# Patient Record
Sex: Male | Born: 1980 | Race: Black or African American | Hispanic: No | Marital: Single | Smoking: Current some day smoker
Health system: Southern US, Community
[De-identification: ages and names within clinical notes are randomized; demographics above are authoritative.]

## PROBLEM LIST (undated history)

## (undated) DIAGNOSIS — I10 Essential (primary) hypertension: Secondary | ICD-10-CM

## (undated) DIAGNOSIS — A539 Syphilis, unspecified: Secondary | ICD-10-CM

## (undated) DIAGNOSIS — B20 Human immunodeficiency virus [HIV] disease: Secondary | ICD-10-CM

## (undated) DIAGNOSIS — K6289 Other specified diseases of anus and rectum: Secondary | ICD-10-CM

## (undated) HISTORY — DX: Syphilis, unspecified: A53.9

---

## 1999-08-03 ENCOUNTER — Emergency Department (HOSPITAL_COMMUNITY): Admission: EM | Admit: 1999-08-03 | Discharge: 1999-08-03 | Payer: Self-pay | Admitting: Emergency Medicine

## 2004-05-13 ENCOUNTER — Emergency Department (HOSPITAL_COMMUNITY): Admission: EM | Admit: 2004-05-13 | Discharge: 2004-05-13 | Payer: Self-pay | Admitting: Emergency Medicine

## 2006-06-19 ENCOUNTER — Emergency Department (HOSPITAL_COMMUNITY): Admission: EM | Admit: 2006-06-19 | Discharge: 2006-06-19 | Payer: Self-pay | Admitting: Emergency Medicine

## 2006-06-20 ENCOUNTER — Emergency Department (HOSPITAL_COMMUNITY): Admission: EM | Admit: 2006-06-20 | Discharge: 2006-06-20 | Payer: Self-pay | Admitting: Emergency Medicine

## 2006-06-22 ENCOUNTER — Emergency Department (HOSPITAL_COMMUNITY): Admission: EM | Admit: 2006-06-22 | Discharge: 2006-06-22 | Payer: Self-pay | Admitting: Emergency Medicine

## 2006-08-12 ENCOUNTER — Emergency Department (HOSPITAL_COMMUNITY): Admission: EM | Admit: 2006-08-12 | Discharge: 2006-08-12 | Payer: Self-pay | Admitting: Emergency Medicine

## 2006-09-10 ENCOUNTER — Emergency Department (HOSPITAL_COMMUNITY): Admission: EM | Admit: 2006-09-10 | Discharge: 2006-09-10 | Payer: Self-pay | Admitting: Emergency Medicine

## 2006-12-14 ENCOUNTER — Emergency Department (HOSPITAL_COMMUNITY): Admission: EM | Admit: 2006-12-14 | Discharge: 2006-12-14 | Payer: Self-pay | Admitting: Emergency Medicine

## 2007-01-22 ENCOUNTER — Emergency Department (HOSPITAL_COMMUNITY): Admission: EM | Admit: 2007-01-22 | Discharge: 2007-01-22 | Payer: Self-pay | Admitting: Emergency Medicine

## 2007-06-06 ENCOUNTER — Emergency Department (HOSPITAL_COMMUNITY): Admission: EM | Admit: 2007-06-06 | Discharge: 2007-06-06 | Payer: Self-pay | Admitting: Emergency Medicine

## 2008-03-27 ENCOUNTER — Emergency Department (HOSPITAL_COMMUNITY): Admission: EM | Admit: 2008-03-27 | Discharge: 2008-03-27 | Payer: Self-pay | Admitting: Emergency Medicine

## 2009-02-17 ENCOUNTER — Ambulatory Visit: Payer: Self-pay | Admitting: Internal Medicine

## 2009-02-25 ENCOUNTER — Encounter: Payer: Self-pay | Admitting: Internal Medicine

## 2009-04-20 ENCOUNTER — Ambulatory Visit: Payer: Self-pay | Admitting: Internal Medicine

## 2009-04-20 LAB — CONVERTED CEMR LAB
ALT: 10 units/L (ref 0–53)
AST: 13 units/L (ref 0–37)
Absolute CD4: 816 #/uL (ref 381–1469)
Albumin: 3.8 g/dL (ref 3.5–5.2)
Alkaline Phosphatase: 48 units/L (ref 39–117)
BUN: 9 mg/dL (ref 6–23)
Basophils Absolute: 0 10*3/uL (ref 0.0–0.1)
Basophils Relative: 0 % (ref 0–1)
CD4 T Helper %: 31 % — ABNORMAL LOW (ref 32–62)
CO2: 29 meq/L (ref 19–32)
Calcium: 9.3 mg/dL (ref 8.4–10.5)
Chloride: 101 meq/L (ref 96–112)
Creatinine, Ser: 0.79 mg/dL (ref 0.40–1.50)
Eosinophils Absolute: 0.2 10*3/uL (ref 0.0–0.7)
Eosinophils Relative: 3 % (ref 0–5)
Glucose, Bld: 159 mg/dL — ABNORMAL HIGH (ref 70–99)
HCT: 44.4 % (ref 39.0–52.0)
HCV Ab: NEGATIVE
HIV 1 RNA Quant: 2480 copies/mL — ABNORMAL HIGH (ref <48)
HIV-1 RNA Quant, Log: 3.39 — ABNORMAL HIGH (ref <1.68)
Hemoglobin: 14.5 g/dL (ref 13.0–17.0)
Hep A IgM: NEGATIVE
Hep B S Ab: NEGATIVE
Lymphocytes Relative: 47 % — ABNORMAL HIGH (ref 12–46)
Lymphs Abs: 2.6 10*3/uL (ref 0.7–4.0)
MCHC: 32.7 g/dL (ref 30.0–36.0)
MCV: 87.6 fL (ref 78.0–100.0)
Monocytes Absolute: 0.7 10*3/uL (ref 0.1–1.0)
Monocytes Relative: 13 % — ABNORMAL HIGH (ref 3–12)
Neutro Abs: 2 10*3/uL (ref 1.7–7.7)
Neutrophils Relative %: 36 % — ABNORMAL LOW (ref 43–77)
Platelets: 259 10*3/uL (ref 150–400)
Potassium: 4 meq/L (ref 3.5–5.3)
RBC: 5.07 M/uL (ref 4.22–5.81)
RDW: 12.6 % (ref 11.5–15.5)
Sodium: 137 meq/L (ref 135–145)
Total Bilirubin: 0.4 mg/dL (ref 0.3–1.2)
Total Protein: 7.6 g/dL (ref 6.0–8.3)
Total lymphocyte count: 2632 cells/mcL (ref 700–3300)
WBC: 5.6 10*3/uL (ref 4.0–10.5)

## 2009-05-06 ENCOUNTER — Encounter: Payer: Self-pay | Admitting: Internal Medicine

## 2009-05-06 ENCOUNTER — Ambulatory Visit: Payer: Self-pay | Admitting: Internal Medicine

## 2009-05-06 DIAGNOSIS — F329 Major depressive disorder, single episode, unspecified: Secondary | ICD-10-CM | POA: Insufficient documentation

## 2009-05-06 DIAGNOSIS — E119 Type 2 diabetes mellitus without complications: Secondary | ICD-10-CM

## 2009-05-06 DIAGNOSIS — I10 Essential (primary) hypertension: Secondary | ICD-10-CM

## 2009-05-06 DIAGNOSIS — B20 Human immunodeficiency virus [HIV] disease: Secondary | ICD-10-CM

## 2009-05-06 LAB — CONVERTED CEMR LAB: Blood Glucose, Fingerstick: 292

## 2009-05-07 ENCOUNTER — Encounter: Payer: Self-pay | Admitting: Internal Medicine

## 2009-05-07 LAB — CONVERTED CEMR LAB: Hgb A1c MFr Bld: 8.6 %

## 2009-12-02 ENCOUNTER — Telehealth: Payer: Self-pay | Admitting: Internal Medicine

## 2010-03-15 ENCOUNTER — Encounter: Payer: Self-pay | Admitting: Internal Medicine

## 2010-03-15 ENCOUNTER — Ambulatory Visit
Admission: RE | Admit: 2010-03-15 | Discharge: 2010-03-15 | Payer: Self-pay | Source: Home / Self Care | Attending: Internal Medicine | Admitting: Internal Medicine

## 2010-03-15 ENCOUNTER — Telehealth (INDEPENDENT_AMBULATORY_CARE_PROVIDER_SITE_OTHER): Payer: Self-pay | Admitting: *Deleted

## 2010-03-15 ENCOUNTER — Emergency Department (HOSPITAL_COMMUNITY)
Admission: EM | Admit: 2010-03-15 | Discharge: 2010-03-15 | Payer: Self-pay | Source: Home / Self Care | Admitting: Emergency Medicine

## 2010-03-15 LAB — CONVERTED CEMR LAB
ALT: 16 units/L (ref 0–53)
AST: 10 units/L (ref 0–37)
Albumin: 3.9 g/dL (ref 3.5–5.2)
Alkaline Phosphatase: 60 units/L (ref 39–117)
BUN: 14 mg/dL (ref 6–23)
Bacteria, UA: NONE SEEN
Basophils Absolute: 0 10*3/uL (ref 0.0–0.1)
Basophils Relative: 1 % (ref 0–1)
Bilirubin Urine: NEGATIVE
Blood, UA: NEGATIVE
CO2: 27 meq/L (ref 19–32)
Calcium: 9.3 mg/dL (ref 8.4–10.5)
Casts: NONE SEEN /lpf
Chlamydia, Swab/Urine, PCR: NEGATIVE
Chloride: 99 meq/L (ref 96–112)
Cholesterol: 120 mg/dL (ref 0–200)
Creatinine, Ser: 0.86 mg/dL (ref 0.40–1.50)
Eosinophils Absolute: 0.2 10*3/uL (ref 0.0–0.7)
Eosinophils Relative: 3 % (ref 0–5)
GC Probe Amp, Urine: NEGATIVE
Glucose, Bld: 319 mg/dL — ABNORMAL HIGH (ref 70–99)
HCT: 41.9 % (ref 39.0–52.0)
HCV Ab: NEGATIVE
HDL: 26 mg/dL — ABNORMAL LOW (ref >39)
HIV 1 RNA Quant: 37000 copies/mL — ABNORMAL HIGH (ref <20)
HIV-1 RNA Quant, Log: 4.57 — ABNORMAL HIGH (ref <1.30)
Hemoglobin: 13.9 g/dL (ref 13.0–17.0)
Hep A Total Ab: NEGATIVE
Hep B Core Total Ab: NEGATIVE
Hep B S Ab: NEGATIVE
Hepatitis B Surface Ag: NEGATIVE
Hgb A1c MFr Bld: 9 % — ABNORMAL HIGH (ref <5.7)
Ketones, ur: NEGATIVE mg/dL
LDL Cholesterol: 67 mg/dL (ref 0–99)
Leukocytes, UA: NEGATIVE
Lymphocytes Relative: 38 % (ref 12–46)
Lymphs Abs: 2.2 10*3/uL (ref 0.7–4.0)
MCHC: 33.2 g/dL (ref 30.0–36.0)
MCV: 85.9 fL (ref 78.0–100.0)
Monocytes Absolute: 0.5 10*3/uL (ref 0.1–1.0)
Monocytes Relative: 9 % (ref 3–12)
Neutro Abs: 2.9 10*3/uL (ref 1.7–7.7)
Neutrophils Relative %: 50 % (ref 43–77)
Nitrite: NEGATIVE
Platelets: 352 10*3/uL (ref 150–400)
Potassium: 4.5 meq/L (ref 3.5–5.3)
Protein, ur: NEGATIVE mg/dL
RBC: 4.88 M/uL (ref 4.22–5.81)
RDW: 12.6 % (ref 11.5–15.5)
RPR Ser Ql: REACTIVE — AB
RPR Titer: 1:32 {titer} — AB
Sodium: 134 meq/L — ABNORMAL LOW (ref 135–145)
Specific Gravity, Urine: 1.043 — ABNORMAL HIGH (ref 1.005–1.030)
Squamous Epithelial / HPF: NONE SEEN /lpf
T pallidum Antibodies (TP-PA): 4.08 — ABNORMAL HIGH (ref <0.90)
Total Bilirubin: 0.3 mg/dL (ref 0.3–1.2)
Total CHOL/HDL Ratio: 4.6
Total Protein: 8.3 g/dL (ref 6.0–8.3)
Triglycerides: 137 mg/dL (ref <150)
Urine Glucose: >1000 mg/dL — AB
Urobilinogen, UA: 0.2 (ref 0.0–1.0)
VLDL: 27 mg/dL (ref 0–40)
WBC: 5.8 10*3/uL (ref 4.0–10.5)
pH: 5.5 (ref 5.0–8.0)

## 2010-03-24 ENCOUNTER — Ambulatory Visit: Admit: 2010-03-24 | Payer: Self-pay | Admitting: Internal Medicine

## 2010-03-29 ENCOUNTER — Ambulatory Visit
Admission: RE | Admit: 2010-03-29 | Discharge: 2010-03-29 | Payer: Self-pay | Source: Home / Self Care | Attending: Internal Medicine | Admitting: Internal Medicine

## 2010-03-29 DIAGNOSIS — A539 Syphilis, unspecified: Secondary | ICD-10-CM | POA: Insufficient documentation

## 2010-04-05 ENCOUNTER — Ambulatory Visit
Admission: RE | Admit: 2010-04-05 | Discharge: 2010-04-05 | Payer: Self-pay | Source: Home / Self Care | Attending: Adult Health | Admitting: Adult Health

## 2010-04-12 ENCOUNTER — Ambulatory Visit
Admission: RE | Admit: 2010-04-12 | Discharge: 2010-04-12 | Payer: Self-pay | Source: Home / Self Care | Attending: Adult Health | Admitting: Adult Health

## 2010-04-12 ENCOUNTER — Encounter (INDEPENDENT_AMBULATORY_CARE_PROVIDER_SITE_OTHER): Payer: Self-pay | Admitting: *Deleted

## 2010-04-12 NOTE — Miscellaneous (Signed)
Summary: Office Visit ([facility])    Vital Signs:  Patient profile:   30 year old male Weight:      294 pounds Temp:     97.9 degrees F oral Pulse rate:   98 / minute Pulse rhythm:   regular Resp:     18 per minute BP sitting:   145 / 85  (left arm)  Vitals Entered By: Sharen Heck RN (May 06, 2009 9:47 AM) CC: 05 patient new to Dr. Philipp Deputy Is Patient Diabetic? Yes Did you bring your meter with you today? No CBG Result 292  Does patient need assistance? Functional Status Self care Ambulation Normal   CC:  05 patient new to Dr. Philipp Deputy.  History of Present Illness: Pt here to get established.  He tested positive for HIV [facility] the health dept[facility] 10/10.  He had never been tested before. His risk factor is MSM. His partner at the time was tested but he does not know the reuslts and they have since broken up.  He was also told that he had DM when he went to the ED but has never been in care for it. He is currently not on treatment for DM and does not have a glucometer. He also c/o depression with suicidal thoughts - no plan to act. He has lost his job recently and then lost his partner.  He would like to get into counseling.  Preventive Screening-Counseling & Management  Alcohol-Tobacco     Alcohol drinks/day: 0     Smoking Status: current     Packs/Day: occasional     Passive Smoke Exposure: Yes  Caffeine-Diet-Exercise     Caffeine use/day: 10     Caffeine Counseling: decrease use of caffeine     Does Patient Exercise: yes     Type of exercise: walking     Exercise (avg: min/session): >60     Times/week: 7  Comments: trying to get set up for depression counseling  Current Problems (verified): 1)  Hypertension  (ICD-401.9) 2)  HIV Disease  (ICD-042) 3)  Diabetes Mellitus, Type II  (ICD-250.00) 4)  Depression  (ICD-311) 5)  HIV Infection, Asymptomatic  (ICD-V08)  Current Medications (verified): 1)  Glucophage 500 Mg Tabs (Metformin Hcl) .... Take 1 Tablet By  Mouth Two Times A Day 2)  Lisinopril 20 Mg Tabs (Lisinopril) .... Take 1 Tablet By Mouth Once A Day  Allergies (verified): No Known Drug Allergies  Past History:  Past Medical History: Depression Diabetes mellitus, type II HIV disease Hypertension   Review of Systems  The patient denies anorexia, fever, and weight loss.         no diarrhea or night sweats   Physical Exam  General:  alert, well-developed, well-nourished, and well-hydrated.   Head:  normocephalic and atraumatic.   Mouth:  pharynx pink and moist.   Lungs:  normal breath sounds.             Prevention For Positives: 05/06/2009   Safe sex practices discussed with patient. Condoms offered.                             Impression & Recommendations:  Problem # 1:  HIV DISEASE (ICD-042) Discussed pathophysiology of HIV and the meaning of CD4ct and VL.  Pt.s current Cd4ct is  816 and VL is  2480 .  At this Point antiretroviral treatment is not needed .  Discussed safe sex and transmisiion routes  with the patient. Pt will return in 3 months for repeat labs.  Diagnostics Reviewed:  WBC: 5.6 (04/20/2009)   Hgb: 14.5 (04/20/2009)   HCT: 44.4 (04/20/2009)   Platelets: 259 (04/20/2009) HIV genotype: See Comment (04/20/2009)   HIV-1 RNA: 2480 (04/20/2009)     Problem # 2:  HYPERTENSION (ICD-401.9) will start lisinopril. His updated medication list for this problem includes:    Lisinopril 20 Mg Tabs (Lisinopril) .Marland Kitchen... Take 1 tablet by mouth once a day  BP today: 145/85  Labs Reviewed: K+: 4.0 (04/20/2009) Creat: : 0.79 (04/20/2009)     Problem # 3:  DIABETES MELLITUS, TYPE II (ICD-250.00) Will refer to DM educator and give him an Rx for a glucometer.  Will start glucophage. His updated medication list for this problem includes:    Glucophage 500 Mg Tabs (Metformin hcl) .Marland Kitchen... Take 1 tablet by mouth two times a day    Lisinopril 20 Mg Tabs (Lisinopril) .Marland Kitchen... Take 1 tablet by mouth once a day  Problem  # 4:  DEPRESSION (ICD-311) will refer to our [facility] counselor.  Medications Added to Medication List This Visit: 1)  Glucophage 500 Mg Tabs (Metformin hcl) .... Take 1 tablet by mouth two times a day 2)  Lisinopril 20 Mg Tabs (Lisinopril) .... Take 1 tablet by mouth once a day    Patient Instructions: 1)  Please schedule a follow-up appointment in 2 weeks. 2)  Please schedule with DM Educator Prescriptions: LISINOPRIL 20 MG TABS (LISINOPRIL) Take 1 tablet by mouth once a day  #30 x 5   Entered and Authorized by:   Yisroel Ramming MD   Signed by:   Yisroel Ramming MD on 05/06/2009   Method used:   Print then Give to Patient   RxID:   [id number] GLUCOPHAGE 500 MG TABS (METFORMIN HCL) Take 1 tablet by mouth two times a day  #60 x 5   Entered and Authorized by:   Yisroel Ramming MD   Signed by:   Yisroel Ramming MD on 05/06/2009   Method used:   Print then Give to Patient   RxID:   [id number]

## 2010-04-12 NOTE — Miscellaneous (Signed)
Summary: vaccines  Clinical Lists Changes  Orders: Added new Service order of Influenza Vaccine NON MCR (53664) - Signed Added new Service order of Pneumococcal Vaccine (40347) - Signed Added new Service order of Admin 1st Vaccine (42595) - Signed Added new Service order of Admin 1st Vaccine [facility]) [phone]) - Signed Observations: Added new observation of PNEUMOVAXVIS: 10/09/95 version given May 07, 2009. (05/07/2009 17:51) Added new observation of PNEUMOVAXLOT: 1028Z (05/07/2009 17:51) Added new observation of PNEUMOVAXEXP: 04/09/2010 (05/07/2009 17:51) Added new observation of PNEUMOVAXBY: Sharen Heck RN (05/07/2009 17:51) Added new observation of PNEUMOVAXRTE: IM (05/07/2009 17:51) Added new observation of PNEUMOVAXMFR: Merck (05/07/2009 17:51) Added new observation of PNEUMOVAXSIT: right deltoid (05/07/2009 17:51) Added new observation of PNEUMOVAX: Pneumovax (05/07/2009 17:51) Added new observation of FLU VAX#1VIS: 10/04/06 version given May 07, 2009. (05/07/2009 17:51) Added new observation of FLU VAXLOT: [id number] (05/07/2009 17:51) Added new observation of FLU VAX EXP: 09/09/2009 (05/07/2009 17:51) Added new observation of FLU VAXBY: Sharen Heck RN (05/07/2009 17:51) Added new observation of FLU VAXRTE: IM (05/07/2009 17:51) Added new observation of FLU VAX DSE: 0.5 ml (05/07/2009 17:51) Added new observation of FLU VAXMFR: Sanofi Pasteur (05/07/2009 17:51) Added new observation of FLU VAX SITE: left deltoid (05/07/2009 17:51) Added new observation of FLU VAX: Fluvax Non-MCR (05/07/2009 17:51) Added new observation of HGBA1C: 8.6 % (05/07/2009 17:51)     Laboratory Results   Blood Tests   Date/Time Received: 05/06/09  HGBA1C: 8.6%   (Normal Range: Non-Diabetic - 3-6%   Control Diabetic - 6-8%)    Vaccines given 05/06/09.   Pneumovax Vaccine    Vaccine Type: Pneumovax    Site: right deltoid    Mfr: Merck    Dose: 0.5 ml    Route: IM    Given by:  Sharen Heck RN    Exp. Date: 04/09/2010    Lot #: [id number]    VIS given: 10/09/95 version given May 07, 2009.  Influenza Vaccine    Vaccine Type: Fluvax Non-MCR    Site: left deltoid    Mfr: Sanofi Pasteur    Dose: 0.5 ml    Route: IM    Given by: Sharen Heck RN    Exp. Date: 09/09/2009    Lot #: [id number]    VIS given: 10/04/06 version given May 07, 2009.  Flu Vaccine Consent Questions    Do you have a history of severe allergic reactions to this vaccine? no    Any prior history of allergic reactions to egg and/or gelatin? no    Do you have a sensitivity to the preservative Thimersol? no    Do you have a past history of Guillan-Barre Syndrome? no    Do you currently have an acute febrile illness? no    Have you ever had a severe reaction to latex? no    Vaccine information given and explained to patient? yes

## 2010-04-12 NOTE — Progress Notes (Signed)
Summary: "bump" on scalp x 3 days, needs labs, OV, PPD & vaccines   Phone Note Call from Patient   Reason for Call: Acute Illness Action Taken: Phone Call Completed, Appt Scheduled Summary of Call: Has recently moved back home with his parents in [place].  Out of care since early 2011.  C/O "bump" on the back of his scalp x 3 days which he has tried to open.  Stated that he has had similar places on his face.  Needs  042 labs and return OV w/ Dr. Philipp Deputy.  Needs PPD and vaccine review to update [employer] criteria.  Jennet Maduro RN  December 02, 2009 2:21 PM

## 2010-04-14 NOTE — Miscellaneous (Signed)
Summary: Orders Update  Clinical Lists Changes  Orders: Added new Test order of T- Hemoglobin A1C (09811-91478) - Signed  Appended Document: Orders Update    Clinical Lists Changes  Orders: Added new Test order of T-Chlamydia  Probe, urine [phone]) - Signed Added new Test order of T-CBC w/Diff [phone]) - Signed Added new Test order of T-CD4SP [facility]) (CD4SP) - Signed Added new Test order of T-GC Probe, urine [phone]) - Signed Added new Test order of T-Comprehensive Metabolic Panel [phone]) - Signed Added new Test order of T-Hepatitis B Surface Antigen [phone]) - Signed Added new Test order of T-Hepatitis B Surface Antibody [phone]) - Signed Added new Test order of T-Hepatitis B Core Antibody ([id number]) - Signed Added new Test order of T-Hepatitis C Antibody (30160-10932) - Signed Added new Test order of T-Hepatitis A Antibody (35573-22025) - Signed Added new Test order of T-HIV1 Quant rflx Ultra or Genotype (42706-23762) - Signed Added new Test order of T-Lipid Profile (83151-76160) - Signed Added new Test order of T-RPR (Syphilis) (73710-62694) - Signed Added new Test order of T-Urinalysis (85462-70350) - Signed

## 2010-04-14 NOTE — Miscellaneous (Signed)
Summary: Lab Update  Clinical Lists Changes  Orders: Added new Test order of T-Chlamydia  Probe, urine [phone]) - Signed Added new Test order of T-CBC w/Diff [phone]) - Signed Added new Test order of T-CD4SP [facility]) (CD4SP) - Signed Added new Test order of T-GC Probe, urine [phone]) - Signed Added new Test order of T-Comprehensive Metabolic Panel [phone]) - Signed Added new Test order of T-Hepatitis B Surface Antigen [phone]) - Signed Added new Test order of T-Hepatitis B Surface Antibody ([phone]) - Signed Added new Test order of T-Hepatitis B Core Antibody ([id number]) - Signed Added new Test order of T-Hepatitis C Antibody (95188-41660) - Signed Added new Test order of T-Hepatitis A Antibody (63016-01093) - Signed Added new Test order of T-HIV1 Quant rflx Ultra or Genotype (23557-32202) - Signed Added new Test order of T-Lipid Profile (54270-62376) - Signed Added new Test order of T-RPR (Syphilis) (28315-17616) - Signed Added new Test order of T-Urinalysis (07371-06269) - Signed

## 2010-04-14 NOTE — Assessment & Plan Note (Signed)
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)    Route: IM    Site: RUOQ gluteus    Exp Date: 09/10/2012    Lot #: [id number]    Mfr: king    Patient tolerated injection without complications    Given by: [name] CMA Duncan Dull) (April 05, 2010 11:16 AM)  Injection # 2:    Medication: Bicillin LA 1.2 million units Injection    

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
## 2010-04-14 NOTE — Progress Notes (Signed)
Summary: Pt. diagnosed with Bells Palsy  Phone Note Call from Patient   Caller: Patient Summary of Call: Pt. came in for labs and the side of his face was drooping.  Tomasita Morrow, RN spoke with pt. and suggested he go to the ED to be evaluated for possible stroke.  Pt. called to notify he went to ED and was given dx of Bells Palsy and given medication.  Pt. has followup appt. with Dr. Philipp Deputy for 03/29/10. Initial call taken by: [name] CMA Duncan Dull),  March 15, 2010 4:27 PM

## 2010-04-20 NOTE — Miscellaneous (Signed)
Summary: RW Financial Update   Clinical Lists Changes  Observations: Added new observation of AIDSDAP: No-not currrently on hiv meds (04/12/2010 16:41) Added new observation of PCTFPL: 0  (04/12/2010 16:41) Added new observation of INCOMESOURCE: none  (04/12/2010 16:41) Added new observation of HOUSEINCOME: 0  (04/12/2010 16:41) Added new observation of FAMILYSIZE: 1  (04/12/2010 16:41) Added new observation of FINASSESSDT: 04/12/2010  (04/12/2010 16:41) Added new observation of YEARLYEXPEN: 0  (04/12/2010 16:41)

## 2010-05-23 LAB — T-HELPER CELL (CD4) - (RCID CLINIC ONLY)
CD4 % Helper T Cell: 27 % — ABNORMAL LOW (ref 33–55)
CD4 T Cell Abs: 620 uL (ref 400–2700)

## 2010-06-27 LAB — POCT I-STAT, CHEM 8
BUN: 7 mg/dL (ref 6–23)
Calcium, Ion: 1.15 mmol/L (ref 1.12–1.32)
Chloride: 98 mEq/L (ref 96–112)
Creatinine, Ser: 0.9 mg/dL (ref 0.4–1.5)
Glucose, Bld: 349 mg/dL — ABNORMAL HIGH (ref 70–99)
HCT: 47 % (ref 39.0–52.0)
Hemoglobin: 16 g/dL (ref 13.0–17.0)
Potassium: 3.9 mEq/L (ref 3.5–5.1)
Sodium: 139 mEq/L (ref 135–145)
TCO2: 29 mmol/L (ref 0–100)

## 2010-06-27 LAB — MONONUCLEOSIS SCREEN: Mono Screen: NEGATIVE

## 2010-06-27 LAB — STREP A DNA PROBE: Group A Strep Probe: NEGATIVE

## 2010-06-27 LAB — RAPID STREP SCREEN (MED CTR MEBANE ONLY): Streptococcus, Group A Screen (Direct): NEGATIVE

## 2010-07-29 NOTE — Consult Note (Signed)
NAMEPERCELL, LAMBOY              ACCOUNT NO.:  [id number]   MEDICAL RECORD NO.:  [id number]          PATIENT TYPE:  EMS   LOCATION:  [facility]                         FACILITY:  [facility]   PHYSICIAN:  Kristine Garbe. Ezzard Standing, M.D.DATE OF BIRTH:  Apr 12, 1980   DATE OF CONSULTATION:  06/20/2006  DATE OF DISCHARGE:                                 CONSULTATION   ER CONSULTATION   REASON FOR CONSULTATION:  Evaluate the patient with neck cellulitis,  questionable abscess.   BRIEF HISTORY AND PHYSICAL:  Alan Boyle is a 30 year old obese  gentleman who developed inflammation and soreness of the neck yesterday  and was started on doxycycline.  A CT scan yesterday showed mostly  subcutaneous cellulitis at the submental left mandibular region.  He  comes back today for recheck having the submental area is doing better,  but the left submandibular area is doing a little bit worse.   On examination, he has an open drain wound in the submental area  consistent with a draining abscess cellulitis in the left submental  area.  The left submandibular area is not opened and draining but is  indurated and tender.   IMPRESSION:  Cellulitis pearly subcutaneous abscess.   RECOMMENDATIONS:  Recommended an antibiotic and warm compresses.  Obtained a culture of the submental draining area in the ER today.  In  addition to doxycycline, will start him on Keflex 500 mg q.i.d.  Will  followup 48 hours for a recheck.           ______________________________  Kristine Garbe. Ezzard Standing, M.D.     CEN/MEDQ  D:  06/20/2006  T:  06/20/2006  Job:  [id number]

## 2010-12-20 LAB — CBC
HCT: 45.4
Hemoglobin: 15
MCHC: 33.1
MCV: 87.3
Platelets: 294
RBC: 5.2
RDW: 13.5
WBC: 8.9

## 2010-12-20 LAB — I-STAT 8, (EC8 V) (CONVERTED LAB)
Acid-Base Excess: 1
BUN: 9
Bicarbonate: 26.8 — ABNORMAL HIGH
Chloride: 101
Glucose, Bld: 239 — ABNORMAL HIGH
HCT: 49
Hemoglobin: 16.7
Operator id: 272551
Potassium: 3.9
Sodium: 137
TCO2: 28
pCO2, Ven: 46.9
pH, Ven: 7.366 — ABNORMAL HIGH

## 2010-12-20 LAB — DIFFERENTIAL
Basophils Absolute: 0
Basophils Relative: 0
Eosinophils Absolute: 0.3
Eosinophils Relative: 3
Lymphocytes Relative: 34
Lymphs Abs: 3
Monocytes Absolute: 0.8
Monocytes Relative: 9
Neutro Abs: 4.8
Neutrophils Relative %: 54

## 2010-12-20 LAB — POCT I-STAT CREATININE
Creatinine, Ser: 0.8
Operator id: 272551

## 2010-12-22 LAB — I-STAT 8, (EC8 V) (CONVERTED LAB)
Acid-Base Excess: 3 — ABNORMAL HIGH
BUN: 8
Bicarbonate: 27.8 — ABNORMAL HIGH
Chloride: 100
Glucose, Bld: 278 — ABNORMAL HIGH
HCT: 46
Hemoglobin: 15.6
Operator id: 277751
Potassium: 3.7
Sodium: 135
TCO2: 29
pCO2, Ven: 41.4 — ABNORMAL LOW
pH, Ven: 7.434 — ABNORMAL HIGH

## 2010-12-22 LAB — URINALYSIS, ROUTINE W REFLEX MICROSCOPIC
Bilirubin Urine: NEGATIVE
Glucose, UA: >1000 — AB
Hgb urine dipstick: NEGATIVE
Ketones, ur: 15 — AB
Leukocytes, UA: NEGATIVE
Nitrite: NEGATIVE
Protein, ur: NEGATIVE
Specific Gravity, Urine: 1.028
Urobilinogen, UA: 1
pH: 6.5

## 2010-12-22 LAB — URINE MICROSCOPIC-ADD ON

## 2010-12-22 LAB — POCT I-STAT CREATININE
Creatinine, Ser: 0.9
Operator id: 277751

## 2010-12-29 LAB — I-STAT 8, (EC8 V) (CONVERTED LAB)
Acid-Base Excess: 2
BUN: 5 — ABNORMAL LOW
Bicarbonate: 27.9 — ABNORMAL HIGH
Chloride: 103
Glucose, Bld: 227 — ABNORMAL HIGH
HCT: 45
Hemoglobin: 15.3
Operator id: 196461
Potassium: 3.7
Sodium: 139
TCO2: 29
pCO2, Ven: 45.7
pH, Ven: 7.393 — ABNORMAL HIGH

## 2010-12-29 LAB — POCT I-STAT CREATININE
Creatinine, Ser: 0.8
Operator id: 196461

## 2011-07-30 ENCOUNTER — Encounter (HOSPITAL_COMMUNITY): Payer: Self-pay | Admitting: Emergency Medicine

## 2011-07-30 ENCOUNTER — Emergency Department (HOSPITAL_COMMUNITY): Payer: Self-pay

## 2011-07-30 ENCOUNTER — Emergency Department (HOSPITAL_COMMUNITY)
Admission: EM | Admit: 2011-07-30 | Discharge: 2011-07-30 | Disposition: A | Discharge status: Home / Self Care | Payer: Self-pay | Attending: Emergency Medicine | Admitting: Emergency Medicine

## 2011-07-30 DIAGNOSIS — K625 Hemorrhage of anus and rectum: Secondary | ICD-10-CM | POA: Insufficient documentation

## 2011-07-30 DIAGNOSIS — F17200 Nicotine dependence, unspecified, uncomplicated: Secondary | ICD-10-CM | POA: Insufficient documentation

## 2011-07-30 DIAGNOSIS — R195 Other fecal abnormalities: Secondary | ICD-10-CM | POA: Insufficient documentation

## 2011-07-30 DIAGNOSIS — R1033 Periumbilical pain: Secondary | ICD-10-CM | POA: Insufficient documentation

## 2011-07-30 DIAGNOSIS — E119 Type 2 diabetes mellitus without complications: Secondary | ICD-10-CM | POA: Insufficient documentation

## 2011-07-30 DIAGNOSIS — R10819 Abdominal tenderness, unspecified site: Secondary | ICD-10-CM | POA: Insufficient documentation

## 2011-07-30 DIAGNOSIS — I10 Essential (primary) hypertension: Secondary | ICD-10-CM | POA: Insufficient documentation

## 2011-07-30 DIAGNOSIS — K644 Residual hemorrhoidal skin tags: Secondary | ICD-10-CM | POA: Insufficient documentation

## 2011-07-30 DIAGNOSIS — R10811 Right upper quadrant abdominal tenderness: Secondary | ICD-10-CM | POA: Insufficient documentation

## 2011-07-30 DIAGNOSIS — R10814 Left lower quadrant abdominal tenderness: Secondary | ICD-10-CM | POA: Insufficient documentation

## 2011-07-30 HISTORY — DX: Essential (primary) hypertension: I10

## 2011-07-30 LAB — DIFFERENTIAL
Basophils Absolute: 0 10*3/uL (ref 0.0–0.1)
Eosinophils Absolute: 0.2 10*3/uL (ref 0.0–0.7)
Lymphocytes Relative: 25 % (ref 12–46)
Lymphs Abs: 1.9 10*3/uL (ref 0.7–4.0)
Neutro Abs: 4.4 10*3/uL (ref 1.7–7.7)

## 2011-07-30 LAB — CBC
HCT: 39.9 % (ref 39.0–52.0)
Hemoglobin: 14 g/dL (ref 13.0–17.0)
MCH: 30 pg (ref 26.0–34.0)
MCHC: 35.1 g/dL (ref 30.0–36.0)
MCV: 85.4 fL (ref 78.0–100.0)
Platelets: 227 10*3/uL (ref 150–400)
RBC: 4.67 MIL/uL (ref 4.22–5.81)
RDW: 12.8 % (ref 11.5–15.5)
WBC: 7.5 10*3/uL (ref 4.0–10.5)

## 2011-07-30 LAB — COMPREHENSIVE METABOLIC PANEL
AST: 22 U/L (ref 0–37)
Albumin: 3.3 g/dL — ABNORMAL LOW (ref 3.5–5.2)
BUN: 4 mg/dL — ABNORMAL LOW (ref 6–23)
Creatinine, Ser: 0.73 mg/dL (ref 0.50–1.35)
Total Protein: 7.6 g/dL (ref 6.0–8.3)

## 2011-07-30 LAB — OCCULT BLOOD, POC DEVICE: Fecal Occult Bld: POSITIVE

## 2011-07-30 MED ORDER — ONDANSETRON HCL 4 MG/2ML IJ SOLN
4.0000 mg | Freq: Once | INTRAMUSCULAR | Status: AC
  Administered 2011-07-30: 4 mg via INTRAVENOUS
  Filled 2011-07-30: qty 2

## 2011-07-30 MED ORDER — DOCUSATE SODIUM 100 MG PO CAPS: 100.0000 mg | ORAL_CAPSULE | Freq: Two times a day (BID) | ORAL | Status: AC

## 2011-07-30 MED ORDER — MORPHINE SULFATE 4 MG/ML IJ SOLN
4.0000 mg | Freq: Once | INTRAMUSCULAR | Status: AC
  Administered 2011-07-30: 4 mg via INTRAVENOUS
  Filled 2011-07-30: qty 1

## 2011-07-30 MED ORDER — HYDROCODONE-ACETAMINOPHEN 5-325 MG PO TABS: ORAL_TABLET | ORAL | Status: AC

## 2011-07-30 NOTE — ED Notes (Signed)
Per PTAR, patient complaining of hemorrhoids for the past several days.  Patient reports "poking" hemorrhoids back in tonight when his rectal bleeding began; called EMS.  Patient has history of hypertension; states that he does not take any medications because he cannot afford them.  Patient ambulatory; alert and oriented x4.

## 2011-07-30 NOTE — Discharge Instructions (Signed)
Please read and follow all provided instructions.  Your diagnoses today include:  1. Rectal bleeding    Tests performed today include:  Blood counts and electrolytes  Blood tests to check liver and kidney function  X-ray which was negative  Vital signs. See below for your results today.   Medications prescribed:   Vicodin (hydrocodone/acetaminophen) - narcotic pain medication  You have been prescribed narcotic pain medication such as Vicodin or Percocet: DO NOT drive or perform any activities that require you to be awake and alert because this medicine can make you drowsy. BE VERY CAREFUL not to take multiple medicines containing Tylenol (also called acetaminophen). Doing so can lead to an overdose which can damage your liver and cause liver failure and possibly death.    Colace - stool softener  Take any prescribed medications only as directed.  Home care instructions:   Follow any educational materials contained in this packet.  Discontinue enemas - these can cause cuts to the inside of your rectum  Follow-up instructions: Please follow-up with the gastroenterologist referral in the next 2 days for further evaluation of your symptoms. If you do not have a primary care doctor -- see below for referral information.   Return instructions:  SEEK IMMEDIATE MEDICAL ATTENTION IF:  The pain does not go away or becomes severe   A temperature above 101F develops   Repeated vomiting occurs (multiple episodes)   The pain becomes localized to portions of the abdomen. The right side could possibly be appendicitis. In an adult, the left lower portion of the abdomen could be colitis or diverticulitis.   Increasing amount of blood is being passed in stools or vomit (bright red or black tarry stools)   You develop chest pain, difficulty breathing, dizziness or fainting, or become confused, poorly responsive, or inconsolable (young children)  If you have any other emergent concerns  regarding your health  Additional Information: Abdominal (belly) pain can be caused by many things. Your caregiver performed an examination and possibly ordered blood/urine tests and imaging (CT scan, x-rays, ultrasound). Many cases can be observed and treated at home after initial evaluation in the emergency department. Even though you are being discharged home, abdominal pain can be unpredictable. Therefore, you need a repeated exam if your pain does not resolve, returns, or worsens. Most patients with abdominal pain don't have to be admitted to the hospital or have surgery, but serious problems like appendicitis and gallbladder attacks can start out as nonspecific pain. Many abdominal conditions cannot be diagnosed in one visit, so follow-up evaluations are very important.  Your vital signs today were: BP 170/100  Pulse 87  Temp(Src) 98.3 F (36.8 C) (Oral)  Resp 18  SpO2 98% If your blood pressure (bp) was elevated above 135/85 this visit, please have this repeated by your doctor within one month. -------------- No Primary Care Doctor Call [facility]  [phone] Other agencies that provide inexpensive medical care    [facility]  [phone]    [facility]  [phone]    [facility]  [phone]    [facility]  [phone]    [facility]  [phone]    [facility]  [phone] -------------- RESOURCE GUIDE:  Dental Problems  Patients with Medicaid: [facility] [phone] [address]  [address] Phone:  [phone]                                                      Phone:  [phone]  If unable to pay or uninsured, contact:  [facility] or [facility]. to become qualified for the adult dental clinic.  Chronic Pain Problems Contact [facility]  [phone] Patients need to be referred by their primary  care doctor.  Insufficient Money for Medicine Contact [employer]:  call "211" or [facility] [phone].  Psychological Services [facility]  [phone] [facility]  [phone] [facility]   [phone] (emergency services [phone])  Substance Abuse Resources [facility]  [phone] [facility] [phone] [facility] [place] [phone] Floydene Flock [phone] Residential & Outpatient Substance Abuse Program  [phone]  Abuse/Neglect [facility] Child Abuse Hotline 313-401-2197 [facility] Child Abuse Hotline [phone]) [phone] (After Hours)  Emergency Shelter [facility] [phone]  Maternity Homes Room at the [place] [facility] [phone] [place] [facility] [phone]  [name] Resources  [facility]     [employer]                          [facility] Dept. [address] [place]                       [address]      [address]  Blondell Reveal Phone:  [phone]                                   Phone:  [phone]                 Phone:  [phone]  [facility] Phone:  [phone]  [facility] Child Abuse Hotline [phone] (After Hours)

## 2011-07-30 NOTE — ED Provider Notes (Signed)
History     CSN: [id number]  Arrival date & time 07/30/11  8469   First MD Initiated Contact with Patient 07/30/11 0054      Chief Complaint  Patient presents with  . Rectal Bleeding    (Consider location/radiation/quality/duration/timing/severity/associated sxs/prior treatment) HPI Comments: Patient with h/o DM and HTN -- presents with rectal bleeding, crampy lower abd pain, and rectal pain since this morning. Patient is homosexual and has anal receptive sex. He also participates in enemas with insertion of plastic piece into rectum to cleanse with water for hygiene purposes. Last anal sex was 2 days ago. Last enema was this morning. Onset of symptoms after enema this AM. Patient reports passage of dark red blood, approximately 4 tablespoons, with bowel movements today. No N/V/D, fever. Nothing makes symptoms better or worse. Pain is constant.   Patient is a 31 y.o. male presenting with hematochezia. The history is provided by the patient.  Rectal Bleeding  The current episode started today. The onset was sudden. The problem has been unchanged. The pain is moderate. The stool is described as mixed with blood. Associated symptoms include abdominal pain, hemorrhoids and rectal pain. Pertinent negatives include no fever, no diarrhea, no nausea, no vomiting, no hematuria, no chest pain, no headaches, no coughing and no rash.    Past Medical History  Diagnosis Date  . Hypertension     History reviewed. No pertinent past surgical history.  History reviewed. No pertinent family history.  History  Substance Use Topics  . Smoking status: Current Everyday Smoker  . Smokeless tobacco: Not on file  . Alcohol Use: No      Review of Systems  Constitutional: Negative for fever.  HENT: Negative for sore throat and rhinorrhea.   Eyes: Negative for redness.  Respiratory: Negative for cough.   Cardiovascular: Negative for chest pain.  Gastrointestinal: Positive for abdominal pain, blood  in stool, hematochezia, anal bleeding, rectal pain and hemorrhoids. Negative for nausea, vomiting, diarrhea and constipation.  Genitourinary: Negative for dysuria and hematuria.  Musculoskeletal: Negative for myalgias.  Skin: Negative for rash.  Neurological: Negative for headaches.    Allergies  Review of patient's allergies indicates no known allergies.  Home Medications   Current Outpatient Rx  Name Route Sig Dispense Refill  . BISMUTH SUBSALICYLATE 262 MG/15ML PO SUSP Oral Take 15 mLs by mouth every 6 (six) hours as needed. For upset stomach      BP 163/100  Pulse 98  Temp(Src) 98.1 F (36.7 C) (Oral)  Resp 20  SpO2 97%  Physical Exam  Nursing note and vitals reviewed. Constitutional: He appears well-developed and well-nourished.  HENT:  Head: Normocephalic and atraumatic.  Eyes: Conjunctivae are normal. Right eye exhibits no discharge. Left eye exhibits no discharge.  Neck: Normal range of motion. Neck supple.  Cardiovascular: Normal rate, regular rhythm and normal heart sounds.   Pulmonary/Chest: Effort normal and breath sounds normal.  Abdominal: Soft. Bowel sounds are normal. He exhibits no distension. There is tenderness in the right lower quadrant, suprapubic area and left lower quadrant. There is no rebound, no guarding, no CVA tenderness, no tenderness at McBurney's point and negative Murphy's sign.       Very mild tenderness diffusely across lower abdomen without rebound or guarding. Patient does not grimace on exam but verbalizes tenderness.   Genitourinary: Rectal exam shows external hemorrhoid and tenderness. Rectal exam shows no internal hemorrhoid and anal tone normal. Guaiac positive stool.     Neurological: He is alert.  Skin: Skin is warm and dry.  Psychiatric: He has a normal mood and affect.    ED Course  Procedures (including critical care time)  Labs Reviewed  DIFFERENTIAL - Abnormal; Notable for the following:    Monocytes Relative 13 (*)     All other components within normal limits  COMPREHENSIVE METABOLIC PANEL - Abnormal; Notable for the following:    Potassium 3.1 (*)    Glucose, Bld 126 (*)    BUN 4 (*)    Albumin 3.3 (*)    All other components within normal limits  OCCULT BLOOD, POC DEVICE  CBC   Dg Abd Acute W/chest  07/30/2011  *RADIOLOGY REPORT*  Clinical Data: Periumbilical abdominal pain  ACUTE ABDOMEN SERIES (ABDOMEN 2 VIEW & CHEST 1 VIEW)  Comparison: None.  Findings: Lungs are clear. No pleural effusion or pneumothorax. The cardiomediastinal contours are within normal limits. The visualized bones and soft tissues are without significant appreciable abnormality.  Nonobstructive bowel gas pattern.  Organ outlines normal where seen.  No free intraperitoneal air.  No acute osseous finding.  IMPRESSION: Nonobstructive bowel gas pattern.  Clear lungs.  Original Report Authenticated By: Waneta Martins, M.D.     1. Rectal bleeding     1:22 AM Patient seen and examined. Work-up initiated. Discussed with Dr. Judd Lien. Work-up pending.   Vital signs reviewed and are as follows: Filed Vitals:   07/30/11 0039  BP: 163/100  Pulse: 98  Temp: 98.1 F (36.7 C)  Resp: 20   4:08 AM Patient seen by Dr. Judd Lien. Labs, x-ray reviewed and are normal. Pain improved. Exam: no abdominal pain to palpation. No rebound or guarding. No active bleeding in ED.   Will d/c to home with GI referral. Patient told to stop using enemas as this is likely causing small lacerations that are bleeding. Will d/c with pain medicine and stool softeners.   The patient was urged to return to the Emergency Department immediately with worsening of current symptoms, worsening abdominal pain, persistent vomiting, increasing amount of blood noted in stools, fever, or any other concerns. The patient verbalized understanding.   Patient counseled on use of narcotic pain medications. Counseled not to combine these medications with others containing tylenol. Urged  not to drink alcohol, drive, or perform any other activities that requires focus while taking these medications. The patient verbalizes understanding and agrees with the plan.     MDM  Rectal bleeding -- likely 2/2 lacerations sustained when patient inserted enema tube. Do not suspect bowel perforation given mild symptoms and benign abdominal exam. Labs show normal hemoglobin and vital signs do not show significant blood loss. No rectal bleeding in ED -- only small amount of blood demonstrated on exam. Patient appears well and is agreeable to discharge home.  Strict return instructions given.         [place], [place] 07/30/11 [phone]

## 2011-07-30 NOTE — ED Notes (Signed)
Josh, PA updated re: pt request for pain med.

## 2011-07-31 DIAGNOSIS — B20 Human immunodeficiency virus [HIV] disease: Secondary | ICD-10-CM

## 2011-07-31 DIAGNOSIS — Z113 Encounter for screening for infections with a predominantly sexual mode of transmission: Secondary | ICD-10-CM

## 2011-07-31 DIAGNOSIS — Z79899 Other long term (current) drug therapy: Secondary | ICD-10-CM

## 2011-08-01 ENCOUNTER — Other Ambulatory Visit (INDEPENDENT_AMBULATORY_CARE_PROVIDER_SITE_OTHER): Payer: Self-pay

## 2011-08-01 ENCOUNTER — Other Ambulatory Visit: Payer: Self-pay | Admitting: *Deleted

## 2011-08-01 DIAGNOSIS — Z113 Encounter for screening for infections with a predominantly sexual mode of transmission: Secondary | ICD-10-CM

## 2011-08-01 DIAGNOSIS — Z79899 Other long term (current) drug therapy: Secondary | ICD-10-CM

## 2011-08-01 DIAGNOSIS — B20 Human immunodeficiency virus [HIV] disease: Secondary | ICD-10-CM

## 2011-08-01 LAB — COMPLETE METABOLIC PANEL WITH GFR
Albumin: 3.5 g/dL (ref 3.5–5.2)
Alkaline Phosphatase: 43 U/L (ref 39–117)
CO2: 33 mEq/L — ABNORMAL HIGH (ref 19–32)
Chloride: 103 mEq/L (ref 96–112)
GFR, Est Non African American: >89 mL/min
Glucose, Bld: 93 mg/dL (ref 70–99)
Potassium: 3.4 mEq/L — ABNORMAL LOW (ref 3.5–5.3)
Sodium: 141 mEq/L (ref 135–145)
Total Protein: 7 g/dL (ref 6.0–8.3)

## 2011-08-01 LAB — LIPID PANEL
HDL: 29 mg/dL — ABNORMAL LOW (ref >39)
LDL Cholesterol: 54 mg/dL (ref 0–99)

## 2011-08-01 NOTE — ED Provider Notes (Signed)
Medical screening examination/treatment/procedure(s) were conducted as a shared visit with non-physician practitioner(s) and myself.  I personally evaluated the patient during the encounter.  I saw the patient with Alan Boyle and agree with his note, assessment, and plan.  The patient is a 31 year old homosexual male who presents to the ER complaining of rectal bleeding.  He reports having frequent receptive anal sex with his partner and performs anal "douching" before most encounters.  He denies rectal pain, constipation.  He has had several slightly bloody bowel movements.  On exam, vitals are stable and the patient is afebrile.  The rectal exam reveals no active bleeding and no obvious tears or hemorrhoids are visible.  The labs have returned and are basically unremarkable.  The Hb is stable.  He will be discharged to home with instructions to follow up with GI to discuss colonoscopy/sigmoidoscopy and return as needed if worsens.  Geoffery Lyons, MD 08/01/11 [phone]

## 2011-08-02 LAB — HIV-1 RNA QUANT-NO REFLEX-BLD
HIV 1 RNA Quant: 7661 copies/mL — ABNORMAL HIGH (ref <20)
HIV-1 RNA Quant, Log: 3.88 {Log} — ABNORMAL HIGH (ref <1.30)

## 2011-08-02 LAB — CBC WITH DIFFERENTIAL/PLATELET
Basophils Relative: 6 % — ABNORMAL HIGH (ref 0–1)
Hemoglobin: 14.2 g/dL (ref 13.0–17.0)
Lymphocytes Relative: 48 % — ABNORMAL HIGH (ref 12–46)
Lymphs Abs: 3.1 10*3/uL (ref 0.7–4.0)
Monocytes Relative: 18 % — ABNORMAL HIGH (ref 3–12)
Neutro Abs: 1.6 10*3/uL — ABNORMAL LOW (ref 1.7–7.7)
Neutrophils Relative %: 25 % — ABNORMAL LOW (ref 43–77)
RBC: 4.89 MIL/uL (ref 4.22–5.81)
WBC: 3.4 10*3/uL — ABNORMAL LOW (ref 4.0–10.5)

## 2011-08-02 LAB — T-HELPER CELL (CD4) - (RCID CLINIC ONLY): CD4 T Cell Abs: 790 uL (ref 400–2700)

## 2011-08-15 ENCOUNTER — Ambulatory Visit: Payer: Self-pay

## 2011-08-15 ENCOUNTER — Ambulatory Visit (INDEPENDENT_AMBULATORY_CARE_PROVIDER_SITE_OTHER): Payer: Self-pay | Admitting: Internal Medicine

## 2011-08-15 ENCOUNTER — Encounter: Payer: Self-pay | Admitting: Internal Medicine

## 2011-08-15 ENCOUNTER — Other Ambulatory Visit: Payer: Self-pay | Admitting: *Deleted

## 2011-08-15 VITALS — BP 170/108 | HR 97 | Temp 97.5°F | Ht 74.0 in | Wt 239.0 lb

## 2011-08-15 DIAGNOSIS — Z23 Encounter for immunization: Secondary | ICD-10-CM

## 2011-08-15 DIAGNOSIS — E669 Obesity, unspecified: Secondary | ICD-10-CM | POA: Insufficient documentation

## 2011-08-15 DIAGNOSIS — I10 Essential (primary) hypertension: Secondary | ICD-10-CM

## 2011-08-15 DIAGNOSIS — B20 Human immunodeficiency virus [HIV] disease: Secondary | ICD-10-CM

## 2011-08-15 DIAGNOSIS — K649 Unspecified hemorrhoids: Secondary | ICD-10-CM

## 2011-08-15 DIAGNOSIS — E119 Type 2 diabetes mellitus without complications: Secondary | ICD-10-CM

## 2011-08-15 MED ORDER — EFAVIRENZ-EMTRICITAB-TENOFOVIR 600-200-300 MG PO TABS: 1.0000 | ORAL_TABLET | Freq: Every day | ORAL | Status: DC

## 2011-08-15 NOTE — Assessment & Plan Note (Addendum)
I have discussed treatment options with him and he will start Atripla.  I discussed good long term prognosis with treatment and side effects such as cholesterol, liver and kidney dysfucntion that can occur with meds.  Knows to take on an empty stomach.  Reminded to always use condoms.

## 2011-08-15 NOTE — Assessment & Plan Note (Signed)
Encouraged exercise.  

## 2011-08-15 NOTE — Assessment & Plan Note (Signed)
Most recent blood sugar was ok.  Will send to IM clinic.

## 2011-08-15 NOTE — Assessment & Plan Note (Signed)
Elevated.  Will send to IM clinic for primary issues.

## 2011-08-15 NOTE — Progress Notes (Signed)
  Subjective:    Patient ID: Alan Boyle, male    DOB: [birth year], 31 y.o.   MRN: [id number]  HPI Here for follow up of HIV.  Has been out of care but is interested in reestablishing.  He has remained ARV naive due to not wanting to start but is concerned with fatigue and other issues and now is interested.  No weight loss.  Does have DM and HTN but has been off meds for lack of follow up.  Hx of syphilis.     Review of Systems  Constitutional: Negative for fever, appetite change and fatigue.  HENT: Negative for sore throat and trouble swallowing.   Respiratory: Negative for cough, shortness of breath and wheezing.   Cardiovascular: Negative for chest pain, palpitations and leg swelling.  Gastrointestinal: Negative for nausea, abdominal pain, diarrhea and constipation.  Genitourinary:       + hemorrohoids  Musculoskeletal: Negative for myalgias, back pain and joint swelling.  Skin: Negative for pallor and rash.  Neurological: Negative for dizziness, weakness and headaches.  Hematological: Negative for adenopathy.  Psychiatric/Behavioral: Negative for dysphoric mood. The patient is not nervous/anxious.        Objective:   Physical Exam  Constitutional: He appears well-developed and well-nourished. No distress.       obese  HENT:  Mouth/Throat: Oropharynx is clear and moist. No oropharyngeal exudate.  Cardiovascular: Normal rate and regular rhythm.  Exam reveals no gallop and no friction rub.   No murmur heard. Pulmonary/Chest: Effort normal and breath sounds normal. No respiratory distress. He has no wheezes. He has no rales.  Abdominal: Soft. Bowel sounds are normal. He exhibits no distension. There is no tenderness. There is no rebound.  Genitourinary:       No external hemorrhoids noted, he states they are internal.    Skin: Skin is warm and dry. No rash noted.          Assessment & Plan:

## 2011-08-30 ENCOUNTER — Other Ambulatory Visit: Payer: Self-pay | Admitting: *Deleted

## 2011-08-30 ENCOUNTER — Telehealth: Payer: Self-pay | Admitting: *Deleted

## 2011-08-30 DIAGNOSIS — B20 Human immunodeficiency virus [HIV] disease: Secondary | ICD-10-CM

## 2011-08-30 MED ORDER — EFAVIRENZ-EMTRICITAB-TENOFOVIR 600-200-300 MG PO TABS: 1.0000 | ORAL_TABLET | Freq: Every day | ORAL | Status: DC

## 2011-08-30 NOTE — Telephone Encounter (Signed)
Referral sent to [facility] for general surgeon.  Patient aware there may be a wait, and will notify him when appt is scheduled.  He was offered [facility] outpatient surgery clinic, but he preferred to be seen locally. Alan Boyle CMA

## 2011-08-31 ENCOUNTER — Telehealth: Payer: Self-pay | Admitting: *Deleted

## 2011-08-31 NOTE — Telephone Encounter (Signed)
Called patient and notified him of appt with [facility] for 10/02/11 at 2:30 pm. [name] CMA

## 2011-09-11 ENCOUNTER — Encounter (HOSPITAL_COMMUNITY): Payer: Self-pay | Admitting: Emergency Medicine

## 2011-09-11 ENCOUNTER — Emergency Department (HOSPITAL_COMMUNITY)
Admission: EM | Admit: 2011-09-11 | Discharge: 2011-09-11 | Disposition: A | Discharge status: Home / Self Care | Payer: Self-pay | Attending: Emergency Medicine | Admitting: Emergency Medicine

## 2011-09-11 DIAGNOSIS — Z21 Asymptomatic human immunodeficiency virus [HIV] infection status: Secondary | ICD-10-CM | POA: Insufficient documentation

## 2011-09-11 DIAGNOSIS — L0233 Carbuncle of buttock: Secondary | ICD-10-CM | POA: Insufficient documentation

## 2011-09-11 DIAGNOSIS — B20 Human immunodeficiency virus [HIV] disease: Secondary | ICD-10-CM | POA: Insufficient documentation

## 2011-09-11 HISTORY — DX: Human immunodeficiency virus (HIV) disease: B20

## 2011-09-11 MED ORDER — SULFAMETHOXAZOLE-TRIMETHOPRIM 800-160 MG PO TABS: 1.0000 | ORAL_TABLET | Freq: Two times a day (BID) | ORAL | Status: AC

## 2011-09-11 MED ORDER — CEPHALEXIN 500 MG PO CAPS: 500.0000 mg | ORAL_CAPSULE | Freq: Four times a day (QID) | ORAL | Status: AC

## 2011-09-11 NOTE — ED Notes (Signed)
Pt c/o abscess to left buttocks x 2 days; pt denies drainage

## 2011-09-11 NOTE — ED Notes (Signed)
Patient with abscess midline buttocks, patient states no drainage at this time.

## 2011-09-11 NOTE — ED Provider Notes (Signed)
History    This chart was scribed for Flint Melter, MD, MD by Smitty Pluck. The patient was seen in room TR04C and the patient's care was started at 12:31PM.   CSN: [id number]  Arrival date & time 09/11/11  1023   None     Chief Complaint  Patient presents with  . Abscess    (Consider location/radiation/quality/duration/timing/severity/associated sxs/prior treatment) The history is provided by the patient.   Alan Boyle is a 31 y.o. male who presents to the Emergency Department complaining of moderate abscess on left buttocks onset 1 day ago. Pt reports that it is painful and getting enlarged. Pt denies fevers, vomiting and gait problems. Symptoms have been constant since onset without radiation. Sitting aggravates the pain.   Past Medical History  Diagnosis Date  . Hypertension   . Diabetes mellitus   . HIV disease     History reviewed. No pertinent past surgical history.  History reviewed. No pertinent family history.  History  Substance Use Topics  . Smoking status: Current Everyday Smoker    Types: Cigars  . Smokeless tobacco: Never Used  . Alcohol Use: No      Review of Systems  Constitutional: Negative for fever and chills.  HENT: Negative for congestion and rhinorrhea.   Respiratory: Negative for cough and shortness of breath.   Gastrointestinal: Negative for nausea and vomiting.  Musculoskeletal: Negative for gait problem.  All other systems reviewed and are negative.    Allergies  Review of patient's allergies indicates no known allergies.  Home Medications   Current Outpatient Rx  Name Route Sig Dispense Refill  . EFAVIRENZ-EMTRICITAB-TENOFOVIR 600-200-300 MG PO TABS Oral Take 1 tablet by mouth at bedtime. 30 tablet 11  . CEPHALEXIN 500 MG PO CAPS Oral Take 1 capsule (500 mg total) by mouth 4 (four) times daily. 40 capsule 0  . SULFAMETHOXAZOLE-TRIMETHOPRIM 800-160 MG PO TABS Oral Take 1 tablet by mouth 2 (two) times daily. 28 tablet 0     BP 158/92  Pulse 86  Temp 98.6 F (37 C) (Oral)  Resp 18  SpO2 99%  Physical Exam  Nursing note and vitals reviewed. Constitutional: He is oriented to person, place, and time. He appears well-developed and well-nourished. No distress.  HENT:  Head: Normocephalic and atraumatic.  Eyes: Conjunctivae are normal. Pupils are equal, round, and reactive to light.  Neck: Normal range of motion. Neck supple.  Cardiovascular: Normal rate, regular rhythm and normal heart sounds.   Pulmonary/Chest: Effort normal. No respiratory distress.  Genitourinary:       Left buttocks abscess 4 x 3.5 cm mass that is mildly tender and not fluctuant Firm in consistency  No perianal tenderness   Neurological: He is alert and oriented to person, place, and time.  Skin: Skin is warm and dry.  Psychiatric: He has a normal mood and affect. His behavior is normal.    ED Course  Procedures (including critical care time) DIAGNOSTIC STUDIES: Oxygen Saturation is 99% on room air, normal by my interpretation.    COORDINATION OF CARE: 12:34PM EDP discusses pt ED treatment with pt.    Labs Reviewed - No data to display No results found.   1. Carbuncle of buttock       MDM  Carbuncle not amenable to I&D , i.e., not fluctuant. Doubt metabolic instability, serious bacterial infection or impending vascular collapse; the patient is stable for discharge.  Plan: Home Medications- Keflex and Septra; Home Treatments- Heat treatment; Recommended follow up- PCP  prn  I personally performed the services described in this documentation, which was scribed in my presence. The recorded information has been reviewed and considered.        Flint Melter, MD 09/12/11 1102

## 2011-09-11 NOTE — Discharge Instructions (Signed)
The area on your left buttocks, that is tender, is an early infection. It is not technically an abscess yet, but if it gets worse it can become an abscess. To help you improve, soak in a warm tub, 3-4 times a day for 30 minutes. Start taking the antibiotics as soon as possible. Call the general surgeon that is listed; if you're not better in 3-5 days. Take Tylenol or Advil for pain. Return here if needed for problems.

## 2011-09-22 ENCOUNTER — Other Ambulatory Visit: Payer: Self-pay

## 2011-09-25 ENCOUNTER — Other Ambulatory Visit (INDEPENDENT_AMBULATORY_CARE_PROVIDER_SITE_OTHER): Payer: Self-pay

## 2011-09-25 DIAGNOSIS — B20 Human immunodeficiency virus [HIV] disease: Secondary | ICD-10-CM

## 2011-09-25 LAB — CBC WITH DIFFERENTIAL/PLATELET
Basophils Absolute: 0 10*3/uL (ref 0.0–0.1)
Basophils Relative: 0 % (ref 0–1)
Eosinophils Absolute: 0.1 10*3/uL (ref 0.0–0.7)
Eosinophils Relative: 2 % (ref 0–5)
HCT: 39.9 % (ref 39.0–52.0)
MCHC: 32.8 g/dL (ref 30.0–36.0)
MCV: 87.1 fL (ref 78.0–100.0)
Monocytes Absolute: 0.5 10*3/uL (ref 0.1–1.0)
RDW: 14.7 % (ref 11.5–15.5)

## 2011-09-25 LAB — COMPLETE METABOLIC PANEL WITH GFR
AST: 14 U/L (ref 0–37)
Alkaline Phosphatase: 41 U/L (ref 39–117)
BUN: 9 mg/dL (ref 6–23)
Creat: 0.74 mg/dL (ref 0.50–1.35)

## 2011-09-26 LAB — T-HELPER CELL (CD4) - (RCID CLINIC ONLY)
CD4 % Helper T Cell: 27 % — ABNORMAL LOW (ref 33–55)
CD4 T Cell Abs: 640 uL (ref 400–2700)

## 2011-09-27 LAB — HIV-1 RNA QUANT-NO REFLEX-BLD: HIV-1 RNA Quant, Log: 2.11 {Log} — ABNORMAL HIGH (ref <1.30)

## 2011-10-02 ENCOUNTER — Ambulatory Visit (INDEPENDENT_AMBULATORY_CARE_PROVIDER_SITE_OTHER): Payer: Self-pay | Admitting: General Surgery

## 2011-10-04 ENCOUNTER — Encounter (HOSPITAL_COMMUNITY): Payer: Self-pay | Admitting: *Deleted

## 2011-10-04 ENCOUNTER — Emergency Department (HOSPITAL_COMMUNITY)
Admission: EM | Admit: 2011-10-04 | Discharge: 2011-10-04 | Disposition: A | Discharge status: Home / Self Care | Payer: Self-pay | Attending: Emergency Medicine | Admitting: Emergency Medicine

## 2011-10-04 DIAGNOSIS — F17200 Nicotine dependence, unspecified, uncomplicated: Secondary | ICD-10-CM | POA: Insufficient documentation

## 2011-10-04 DIAGNOSIS — L02419 Cutaneous abscess of limb, unspecified: Secondary | ICD-10-CM | POA: Insufficient documentation

## 2011-10-04 DIAGNOSIS — Z21 Asymptomatic human immunodeficiency virus [HIV] infection status: Secondary | ICD-10-CM | POA: Insufficient documentation

## 2011-10-04 DIAGNOSIS — L0291 Cutaneous abscess, unspecified: Secondary | ICD-10-CM

## 2011-10-04 DIAGNOSIS — I10 Essential (primary) hypertension: Secondary | ICD-10-CM | POA: Insufficient documentation

## 2011-10-04 DIAGNOSIS — B20 Human immunodeficiency virus [HIV] disease: Secondary | ICD-10-CM

## 2011-10-04 DIAGNOSIS — L03119 Cellulitis of unspecified part of limb: Secondary | ICD-10-CM | POA: Insufficient documentation

## 2011-10-04 DIAGNOSIS — E119 Type 2 diabetes mellitus without complications: Secondary | ICD-10-CM | POA: Insufficient documentation

## 2011-10-04 NOTE — ED Notes (Signed)
Abcess noted to right thigh. Pt states "I've been messing with is and some drainage comes out." skin rurrounding area is red.

## 2011-10-04 NOTE — ED Notes (Signed)
Pt d/c home in NAD. Pt voiced understanding of d/c instructions and follow up care.  

## 2011-10-04 NOTE — ED Notes (Signed)
To ED for eval of right thigh possible abscess. States area is very sore, was draining, becomes 'soft' after hot shower. Area appears to be size of small plum

## 2011-10-04 NOTE — ED Provider Notes (Signed)
History   This chart was scribed for Cheri Guppy, MD by Charolett Bumpers . The patient was seen in room TR09C/TR09C. Patient's care was started at 1124.    CSN: [id number]  Arrival date & time 10/04/11  1047   First MD Initiated Contact with Patient 10/04/11 1124      Chief Complaint  Patient presents with  . Abscess    (Consider location/radiation/quality/duration/timing/severity/associated sxs/prior treatment) HPI Alan Boyle is a 31 y.o. male who presents to the Emergency Department complaining of constant, moderate, worsening abscess located on his right thigh. Pt reports associated pain and drainage. Pt denies any erythema. Pt reports he has a h/o abscesses. Pt reports that his is also HIV positive in which he takes Atripla for and his last CD4 count was 700. Pt denies any other complaints of pain at this time.     Past Medical History  Diagnosis Date  . Hypertension   . Diabetes mellitus   . HIV disease     History reviewed. No pertinent past surgical history.  History reviewed. No pertinent family history.  History  Substance Use Topics  . Smoking status: Current Everyday Smoker    Types: Cigars  . Smokeless tobacco: Never Used  . Alcohol Use: No      Review of Systems  Constitutional: Negative for fever and chills.  Respiratory: Negative for shortness of breath.   Gastrointestinal: Negative for nausea and vomiting.  Skin:       Abscess.   Neurological: Negative for weakness.    Allergies  Review of patient's allergies indicates no known allergies.  Home Medications   Current Outpatient Rx  Name Route Sig Dispense Refill  . CEPHALEXIN 500 MG PO CAPS Oral Take 500 mg by mouth 4 (four) times daily.    . EFAVIRENZ-EMTRICITAB-TENOFOVIR 600-200-300 MG PO TABS Oral Take 1 tablet by mouth daily.      BP 152/99  Pulse 80  Temp 98.2 F (36.8 C) (Oral)  Resp 18  SpO2 100%  Physical Exam  Nursing note and vitals  reviewed. Constitutional: He is oriented to person, place, and time. He appears well-developed and well-nourished. No distress.  HENT:  Head: Normocephalic and atraumatic.  Eyes: EOM are normal.  Neck: Neck supple. No tracheal deviation present.  Cardiovascular: Normal rate.   Pulmonary/Chest: Effort normal. No respiratory distress.  Musculoskeletal: Normal range of motion.  Neurological: He is alert and oriented to person, place, and time.  Skin: Skin is warm and dry. No erythema.       On right anterior thigh, a 3 cm fluctuant abscess with no overlaying erythema. Central punctual noted. Tenderness to palpation noted.    Psychiatric: He has a normal mood and affect. His behavior is normal.    ED Course  Procedures (including critical care time)  INCISION AND DRAINAGE  Performed by: Cheri Guppy, MD Authorized by: Cheri Guppy, MD  Consent - Verbal Consent obtained Risks and benefits: risks/benefits and alternatives were discussed  Type: Abscess  Body Area: Right anterior thigh  Anesthesia: Local infiltration Local anesthetic: lidocaine 1%-with epinephrine  Anesthetic total: 2 ml  Complexity: Complex  Blunt dissection to break up loculations  Drainage: Purulent  Drainage amount: moderate  Dressing applied  Patient tolerance: Patient tolerated the procedure well with no immediate complications   DIAGNOSTIC STUDIES: Oxygen Saturation is 100% on room air, normal by my interpretation.    COORDINATION OF CARE:   11:30-Discussed planned course of treatment with the patient including I &  D of abscess, who is agreeable at this time.   12:05-Preformed I&D without any immediate complications, discussed at home care and strict return precautions.    Labs Reviewed - No data to display No results found.   No diagnosis found.    MDM  abscess   I personally performed the services described in this documentation, which was scribed in my presence. The  recorded information has been reviewed and considered.       Cheri Guppy, MD 10/04/11 1215

## 2011-10-06 ENCOUNTER — Encounter: Payer: Self-pay | Admitting: Internal Medicine

## 2011-10-06 ENCOUNTER — Ambulatory Visit (INDEPENDENT_AMBULATORY_CARE_PROVIDER_SITE_OTHER): Payer: Self-pay | Admitting: Internal Medicine

## 2011-10-06 ENCOUNTER — Ambulatory Visit: Payer: Self-pay

## 2011-10-06 VITALS — BP 169/102 | HR 73 | Temp 97.5°F | Wt 240.0 lb

## 2011-10-06 DIAGNOSIS — B20 Human immunodeficiency virus [HIV] disease: Secondary | ICD-10-CM

## 2011-10-06 NOTE — Assessment & Plan Note (Signed)
Doing well and just minimally detectable VL.  Feels well and pleased with medicine.  Will continue.  Offered and given condoms.  RTC 3 months.  Will be referred to a PCP for his DM and HTN.  Discussed diet modifications.

## 2011-10-06 NOTE — Progress Notes (Signed)
  Subjective:    Patient ID: Alan Boyle, male    DOB: May 30, 1980, 31 y.o.   MRN: [id number]  HPI Here for follow up of HIV.  Had been out of care but seen in June of this year and interested in starting therapy.  He was ARV naive.  He started Atripla and reports 100% compliance.  No side effects, no rashes or other issues.  Has had recent abscesses, most recent drained with I and D.  No fever or chills.     Review of Systems  Constitutional: Negative for fever, chills, fatigue and unexpected weight change.  HENT: Negative for sore throat and trouble swallowing.   Gastrointestinal: Negative for nausea, abdominal pain, diarrhea and constipation.  Skin: Negative for rash.  Neurological: Negative for dizziness, weakness and headaches.  Psychiatric/Behavioral: Negative for disturbed wake/sleep cycle and dysphoric mood. The patient is not nervous/anxious.        Objective:   Physical Exam  Constitutional: He appears well-developed and well-nourished. No distress.  Cardiovascular: Normal rate, regular rhythm and normal heart sounds.  Exam reveals no gallop and no friction rub.   No murmur heard. Pulmonary/Chest: Effort normal and breath sounds normal. No respiratory distress. He has no wheezes. He has no rales.  Abdominal: Soft. Bowel sounds are normal. He exhibits no distension. There is no tenderness.          Assessment & Plan:

## 2011-11-02 ENCOUNTER — Encounter: Payer: Medicaid Other | Admitting: Internal Medicine

## 2011-11-15 ENCOUNTER — Encounter (HOSPITAL_COMMUNITY): Payer: Self-pay | Admitting: *Deleted

## 2011-11-15 ENCOUNTER — Emergency Department (HOSPITAL_COMMUNITY)
Admission: EM | Admit: 2011-11-15 | Discharge: 2011-11-15 | Disposition: A | Discharge status: Home / Self Care | Payer: Medicaid Other | Attending: Emergency Medicine | Admitting: Emergency Medicine

## 2011-11-15 DIAGNOSIS — Z21 Asymptomatic human immunodeficiency virus [HIV] infection status: Secondary | ICD-10-CM | POA: Insufficient documentation

## 2011-11-15 DIAGNOSIS — IMO0002 Reserved for concepts with insufficient information to code with codable children: Secondary | ICD-10-CM | POA: Insufficient documentation

## 2011-11-15 DIAGNOSIS — L0291 Cutaneous abscess, unspecified: Secondary | ICD-10-CM

## 2011-11-15 DIAGNOSIS — E119 Type 2 diabetes mellitus without complications: Secondary | ICD-10-CM | POA: Insufficient documentation

## 2011-11-15 DIAGNOSIS — F17200 Nicotine dependence, unspecified, uncomplicated: Secondary | ICD-10-CM | POA: Insufficient documentation

## 2011-11-15 DIAGNOSIS — I10 Essential (primary) hypertension: Secondary | ICD-10-CM | POA: Insufficient documentation

## 2011-11-15 NOTE — ED Notes (Signed)
Pt with abscess under left axillary, noticed 2 days ago. No fevers.

## 2011-11-15 NOTE — ED Provider Notes (Signed)
History   This chart was scribed for Celene Kras, MD by Charolett Bumpers . The patient was seen in room TR10C/TR10C. Patient's care was started at 1539.    CSN: [id number]  Arrival date & time 11/15/11  1449   First MD Initiated Contact with Patient 11/15/11 1539      Chief Complaint  Patient presents with  . Abscess    HPI Alan Boyle is a 31 y.o. male who presents to the Emergency Department complaining of constant, moderate, worsening abscess under his right axilla for the past 2 days. Pt denies any drainage. Pt reports that he has also had an associated cough and sore throat. Pt denies any fevers. Pt reports that he a h/o abscess. Pt reports that he has a h/o HIV and takes Atripla regularly. Pt also has a h/o HTN and diabetes.   Past Medical History  Diagnosis Date  . Hypertension   . Diabetes mellitus   . HIV disease     History reviewed. No pertinent past surgical history.  History reviewed. No pertinent family history.  History  Substance Use Topics  . Smoking status: Current Everyday Smoker    Types: Cigars  . Smokeless tobacco: Never Used  . Alcohol Use: No      Review of Systems  Constitutional: Negative for fever and chills.  HENT: Positive for sore throat.   Respiratory: Positive for cough. Negative for shortness of breath.   Gastrointestinal: Negative for nausea and vomiting.  Skin: Positive for wound.  Neurological: Negative for weakness.  All other systems reviewed and are negative.    Allergies  Review of patient's allergies indicates no known allergies.  Home Medications   Current Outpatient Rx  Name Route Sig Dispense Refill  . EFAVIRENZ-EMTRICITAB-TENOFOVIR 600-200-300 MG PO TABS Oral Take 1 tablet by mouth daily.      BP 163/90  Pulse 78  Temp 98.9 F (37.2 C) (Oral)  Resp 16  SpO2 100%  Physical Exam  Nursing note and vitals reviewed. Constitutional: He appears well-developed and well-nourished. No distress.  HENT:    Head: Normocephalic and atraumatic.  Right Ear: External ear normal.  Left Ear: External ear normal.  Mouth/Throat: Oropharynx is clear and moist. No oropharyngeal exudate.       No oropharyngeal erythema or exudates.   Eyes: Conjunctivae are normal. Right eye exhibits no discharge. Left eye exhibits no discharge. No scleral icterus.  Neck: Neck supple. No tracheal deviation present.  Cardiovascular: Normal rate, regular rhythm and normal heart sounds.   Pulmonary/Chest: Effort normal and breath sounds normal. No stridor. No respiratory distress. He has no wheezes.  Musculoskeletal: He exhibits no edema.  Neurological: He is alert. Cranial nerve deficit: no gross deficits.  Skin: Skin is warm and dry. No rash noted.       Right axilla, 2-3 cm area of induration and erythema with central fluctuance that is consistent with abscess.   Psychiatric: He has a normal mood and affect.    ED Course  Procedures (including critical care time)  INCISION AND DRAINAGE  Performed by: Celene Kras, MD Authorized by: Celene Kras, MD  Consent - Verbal Consent obtained Risks and benefits: risks/benefits and alternatives were discussed  Type: Abscess  Body Area: Left axilla  Anesthesia: Local infiltration Local anesthetic: lidocaine 2% with epinephrine  Anesthetic total: 5 cc  Complexity: Complex  Blunt dissection to break up loculations  Drainage: Purulent  Drainage amount: small amount  Packing material: 1/4 iodoform  gauze  Patient tolerance: Patient tolerated the procedure well with no immediate complications   DIAGNOSTIC STUDIES: Oxygen Saturation is 100% on room air, normal by my interpretation.    COORDINATION OF CARE:  15:57-Discussed planned course of treatment with the patient including I&D of the abscess, who is agreeable at this time.   16:00-Preformed I&D with no immediate complications.   Labs Reviewed - No data to display No results found.   1. Abscess        MDM  Pt tolerated procedure well.  Reviewed old records.  Pt has CD4 count greater than 500 and minimally detected viral load.  No need for abx at this time.  Non toxic and well appearing.    I personally performed the services described in this documentation, which was scribed in my presence.  The recorded information has been reviewed and considered.      Celene Kras, MD 11/15/11 [phone]

## 2011-12-04 ENCOUNTER — Encounter (HOSPITAL_COMMUNITY): Payer: Self-pay | Admitting: *Deleted

## 2011-12-04 ENCOUNTER — Emergency Department (HOSPITAL_COMMUNITY)
Admission: EM | Admit: 2011-12-04 | Discharge: 2011-12-04 | Disposition: A | Discharge status: Home / Self Care | Payer: No Typology Code available for payment source | Attending: Emergency Medicine | Admitting: Emergency Medicine

## 2011-12-04 DIAGNOSIS — E119 Type 2 diabetes mellitus without complications: Secondary | ICD-10-CM | POA: Insufficient documentation

## 2011-12-04 DIAGNOSIS — IMO0002 Reserved for concepts with insufficient information to code with codable children: Secondary | ICD-10-CM | POA: Insufficient documentation

## 2011-12-04 DIAGNOSIS — Z21 Asymptomatic human immunodeficiency virus [HIV] infection status: Secondary | ICD-10-CM | POA: Insufficient documentation

## 2011-12-04 DIAGNOSIS — I10 Essential (primary) hypertension: Secondary | ICD-10-CM | POA: Insufficient documentation

## 2011-12-04 DIAGNOSIS — L02411 Cutaneous abscess of right axilla: Secondary | ICD-10-CM

## 2011-12-04 DIAGNOSIS — F17200 Nicotine dependence, unspecified, uncomplicated: Secondary | ICD-10-CM | POA: Insufficient documentation

## 2011-12-04 MED ORDER — OXYCODONE-ACETAMINOPHEN 5-325 MG PO TABS: 1.0000 | ORAL_TABLET | Freq: Four times a day (QID) | ORAL | Status: DC | PRN

## 2011-12-04 MED ORDER — OXYCODONE-ACETAMINOPHEN 5-325 MG PO TABS
2.0000 | ORAL_TABLET | Freq: Once | ORAL | Status: AC
  Administered 2011-12-04: 2 via ORAL
  Filled 2011-12-04: qty 2

## 2011-12-04 MED ORDER — SULFAMETHOXAZOLE-TRIMETHOPRIM 800-160 MG PO TABS: 1.0000 | ORAL_TABLET | Freq: Two times a day (BID) | ORAL | Status: AC

## 2011-12-04 MED ORDER — SULFAMETHOXAZOLE-TMP DS 800-160 MG PO TABS
1.0000 | ORAL_TABLET | Freq: Once | ORAL | Status: AC
  Administered 2011-12-04: 1 via ORAL
  Filled 2011-12-04: qty 1

## 2011-12-04 NOTE — ED Provider Notes (Signed)
Pt seen by Dr Bernette Mayers.  I was asked to perform I&D  INCISION AND DRAINAGE Performed by: Trixie Dredge B Consent: Verbal consent obtained. Risks and benefits: risks, benefits and alternatives were discussed Type: abscess  Body area: right axilla  Anesthesia: local infiltration  Local anesthetic: lidocaine 2% with epinephrine  Anesthetic total: 7 ml  Complexity: complex Blunt dissection to break up loculations  Drainage: purulent  Drainage amount: large  Packing material: 1/4 in iodoform gauze  Patient tolerance: Patient tolerated the procedure well with no immediate complications.  Culture obtained.  Further care, abx treatment, and follow up to be determined by Dr Bernette Mayers.   I have asked Buddy Duty, [employer], who has confirmed and notified patient that he does have medicaid, and has been informed how to find a primary care provider and a new medicaid card.       [place], [place] 12/04/11 1218

## 2011-12-04 NOTE — ED Provider Notes (Signed)
Medical screening examination/treatment/procedure(s) were conducted as a shared visit with non-physician practitioner(s) and myself.  I personally evaluated the patient during the encounter   Alan B. Sheldon, MD 12/04/11 1644 

## 2011-12-04 NOTE — ED Notes (Signed)
PA at bedside for I & D.

## 2011-12-04 NOTE — ED Provider Notes (Signed)
History  This chart was scribed for Alan B. Bernette Mayers, MD by Shari Heritage. The patient was seen in room TR09C/TR09C. Patient's care was started at 1003.     CSN: [id number]  Arrival date & time 12/04/11  0272   First MD Initiated Contact with Patient 12/04/11 1003      Chief Complaint  Patient presents with  . Abscess    The history is provided by the patient. No language interpreter was used.    JAISE MOSER is a 31 y.o. male who presents to the Emergency Department complaining of an repeated, moderate, worsening abscess to the right axilla. Patient has an associated mild to moderate HA. Patient states that he squeezed the abscess with his fingers a few days ago and it began to drain, but this did not completely alleviate symptoms. He has had abscesses before in the same place and on his right leg.  Patient was seen for the same on 11/15/11 by Dr. Linwood Dibbles. An I&D procedure was done and purulent drainage was relieved from the area. Patient was discharged with no antibiotics prescriptions in stable condition.  Patient took the bus to the ED.   Past Medical History  Diagnosis Date  . Hypertension   . Diabetes mellitus   . HIV disease     History reviewed. No pertinent past surgical history.  History reviewed. No pertinent family history.  History  Substance Use Topics  . Smoking status: Current Every Day Smoker    Types: Cigars  . Smokeless tobacco: Never Used  . Alcohol Use: No      Review of Systems A complete 10 system review of systems was obtained and all systems are negative except as noted in the HPI and PMH.   Allergies  Review of patient's allergies indicates no known allergies.  Home Medications   Current Outpatient Rx  Name Route Sig Dispense Refill  . EFAVIRENZ-EMTRICITAB-TENOFOVIR 600-200-300 MG PO TABS Oral Take 1 tablet by mouth daily.      BP 143/99  Pulse 93  Temp 98.6 F (37 C) (Oral)  Resp 16  SpO2 99%  Physical Exam  Nursing  note and vitals reviewed. Constitutional: He is oriented to person, place, and time. He appears well-developed and well-nourished.  HENT:  Head: Normocephalic and atraumatic.  Eyes: EOM are normal. Pupils are equal, round, and reactive to light.  Neck: Normal range of motion. Neck supple.  Cardiovascular: Normal rate, normal heart sounds and intact distal pulses.   Pulmonary/Chest: Effort normal and breath sounds normal.  Abdominal: Bowel sounds are normal. He exhibits no distension. There is no tenderness.  Musculoskeletal: Normal range of motion. He exhibits no edema and no tenderness.  Neurological: He is alert and oriented to person, place, and time. He has normal strength. No cranial nerve deficit or sensory deficit.  Skin: Skin is warm and dry. No rash noted.       Large, tender, fluctuant abscess at the right axilla.  Psychiatric: He has a normal mood and affect.    ED Course  Procedures (including critical care time) DIAGNOSTIC STUDIES: Oxygen Saturation is 99% on room air, normal by my interpretation.    COORDINATION OF CARE: 10:18am- Patient informed of current plan for treatment and evaluation and agrees with plan at this time.     Labs Reviewed - No data to display  No results found.   No diagnosis found.    MDM  Pt given pain medications and Abx. Abscess drainage to be done  by Trixie Dredge, PA. Advised close ID followup. His latest CD4 count has been adequate and viral load very low.       I personally performed the services described in the documentation, which were scribed in my presence. The recorded information has been reviewed and considered.     Alan B. Bernette Mayers, MD 12/04/11 1150

## 2011-12-04 NOTE — ED Notes (Signed)
To ED for eval of right axilla abscess. States he was in ED 3-4 wks ago for same.

## 2011-12-06 LAB — WOUND CULTURE

## 2012-05-01 ENCOUNTER — Ambulatory Visit: Payer: Self-pay

## 2012-05-14 ENCOUNTER — Other Ambulatory Visit: Payer: Self-pay | Admitting: Licensed Clinical Social Worker

## 2012-05-14 DIAGNOSIS — B20 Human immunodeficiency virus [HIV] disease: Secondary | ICD-10-CM

## 2012-05-14 MED ORDER — EFAVIRENZ-EMTRICITAB-TENOFOVIR 600-200-300 MG PO TABS: 1.0000 | ORAL_TABLET | Freq: Every day | ORAL | Status: DC

## 2012-07-18 ENCOUNTER — Ambulatory Visit (INDEPENDENT_AMBULATORY_CARE_PROVIDER_SITE_OTHER): Payer: Self-pay | Admitting: Internal Medicine

## 2012-07-18 ENCOUNTER — Encounter: Payer: Self-pay | Admitting: Internal Medicine

## 2012-07-18 VITALS — BP 141/83 | HR 98 | Temp 97.4°F | Wt 247.0 lb

## 2012-07-18 DIAGNOSIS — K1370 Unspecified lesions of oral mucosa: Secondary | ICD-10-CM

## 2012-07-18 DIAGNOSIS — Z79899 Other long term (current) drug therapy: Secondary | ICD-10-CM

## 2012-07-18 DIAGNOSIS — B20 Human immunodeficiency virus [HIV] disease: Secondary | ICD-10-CM

## 2012-07-18 DIAGNOSIS — Z113 Encounter for screening for infections with a predominantly sexual mode of transmission: Secondary | ICD-10-CM

## 2012-07-18 LAB — LIPID PANEL
Cholesterol: 85 mg/dL (ref 0–200)
Triglycerides: 52 mg/dL (ref <150)

## 2012-07-18 LAB — RPR

## 2012-07-18 LAB — COMPREHENSIVE METABOLIC PANEL
BUN: 8 mg/dL (ref 6–23)
CO2: 29 mEq/L (ref 19–32)
Calcium: 8.9 mg/dL (ref 8.4–10.5)
Chloride: 106 mEq/L (ref 96–112)
Creat: 0.8 mg/dL (ref 0.50–1.35)
Total Bilirubin: 0.2 mg/dL — ABNORMAL LOW (ref 0.3–1.2)

## 2012-07-18 MED ORDER — VALACYCLOVIR HCL 1 G PO TABS: 1000.0000 mg | ORAL_TABLET | Freq: Two times a day (BID) | ORAL | Status: DC

## 2012-07-18 NOTE — Progress Notes (Signed)
[facility] NOTE  RFV: routine follow up, getting back into care Subjective:    Patient ID: Alan Boyle, male    DOB: 22-Oct-1980, 32 y.o.   MRN: [id number]  HPI 32yo Male with HIV, CD 4 count 640/ VL <200 in July 2013, previously on atripla, getting back into care after 10 months. Has had unprotected sex with old partners in the past 12 months and as recent as 2 nights ago. He has had ulcers on lip after having unprotected sex which is improved. No recnet fever, chills, nightsweats, cough, or headache.   Current Outpatient Prescriptions on File Prior to Visit  Medication Sig Dispense Refill  . efavirenz-emtricitabine-tenofovir (ATRIPLA) 600-200-300 MG per tablet Take 1 tablet by mouth daily.  30 tablet  6  . oxyCODONE-acetaminophen (PERCOCET/ROXICET) 5-325 MG per tablet Take 1-2 tablets by mouth every 6 (six) hours as needed for pain.  20 tablet  0   No current facility-administered medications on file prior to visit.   Active Ambulatory Problems    Diagnosis Date Noted  . HIV DISEASE 05/06/2009  . DIABETES MELLITUS, TYPE II 05/06/2009  . DEPRESSION 05/06/2009  . HYPERTENSION 05/06/2009  . SYPHILIS 03/29/2010  . Obese 08/15/2011  . HIV disease    Resolved Ambulatory Problems    Diagnosis Date Noted  . No Resolved Ambulatory Problems   Past Medical History  Diagnosis Date  . Hypertension   . Diabetes mellitus    Social hx: unemployed, lives in grandfather's old home. Smoke 2 cig/day; no drinking, no illicit drug  Family hx: htn, and dm   Review of Systems  Constitutional: chills x 3 days, diaphoresis, activity change, appetite change, fatigue and unexpected weight change.  HENT: Negative for congestion, sore throat, rhinorrhea, sneezing, trouble swallowing and sinus pressure.  Eyes: Negative for photophobia and visual disturbance.  Respiratory: + congested. Negative for cough, chest tightness, shortness of breath, wheezing and stridor.  Cardiovascular: Negative  for chest pain, palpitations and leg swelling.  Gastrointestinal: Negative for nausea, vomiting, abdominal pain, diarrhea, constipation, blood in stool, abdominal distention and anal bleeding.  Genitourinary: Negative for dysuria, hematuria, flank pain and difficulty urinating.  Musculoskeletal: Negative for myalgias, back pain, joint swelling, arthralgias and gait problem.  Skin: Negative for color change, pallor, rash and wound.  Neurological: Negative for dizziness, tremors, weakness and light-headedness.  Hematological: Negative for adenopathy. Does not bruise/bleed easily.  Psychiatric/Behavioral: Negative for behavioral problems, confusion, sleep disturbance, dysphoric mood, decreased concentration and agitation.       Objective:   Physical Exam BP 141/83  Pulse 98  Temp(Src) 97.4 F (36.3 C) (Oral)  Wt 247 lb (112.038 kg)  BMI 31.7 kg/m2 Physical Exam  Constitutional: He is oriented to person, place, and time. He appears well-developed and well-nourished. No distress.  HENT:  Mouth/Throat: Oropharynx is clear and moist. No oropharyngeal exudate.  Cardiovascular: Normal rate, regular rhythm and normal heart sounds. Exam reveals no gallop and no friction rub.  No murmur heard.  Pulmonary/Chest: Effort normal and breath sounds normal. No respiratory distress. He has no wheezes.  Abdominal: Soft. Bowel sounds are normal. He exhibits no distension. There is no tenderness.  Lymphadenopathy:  He has no cervical adenopathy.  Neurological: He is alert and oriented to person, place, and time.  Skin: Skin is warm and dry. No rash noted. No erythema.  Psychiatric: He has a normal mood and affect. His behavior is normal.       Assessment & Plan:   hiv =  currently on atripla, we will check to see that he is virologically controlled. Will check cd 4 count and viral load  Dm = no meds for management right now. 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

## 2012-07-19 LAB — CBC WITH DIFFERENTIAL/PLATELET
Eosinophils Absolute: 0.4 10*3/uL (ref 0.0–0.7)
Eosinophils Relative: 5 % (ref 0–5)
HCT: 29.8 % — ABNORMAL LOW (ref 39.0–52.0)
Lymphocytes Relative: 28 % (ref 12–46)
Lymphs Abs: 2 10*3/uL (ref 0.7–4.0)
MCH: 24.5 pg — ABNORMAL LOW (ref 26.0–34.0)
MCV: 79.5 fL (ref 78.0–100.0)
Monocytes Absolute: 1 10*3/uL (ref 0.1–1.0)
Platelets: 406 10*3/uL — ABNORMAL HIGH (ref 150–400)
RBC: 3.75 MIL/uL — ABNORMAL LOW (ref 4.22–5.81)
RDW: 15.1 % (ref 11.5–15.5)
WBC: 7.2 10*3/uL (ref 4.0–10.5)

## 2012-07-19 LAB — T-HELPER CELL (CD4) - (RCID CLINIC ONLY)
CD4 % Helper T Cell: 29 % — ABNORMAL LOW (ref 33–55)
CD4 T Cell Abs: 620 uL (ref 400–2700)

## 2012-07-19 LAB — URINALYSIS
Hgb urine dipstick: NEGATIVE
Leukocytes, UA: NEGATIVE
Nitrite: NEGATIVE
Specific Gravity, Urine: >1.03 — ABNORMAL HIGH (ref 1.005–1.030)
pH: 5.5 (ref 5.0–8.0)

## 2012-07-19 LAB — HEMOGLOBIN A1C: Mean Plasma Glucose: 117 mg/dL — ABNORMAL HIGH (ref <117)

## 2012-07-21 LAB — HIV-1 RNA QUANT-NO REFLEX-BLD
HIV 1 RNA Quant: <20 copies/mL (ref <20)
HIV-1 RNA Quant, Log: <1.3 {Log} (ref <1.30)

## 2012-07-22 LAB — HERPES SIMPLEX VIRUS CULTURE: Organism ID, Bacteria: NOT DETECTED

## 2012-10-08 ENCOUNTER — Ambulatory Visit: Payer: Self-pay

## 2012-10-08 ENCOUNTER — Other Ambulatory Visit: Payer: Self-pay

## 2012-10-08 ENCOUNTER — Other Ambulatory Visit: Payer: Self-pay | Admitting: Internal Medicine

## 2012-10-08 DIAGNOSIS — B20 Human immunodeficiency virus [HIV] disease: Secondary | ICD-10-CM

## 2012-10-22 ENCOUNTER — Ambulatory Visit: Payer: Self-pay

## 2012-10-22 ENCOUNTER — Ambulatory Visit (INDEPENDENT_AMBULATORY_CARE_PROVIDER_SITE_OTHER): Payer: Self-pay | Admitting: Internal Medicine

## 2012-10-22 ENCOUNTER — Encounter: Payer: Self-pay | Admitting: Internal Medicine

## 2012-10-22 VITALS — BP 150/94 | HR 96 | Temp 98.4°F | Ht 74.0 in | Wt 246.0 lb

## 2012-10-22 DIAGNOSIS — B20 Human immunodeficiency virus [HIV] disease: Secondary | ICD-10-CM

## 2012-10-22 DIAGNOSIS — E119 Type 2 diabetes mellitus without complications: Secondary | ICD-10-CM

## 2012-10-22 DIAGNOSIS — Z23 Encounter for immunization: Secondary | ICD-10-CM

## 2012-10-22 DIAGNOSIS — I10 Essential (primary) hypertension: Secondary | ICD-10-CM

## 2012-10-22 LAB — CBC WITH DIFFERENTIAL/PLATELET
Basophils Absolute: 0 10*3/uL (ref 0.0–0.1)
HCT: 30.5 % — ABNORMAL LOW (ref 39.0–52.0)
Lymphocytes Relative: 42 % (ref 12–46)
Neutro Abs: 2.3 10*3/uL (ref 1.7–7.7)
Platelets: 418 10*3/uL — ABNORMAL HIGH (ref 150–400)
RDW: 17 % — ABNORMAL HIGH (ref 11.5–15.5)
WBC: 4.7 10*3/uL (ref 4.0–10.5)

## 2012-10-22 LAB — COMPLETE METABOLIC PANEL WITH GFR
Albumin: 4.1 g/dL (ref 3.5–5.2)
Alkaline Phosphatase: 54 U/L (ref 39–117)
BUN: 9 mg/dL (ref 6–23)
Creat: 0.9 mg/dL (ref 0.50–1.35)
GFR, Est Non African American: >89 mL/min
Glucose, Bld: 102 mg/dL — ABNORMAL HIGH (ref 70–99)
Potassium: 4.3 mEq/L (ref 3.5–5.3)

## 2012-10-22 MED ORDER — EFAVIRENZ-EMTRICITAB-TENOFOVIR 600-200-300 MG PO TABS: 1.0000 | ORAL_TABLET | Freq: Every day | ORAL | Status: DC

## 2012-10-22 NOTE — Addendum Note (Signed)
Addended by: Gardiner Barefoot on: 10/22/2012 10:42 AM   Modules accepted: Orders

## 2012-10-22 NOTE — Assessment & Plan Note (Signed)
He has been off of medication and his last hemoglobin A1c was actually 5.7. He had a significant amount of weight loss.here his weight has been stable over the last year.  Encouraged continued exercise and weight loss with healthy eating including decreased carbohydrates

## 2012-10-22 NOTE — Progress Notes (Signed)
  Subjective:    Patient ID: Alan Boyle, male    DOB: 06-15-1980, 32 y.o.   MRN: [id number]  HPI He comes in for routine followup of his HIV. He continues on Atripla which she has been on for more than 1 year and reports excellent compliance. He has sporadic followup the was last seen in May of this year with a CD4 count of 620 and an undetectable viral load. He feels well and continues to take the medicine with out any problems. He knows to remove for his drug assistance program. He has had purposeful weight loss over the last year and he does smoke black and mild cigarettes. He has had no diarrhea or rash   Review of Systems  Constitutional: Negative for fever, activity change, appetite change, fatigue and unexpected weight change.  HENT: Negative for sore throat and trouble swallowing.   Eyes: Negative for visual disturbance.  Respiratory: Negative for shortness of breath.   Cardiovascular: Negative for leg swelling.  Gastrointestinal: Negative for nausea, abdominal pain and diarrhea.  Musculoskeletal: Negative for myalgias and arthralgias.  Skin: Negative for rash.  Allergic/Immunologic: Negative for immunocompromised state.  Neurological: Negative for dizziness and headaches.  Hematological: Negative for adenopathy.  Psychiatric/Behavioral: Negative for dysphoric mood.       Objective:   Physical Exam  Constitutional: He is oriented to person, place, and time. He appears well-developed and well-nourished. No distress.  HENT:  Mouth/Throat: No oropharyngeal exudate.  Eyes: Right eye exhibits no discharge. Left eye exhibits no discharge. No scleral icterus.  Cardiovascular: Normal rate, regular rhythm and normal heart sounds.   No murmur heard. Pulmonary/Chest: Effort normal and breath sounds normal. No respiratory distress. He has no wheezes.  Lymphadenopathy:    He has no cervical adenopathy.  Neurological: He is alert and oriented to person, place, and time.  Skin: Skin  is warm and dry. No rash noted.  Psychiatric: He has a normal mood and affect. His behavior is normal.          Assessment & Plan:

## 2012-10-22 NOTE — Addendum Note (Signed)
Addended by: [name] A on: 10/22/2012 11:56 AM   Modules accepted: Orders

## 2012-10-22 NOTE — Assessment & Plan Note (Signed)
His blood pressure is significantly elevated. No headaches or vision changes. I discussed the risks of smoking and high blood pressure as well as stroke and heart disease. He has opted to quit smoking and I will recheck his blood pressure next visit if he has quit smoking off of cigarettes.

## 2012-10-22 NOTE — Assessment & Plan Note (Signed)
He is doing well despite his sporadic followup. I will check his labs today to assure he continues to do well with viral suppression.  If okay, he will return in 4 months

## 2012-10-23 LAB — T-HELPER CELL (CD4) - (RCID CLINIC ONLY): CD4 % Helper T Cell: 29 % — ABNORMAL LOW (ref 33–55)

## 2012-11-11 ENCOUNTER — Encounter (HOSPITAL_COMMUNITY): Payer: Self-pay | Admitting: Emergency Medicine

## 2012-11-11 ENCOUNTER — Emergency Department (HOSPITAL_COMMUNITY)
Admission: EM | Admit: 2012-11-11 | Discharge: 2012-11-11 | Disposition: A | Discharge status: Home / Self Care | Payer: Medicaid Other | Attending: Emergency Medicine | Admitting: Emergency Medicine

## 2012-11-11 DIAGNOSIS — S0501XA Injury of conjunctiva and corneal abrasion without foreign body, right eye, initial encounter: Secondary | ICD-10-CM

## 2012-11-11 DIAGNOSIS — Y929 Unspecified place or not applicable: Secondary | ICD-10-CM | POA: Insufficient documentation

## 2012-11-11 DIAGNOSIS — Y9389 Activity, other specified: Secondary | ICD-10-CM | POA: Insufficient documentation

## 2012-11-11 DIAGNOSIS — E119 Type 2 diabetes mellitus without complications: Secondary | ICD-10-CM | POA: Insufficient documentation

## 2012-11-11 DIAGNOSIS — I10 Essential (primary) hypertension: Secondary | ICD-10-CM | POA: Insufficient documentation

## 2012-11-11 DIAGNOSIS — F17200 Nicotine dependence, unspecified, uncomplicated: Secondary | ICD-10-CM | POA: Insufficient documentation

## 2012-11-11 DIAGNOSIS — Z21 Asymptomatic human immunodeficiency virus [HIV] infection status: Secondary | ICD-10-CM | POA: Insufficient documentation

## 2012-11-11 DIAGNOSIS — H538 Other visual disturbances: Secondary | ICD-10-CM | POA: Insufficient documentation

## 2012-11-11 DIAGNOSIS — S058X9A Other injuries of unspecified eye and orbit, initial encounter: Secondary | ICD-10-CM | POA: Insufficient documentation

## 2012-11-11 DIAGNOSIS — T1590XA Foreign body on external eye, part unspecified, unspecified eye, initial encounter: Secondary | ICD-10-CM | POA: Insufficient documentation

## 2012-11-11 MED ORDER — TETRACAINE HCL 0.5 % OP SOLN
1.0000 [drp] | Freq: Once | OPHTHALMIC | Status: DC
  Filled 2012-11-11: qty 2

## 2012-11-11 MED ORDER — TRAMADOL HCL 50 MG PO TABS: 50.0000 mg | ORAL_TABLET | Freq: Four times a day (QID) | ORAL | Status: DC | PRN

## 2012-11-11 MED ORDER — ERYTHROMYCIN 5 MG/GM OP OINT
1.0000 "application " | TOPICAL_OINTMENT | OPHTHALMIC | Status: DC
  Administered 2012-11-11: 1 via OPHTHALMIC
  Filled 2012-11-11: qty 1

## 2012-11-11 MED ORDER — FLUORESCEIN SODIUM 1 MG OP STRP
1.0000 | ORAL_STRIP | Freq: Once | OPHTHALMIC | Status: DC
  Filled 2012-11-11: qty 1

## 2012-11-11 NOTE — ED Provider Notes (Signed)
CSN: [id number]     Arrival date & time 11/11/12  4098 History   First MD Initiated Contact with Patient 11/11/12 [phone]     Chief Complaint  Patient presents with  . Eye Injury   (Consider location/radiation/quality/duration/timing/severity/associated sxs/prior Treatment) HPI Patient presents one day after sustaining an injury to his right eye with ongoing pain in the eye. He states that he was scratching his right eye yesterday, since that time his pain has been persistent, is sharp, severe.  No relief with anything.  There is associated clear drainage.  There is occasional blurry vision, but no sustained visual loss or changes. Patient states that he has been in his usual state of health, and that his chronic disease is well managed. He denies other complaints. Past Medical History  Diagnosis Date  . Hypertension   . Diabetes mellitus   . HIV disease    History reviewed. No pertinent past surgical history. No family history on file. History  Substance Use Topics  . Smoking status: Current Every Day Smoker    Types: Cigars  . Smokeless tobacco: Never Used  . Alcohol Use: No    Review of Systems  All other systems reviewed and are negative.    Allergies  Review of patient's allergies indicates no known allergies.  Home Medications   Current Outpatient Rx  Name  Route  Sig  Dispense  Refill  . efavirenz-emtricitabine-tenofovir (ATRIPLA) 600-200-300 MG per tablet   Oral   Take 1 tablet by mouth daily.   30 tablet   6   . traMADol (ULTRAM) 50 MG tablet   Oral   Take 1 tablet (50 mg total) by mouth every 6 (six) hours as needed for pain.   15 tablet   0    BP 151/99  Pulse 84  Temp(Src) 99.1 F (37.3 C) (Oral)  Resp 16  SpO2 100% Physical Exam  Nursing note and vitals reviewed. Constitutional: He appears well-developed and well-nourished.  HENT:  Head: Normocephalic and atraumatic.  Eyes: Pupils are equal, round, and reactive to light. Lids are everted and  swept, no foreign bodies found. Right eye exhibits discharge. Right eye exhibits no chemosis, no exudate and no hordeolum. No foreign body present in the right eye. Left eye exhibits no chemosis, no discharge, no exudate and no hordeolum. No foreign body present in the left eye. Right conjunctiva is not injected. Right conjunctiva has no hemorrhage. Left conjunctiva is not injected. Left conjunctiva has no hemorrhage. No scleral icterus. Right eye exhibits normal extraocular motion and no nystagmus. Left eye exhibits normal extraocular motion and no nystagmus.    Skin: He is not diaphoretic.    ED Course  Procedures (including critical care time) Labs Review Labs Reviewed - No data to display Imaging Review No results found.  MDM   1. Corneal abrasion, right, initial encounter    This patient was essentially preserved his usual daily, no contact his presents with one day of eye pain and on exam is found to have a corneal abrasion.  Patient does have chronic disease, but states that this is well-controlled, and appears in no distress, with no fever or other overt signs of ongoing systemic illness.  Patient started on systemic analgesia with topical antibiotics for pain control.  Discharged to follow up with ophthalmology    Gerhard Munch, MD 11/11/12 [phone]

## 2012-11-11 NOTE — ED Notes (Addendum)
Visual Acuity   Left Eye: 20/50  Right Eye:20/100  Both Eyes: 20/40

## 2012-11-11 NOTE — ED Notes (Signed)
Per Patient: Pt reports accidentally scratching his R eye with his hand yesterday. Pt reports redness, swelling, and clear drainage. Pt reports blurred vision in affected eye. Denies any other complaints.

## 2012-12-08 ENCOUNTER — Emergency Department (HOSPITAL_COMMUNITY)
Admission: EM | Admit: 2012-12-08 | Discharge: 2012-12-08 | Disposition: A | Discharge status: Home / Self Care | Payer: Medicaid Other | Attending: Emergency Medicine | Admitting: Emergency Medicine

## 2012-12-08 ENCOUNTER — Encounter (HOSPITAL_COMMUNITY): Payer: Self-pay

## 2012-12-08 DIAGNOSIS — A630 Anogenital (venereal) warts: Secondary | ICD-10-CM | POA: Insufficient documentation

## 2012-12-08 DIAGNOSIS — F17200 Nicotine dependence, unspecified, uncomplicated: Secondary | ICD-10-CM | POA: Insufficient documentation

## 2012-12-08 DIAGNOSIS — Z21 Asymptomatic human immunodeficiency virus [HIV] infection status: Secondary | ICD-10-CM | POA: Insufficient documentation

## 2012-12-08 DIAGNOSIS — I10 Essential (primary) hypertension: Secondary | ICD-10-CM | POA: Insufficient documentation

## 2012-12-08 DIAGNOSIS — E119 Type 2 diabetes mellitus without complications: Secondary | ICD-10-CM | POA: Insufficient documentation

## 2012-12-08 MED ORDER — OXYCODONE-ACETAMINOPHEN 5-325 MG PO TABS: 2.0000 | ORAL_TABLET | ORAL | Status: DC | PRN

## 2012-12-08 MED ORDER — OXYCODONE-ACETAMINOPHEN 5-325 MG PO TABS
2.0000 | ORAL_TABLET | Freq: Once | ORAL | Status: AC
  Administered 2012-12-08: 2 via ORAL
  Filled 2012-12-08: qty 2

## 2012-12-08 NOTE — ED Provider Notes (Signed)
CSN: [id number]     Arrival date & time 12/08/12  0444 History   First MD Initiated Contact with Patient 12/08/12 [phone]     Chief Complaint  Patient presents with  . Hemorrhoids   (Consider location/radiation/quality/duration/timing/severity/associated sxs/prior Treatment) The history is provided by the patient.  Alan Boyle is a 32 y.o. male hx of HTN, HIV here with rectal pain. He has sex with men and received rectal intercourse. Noticed some hemorrhoids for a year. It has been more painful recently and more enlarged. Denies fever or chills or abdominal pain.     Past Medical History  Diagnosis Date  . Hypertension   . Diabetes mellitus   . HIV disease    History reviewed. No pertinent past surgical history. History reviewed. No pertinent family history. History  Substance Use Topics  . Smoking status: Current Every Day Smoker    Types: Cigars  . Smokeless tobacco: Never Used  . Alcohol Use: No    Review of Systems  Gastrointestinal: Positive for rectal pain.  All other systems reviewed and are negative.    Allergies  Review of patient's allergies indicates no known allergies.  Home Medications   Current Outpatient Rx  Name  Route  Sig  Dispense  Refill  . efavirenz-emtricitabine-tenofovir (ATRIPLA) 600-200-300 MG per tablet   Oral   Take 1 tablet by mouth daily.   30 tablet   6    There were no vitals taken for this visit. Physical Exam  Nursing note and vitals reviewed. Constitutional: He is oriented to person, place, and time. He appears well-developed.  Anxious   HENT:  Head: Normocephalic.  Mouth/Throat: Oropharynx is clear and moist.  Eyes: Conjunctivae are normal. Pupils are equal, round, and reactive to light.  Neck: Normal range of motion.  Cardiovascular: Normal rate, regular rhythm and normal heart sounds.   Pulmonary/Chest: Effort normal.  Abdominal: Soft. Bowel sounds are normal. He exhibits no distension. There is no tenderness. There is  no rebound.  Genitourinary:  Cauliflower like structure coming out of rectum. It is painful and tender. Small hemorrhoid at the base.   Musculoskeletal: Normal range of motion.  Neurological: He is alert and oriented to person, place, and time.  Skin: Skin is warm and dry.  Psychiatric: He has a normal mood and affect. His behavior is normal. Judgment and thought content normal.    ED Course  Procedures (including critical care time) Labs Review Labs Reviewed - No data to display Imaging Review No results found.  MDM  No diagnosis found. Alan Boyle is a 32 y.o. male here with rectal pain. I tried to numb it up with lidocaine but patient unable to tolerate it. I called surgery, Dr. 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 Recommend pain meds and f/u with colorectal surgeon.      Richardean Canal, MD 12/08/12 [phone]

## 2012-12-08 NOTE — ED Notes (Signed)
Pt brought in by [facility], patient has had hemorrhoids for over a year.  Pt has been using lube to push them back in to his rectum, bp 180/100 in route

## 2012-12-12 ENCOUNTER — Telehealth: Payer: Self-pay | Admitting: *Deleted

## 2012-12-12 MED ORDER — OXYCODONE-ACETAMINOPHEN 5-325 MG PO TABS: 2.0000 | ORAL_TABLET | ORAL | Status: DC | PRN

## 2012-12-12 NOTE — Telephone Encounter (Signed)
Requesting enough pain medication to get him to his surgery appt 12/13/12.  Seeing Dr. Johna Sheriff.  Received 10 percocet tablets 12/08/12.  Pt stated he was needing to take pain rx every 3 hours due to intense anal pain.  Please advise.  Pt has three percocet tablets.  Appointment tomorrow at 1:30.  RN spoke with Dr. Luciana Axe and obtained refill for percocet.  Pt coming later this afternoon to pick up prescription.

## 2012-12-12 NOTE — Telephone Encounter (Signed)
RN spoke with B. Jeraldine Loots.  Pt will need to call B. Jeraldine Loots to bring in paperwork to complete [facility] application.  Pt given phone number for B. Jeraldine Loots to arrange appointment to complete application.  Pt stated that he would call.

## 2012-12-13 ENCOUNTER — Encounter (INDEPENDENT_AMBULATORY_CARE_PROVIDER_SITE_OTHER): Payer: Self-pay | Admitting: General Surgery

## 2012-12-13 ENCOUNTER — Encounter (HOSPITAL_BASED_OUTPATIENT_CLINIC_OR_DEPARTMENT_OTHER): Payer: Self-pay | Admitting: *Deleted

## 2012-12-13 ENCOUNTER — Ambulatory Visit (INDEPENDENT_AMBULATORY_CARE_PROVIDER_SITE_OTHER): Payer: Self-pay | Admitting: General Surgery

## 2012-12-13 VITALS — BP 190/110 | HR 96 | Temp 98.2°F | Resp 14 | Ht 76.0 in | Wt 240.8 lb

## 2012-12-13 DIAGNOSIS — R198 Other specified symptoms and signs involving the digestive system and abdomen: Secondary | ICD-10-CM

## 2012-12-13 DIAGNOSIS — K6289 Other specified diseases of anus and rectum: Secondary | ICD-10-CM

## 2012-12-13 MED ORDER — LIDOCAINE HCL 2 % EX GEL: CUTANEOUS | Status: DC

## 2012-12-13 MED ORDER — OXYCODONE-ACETAMINOPHEN 5-325 MG PO TABS: 2.0000 | ORAL_TABLET | ORAL | Status: DC | PRN

## 2012-12-13 NOTE — Progress Notes (Signed)
NPO AFTER MN WITH EXCEPTION CLEAR LIQUIDS UNTIL 0900. (NO CREAM/ MILK PRODUCTS).  ARRIVE AT 1400. NEEDS ISTAT AND EKG. MAY TAKE PERCOCET IF NEEDED W/ SIPS OF WATER.

## 2012-12-13 NOTE — Progress Notes (Signed)
Subjective:    Rectal pain and mass  Patient ID: Alan Boyle, male   DOB: 08/04/1980, 32 y.o.   MRN: [id number]  HPI Patient is a 32-year-old male who presents with gradually worsening rectal pain and prolapsing mass. Patient has a history of HIV managed in the infectious disease clinic. He has sex with men and frequent anal intercourse. He has noticed increasing pain and a prolapsing mass in his anus. In the past few weeks has gotten quite large and now continually painful. He is having trouble getting it back inside. There is bleeding. He has no previous history of anal rectal disease or surgery. He uses enemas for bowel movements and there is no severe constipation or diarrhea. No dominant pain.  Past Medical History  Diagnosis Date  . Hypertension   . Diabetes mellitus   . HIV disease    History reviewed. No pertinent past surgical history. Current Outpatient Prescriptions  Medication Sig Dispense Refill  . efavirenz-emtricitabine-tenofovir (ATRIPLA) 600-200-300 MG per tablet Take 1 tablet by mouth daily.  30 tablet  6  . oxyCODONE-acetaminophen (PERCOCET) 5-325 MG per tablet Take 2 tablets by mouth every 4 (four) hours as needed for pain.  10 tablet  0   No current facility-administered medications for this visit.   No Known Allergies History  Substance Use Topics  . Smoking status: Current Every Day Smoker    Types: Cigars  . Smokeless tobacco: Never Used  . Alcohol Use: No     Review of Systems  HENT: Negative.   Respiratory: Negative.   Cardiovascular: Negative.   Gastrointestinal: Positive for blood in stool, anal bleeding and rectal pain. Negative for nausea, vomiting, abdominal pain, diarrhea, constipation and abdominal distention.       Objective:   Physical Exam BP 190/110  Pulse 96  Temp(Src) 98.2 F (36.8 C) (Temporal)  Resp 14  Ht 6' 4" (1.93 m)  Wt 240 lb 12.8 oz (109.226 kg)  BMI 29.32 kg/m2 General: Mildly overweight Afro-American male who is  uncomfortable Skin: No rash or infection Lungs: Clear equal breath sounds bilaterally Cardiac regular rate and rhythm. No murmurs Abdomen: Soft and nontender. No distention. Rectal: On external rectal exam there is a prolapsed cauliflower appearing mass with slight oozing of blood which measures at least 6 cm in diameter. At the base. This swelling at the anal verge, possibly thrombosed hemorrhoids.  No evidence of infection. It is extremely tender on exam and further manipulation or digital rectal exam is impossible. Neurologic: Alert and fully oriented. No gross motor deficits.    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 This appears to be possibly an extremely large condyloma or possibly anal cancer. He may well have associated thrombosed hemorrhoids.  He has poorly controlled hypertension. He was on antihypertensives and diabetic medications but could not afford them. He is currently reestablishing primary care and he understands that he needs to see them urgently for these issues. I think his blood pressure is up further from being in the surgeon's office today and recent office visits show pressure of about 150/90.    Plan:     I have recommended thorough exam under general anesthesia hopefully excision of this mass. I told him I could not tell him for sure what we would do at the time of surgery until we are able to do a more thorough exam under anesthesia. It is possible we may do just a biopsy. Risks of anesthetic complications   of bleeding and infection were discussed and understood. Our colorectal surgeon Dr. Thomas will assist. He is being scheduled for our next OR day this coming Monday.  Urgent family care followup for hypertension and diabetes.      

## 2012-12-13 NOTE — Addendum Note (Signed)
Addended by: Glenna Fellows T on: 12/13/2012 02:14 PM   Modules accepted: Orders

## 2012-12-16 ENCOUNTER — Encounter (HOSPITAL_BASED_OUTPATIENT_CLINIC_OR_DEPARTMENT_OTHER): Payer: Self-pay | Admitting: Anesthesiology

## 2012-12-16 ENCOUNTER — Ambulatory Visit (HOSPITAL_BASED_OUTPATIENT_CLINIC_OR_DEPARTMENT_OTHER): Payer: Medicaid Other | Admitting: Anesthesiology

## 2012-12-16 ENCOUNTER — Ambulatory Visit (HOSPITAL_BASED_OUTPATIENT_CLINIC_OR_DEPARTMENT_OTHER)
Admission: RE | Admit: 2012-12-16 | Discharge: 2012-12-16 | Disposition: A | Discharge status: Home / Self Care | Payer: Self-pay | Source: Ambulatory Visit | Attending: General Surgery | Admitting: General Surgery

## 2012-12-16 ENCOUNTER — Encounter (HOSPITAL_BASED_OUTPATIENT_CLINIC_OR_DEPARTMENT_OTHER)
Admission: RE | Disposition: A | Discharge status: Home / Self Care | Payer: Self-pay | Source: Ambulatory Visit | Attending: General Surgery

## 2012-12-16 ENCOUNTER — Encounter (HOSPITAL_BASED_OUTPATIENT_CLINIC_OR_DEPARTMENT_OTHER): Payer: Self-pay

## 2012-12-16 DIAGNOSIS — I10 Essential (primary) hypertension: Secondary | ICD-10-CM | POA: Insufficient documentation

## 2012-12-16 DIAGNOSIS — E119 Type 2 diabetes mellitus without complications: Secondary | ICD-10-CM | POA: Insufficient documentation

## 2012-12-16 DIAGNOSIS — Z21 Asymptomatic human immunodeficiency virus [HIV] infection status: Secondary | ICD-10-CM | POA: Insufficient documentation

## 2012-12-16 DIAGNOSIS — K6289 Other specified diseases of anus and rectum: Secondary | ICD-10-CM

## 2012-12-16 DIAGNOSIS — A630 Anogenital (venereal) warts: Secondary | ICD-10-CM | POA: Insufficient documentation

## 2012-12-16 HISTORY — DX: Other specified diseases of anus and rectum: K62.89

## 2012-12-16 HISTORY — PX: TUMOR EXCISION: SHX421

## 2012-12-16 LAB — POCT I-STAT 4, (NA,K, GLUC, HGB,HCT)
Glucose, Bld: 113 mg/dL — ABNORMAL HIGH (ref 70–99)
HCT: 32 % — ABNORMAL LOW (ref 39.0–52.0)

## 2012-12-16 SURGERY — EXCISION, NEOPLASM, RECTUM
Anesthesia: General | Site: Rectum | Wound class: Contaminated

## 2012-12-16 MED ORDER — KETOROLAC TROMETHAMINE 30 MG/ML IJ SOLN
INTRAMUSCULAR | Status: DC | PRN
  Administered 2012-12-16: 30 mg via INTRAVENOUS

## 2012-12-16 MED ORDER — HYDROMORPHONE HCL PF 1 MG/ML IJ SOLN
0.2500 mg | INTRAMUSCULAR | Status: DC | PRN
Start: 2012-12-16 — End: 2012-12-16
  Administered 2012-12-16: 0.25 mg via INTRAVENOUS
  Filled 2012-12-16: qty 1

## 2012-12-16 MED ORDER — LIDOCAINE 5 % EX OINT
TOPICAL_OINTMENT | CUTANEOUS | Status: DC | PRN
  Administered 2012-12-16: 1

## 2012-12-16 MED ORDER — OXYCODONE-ACETAMINOPHEN 5-325 MG PO TABS
1.0000 | ORAL_TABLET | ORAL | Status: DC | PRN
  Administered 2012-12-16: 1 via ORAL
  Filled 2012-12-16: qty 1

## 2012-12-16 MED ORDER — LABETALOL HCL 5 MG/ML IV SOLN
INTRAVENOUS | Status: DC | PRN
  Administered 2012-12-16 (×4): 5 mg via INTRAVENOUS

## 2012-12-16 MED ORDER — FENTANYL CITRATE 0.05 MG/ML IJ SOLN
INTRAMUSCULAR | Status: DC | PRN
  Administered 2012-12-16: 100 ug via INTRAVENOUS
  Administered 2012-12-16: 50 ug via INTRAVENOUS
  Administered 2012-12-16: 100 ug via INTRAVENOUS

## 2012-12-16 MED ORDER — LABETALOL HCL 5 MG/ML IV SOLN
5.0000 mg | INTRAVENOUS | Status: DC | PRN
  Administered 2012-12-16 (×2): 5 mg via INTRAVENOUS
  Filled 2012-12-16: qty 4

## 2012-12-16 MED ORDER — NEOSTIGMINE METHYLSULFATE 1 MG/ML IJ SOLN
INTRAMUSCULAR | Status: DC | PRN
  Administered 2012-12-16: 3 mg via INTRAVENOUS

## 2012-12-16 MED ORDER — GLYCOPYRROLATE 0.2 MG/ML IJ SOLN
INTRAMUSCULAR | Status: DC | PRN
  Administered 2012-12-16: 0.4 mg via INTRAVENOUS

## 2012-12-16 MED ORDER — BUPIVACAINE LIPOSOME 1.3 % IJ SUSP
INTRAMUSCULAR | Status: DC | PRN
  Administered 2012-12-16: 14 mL

## 2012-12-16 MED ORDER — 0.9 % SODIUM CHLORIDE (POUR BTL) OPTIME
TOPICAL | Status: DC | PRN
  Administered 2012-12-16: 500 mL

## 2012-12-16 MED ORDER — LACTATED RINGERS IV SOLN
INTRAVENOUS | Status: DC
  Administered 2012-12-16 (×2): via INTRAVENOUS
  Filled 2012-12-16: qty 1000

## 2012-12-16 MED ORDER — GLYCOPYRROLATE 0.2 MG/ML IJ SOLN
INTRAMUSCULAR | Status: DC | PRN
  Administered 2012-12-16: 0.2 mg via INTRAVENOUS

## 2012-12-16 MED ORDER — MEPERIDINE HCL 25 MG/ML IJ SOLN
6.2500 mg | INTRAMUSCULAR | Status: DC | PRN
  Filled 2012-12-16: qty 1

## 2012-12-16 MED ORDER — OXYCODONE-ACETAMINOPHEN 5-325 MG PO TABS: 1.0000 | ORAL_TABLET | ORAL | Status: DC | PRN

## 2012-12-16 MED ORDER — DEXTROSE 5 % IV SOLN
2.0000 g | INTRAVENOUS | Status: AC
  Administered 2012-12-16: 2 g via INTRAVENOUS
  Filled 2012-12-16: qty 2

## 2012-12-16 MED ORDER — PROMETHAZINE HCL 25 MG/ML IJ SOLN
6.2500 mg | INTRAMUSCULAR | Status: DC | PRN
  Filled 2012-12-16: qty 1

## 2012-12-16 MED ORDER — PROPOFOL 10 MG/ML IV BOLUS
INTRAVENOUS | Status: DC | PRN
  Administered 2012-12-16: 50 mg via INTRAVENOUS
  Administered 2012-12-16: 100 mg via INTRAVENOUS
  Administered 2012-12-16: 300 mg via INTRAVENOUS

## 2012-12-16 MED ORDER — OXYCODONE HCL 5 MG PO TABS
5.0000 mg | ORAL_TABLET | Freq: Once | ORAL | Status: DC | PRN
  Filled 2012-12-16: qty 1

## 2012-12-16 MED ORDER — LIDOCAINE HCL (CARDIAC) 20 MG/ML IV SOLN
INTRAVENOUS | Status: DC | PRN
  Administered 2012-12-16: 80 mg via INTRAVENOUS

## 2012-12-16 MED ORDER — OXYCODONE HCL 5 MG/5ML PO SOLN
5.0000 mg | Freq: Once | ORAL | Status: DC | PRN
  Filled 2012-12-16: qty 5

## 2012-12-16 MED ORDER — ROCURONIUM BROMIDE 100 MG/10ML IV SOLN
INTRAVENOUS | Status: DC | PRN
  Administered 2012-12-16: 50 mg via INTRAVENOUS

## 2012-12-16 MED ORDER — MIDAZOLAM HCL 5 MG/5ML IJ SOLN
INTRAMUSCULAR | Status: DC | PRN
  Administered 2012-12-16: 2 mg via INTRAVENOUS

## 2012-12-16 SURGICAL SUPPLY — 47 items
BAND RUBBER #16 2-1/2X1/16 STR (MISCELLANEOUS) IMPLANT
BENZOIN TINCTURE PRP APPL 2/3 (GAUZE/BANDAGES/DRESSINGS) ×4 IMPLANT
BLADE MINI RND TIP GREEN BEAV (BLADE) IMPLANT
BLADE SURG 15 STRL LF DISP TIS (BLADE) ×1 IMPLANT
BLADE SURG 15 STRL SS (BLADE) ×1
CANISTER SUCTION 1200CC (MISCELLANEOUS) IMPLANT
CLEANER CAUTERY TIP 5X5 PAD (MISCELLANEOUS) ×1 IMPLANT
CLOTH BEACON ORANGE TIMEOUT ST (SAFETY) ×2 IMPLANT
COVER MAYO STAND STRL (DRAPES) IMPLANT
DECANTER SPIKE VIAL GLASS SM (MISCELLANEOUS) ×2 IMPLANT
DRAIN PENROSE 18X1/2 LTX STRL (DRAIN) IMPLANT
DRAPE UTILITY XL STRL (DRAPES) ×2 IMPLANT
DRSG PAD ABDOMINAL 8X10 ST (GAUZE/BANDAGES/DRESSINGS) ×2 IMPLANT
ELECT REM PT RETURN 9FT ADLT (ELECTROSURGICAL) ×2
ELECTRODE REM PT RTRN 9FT ADLT (ELECTROSURGICAL) ×1 IMPLANT
GAUZE SPONGE 4X4 12PLY STRL LF (GAUZE/BANDAGES/DRESSINGS) ×2 IMPLANT
GLOVE BIO SURGEON STRL SZ 6.5 (GLOVE) ×2 IMPLANT
GLOVE BIOGEL PI IND STRL 8 (GLOVE) ×1 IMPLANT
GLOVE BIOGEL PI INDICATOR 8 (GLOVE) ×1
GLOVE ECLIPSE 7.5 STRL STRAW (GLOVE) ×2 IMPLANT
GLOVE INDICATOR 7.0 STRL GRN (GLOVE) ×2 IMPLANT
GLOVE INDICATOR 7.5 STRL GRN (GLOVE) ×2 IMPLANT
GLOVE SS BIOGEL STRL SZ 7.5 (GLOVE) ×1 IMPLANT
GLOVE SUPERSENSE BIOGEL SZ 7.5 (GLOVE) ×1
GOWN PREVENTION PLUS XLARGE (GOWN DISPOSABLE) ×4 IMPLANT
GOWN PREVENTION PLUS XXLARGE (GOWN DISPOSABLE) ×2 IMPLANT
NEEDLE HYPO 22GX1.5 SAFETY (NEEDLE) ×2 IMPLANT
NEEDLE HYPO 25X1 1.5 SAFETY (NEEDLE) ×2 IMPLANT
PACK BASIN DAY SURGERY FS (CUSTOM PROCEDURE TRAY) ×2 IMPLANT
PACK LITHOTOMY IV (CUSTOM PROCEDURE TRAY) ×2 IMPLANT
PAD CLEANER CAUTERY TIP 5X5 (MISCELLANEOUS) ×1
PENCIL BUTTON HOLSTER BLD 10FT (ELECTRODE) ×2 IMPLANT
SPONGE SURGIFOAM ABS GEL 100 (HEMOSTASIS) ×2 IMPLANT
SPONGE SURGIFOAM ABS GEL 12-7 (HEMOSTASIS) ×2 IMPLANT
SURGILUBE 2OZ TUBE FLIPTOP (MISCELLANEOUS) ×8 IMPLANT
SUT CHROMIC 3 0 SH 27 (SUTURE) ×4 IMPLANT
SUT SILK 2 0 TIES 17X18 (SUTURE)
SUT SILK 2-0 18XBRD TIE BLK (SUTURE) IMPLANT
SUT VICRYL 3-0 CR8 SH (SUTURE) IMPLANT
SYR CONTROL 10ML LL (SYRINGE) ×4 IMPLANT
TOWEL OR 17X24 6PK STRL BLUE (TOWEL DISPOSABLE) ×4 IMPLANT
TOWEL OR NON WOVEN STRL DISP B (DISPOSABLE) ×4 IMPLANT
TRAY DSU PREP LF (CUSTOM PROCEDURE TRAY) ×2 IMPLANT
TRAY PROCTOSCOPIC FIBER OPTIC (SET/KITS/TRAYS/PACK) IMPLANT
TUBE CONNECTING 12X1/4 (SUCTIONS) IMPLANT
UNDERPAD 30X30 INCONTINENT (UNDERPADS AND DIAPERS) ×2 IMPLANT
YANKAUER SUCT BULB TIP NO VENT (SUCTIONS) IMPLANT

## 2012-12-16 NOTE — Transfer of Care (Signed)
Immediate Anesthesia Transfer of Care Note  Patient: Alan Boyle  Procedure(s) Performed: Procedure(s): EXAM UNDER ANESTHESIA AND REMOVAL OF RECTAL MASS (N/A)  Patient Location: PACU  Anesthesia Type:General  Level of Consciousness: sedated  Airway & Oxygen Therapy: Patient Spontanous Breathing and Patient connected to face mask oxygen  Post-op Assessment: Report given to PACU RN  Post vital signs: Initial BP elevated, Labetolol 5mg  @ 4:50, second dose 5mg  at 1600 for 177/106.    Complications: No apparent anesthesia complications

## 2012-12-16 NOTE — Anesthesia Procedure Notes (Signed)
Procedure Name: Intubation Date/Time: 12/16/2012 3:39 PM Performed by: Maris Berger T Pre-anesthesia Checklist: Patient identified, Emergency Drugs available, Suction available and Patient being monitored Patient Re-evaluated:Patient Re-evaluated prior to inductionOxygen Delivery Method: Circle System Utilized Preoxygenation: Pre-oxygenation with 100% oxygen Intubation Type: IV induction Ventilation: Mask ventilation without difficulty Laryngoscope Size: Mac and 4 Grade View: Grade II Tube type: Oral Tube size: 8.0 mm Number of attempts: 1 Airway Equipment and Method: stylet Placement Confirmation: ETT inserted through vocal cords under direct vision,  positive ETCO2 and breath sounds checked- equal and bilateral Secured at: 23 cm Tube secured with: Tape Dental Injury: Teeth and Oropharynx as per pre-operative assessment

## 2012-12-16 NOTE — Op Note (Signed)
Preoperative Diagnosis: rectal mass  Postoprative Diagnosis: Large prolapsed anal condylomata  Procedure: Procedure(s): EXAM UNDER ANESTHESIA AND REMOVAL OF PROLAPSED BULKY ANAL CONDYLOMATA   Surgeon: Glenna Fellows T   Assistants: Lendon Ka  Anesthesia:  General endotracheal anesthesia  Indications: Patient is a 32 year old homosexual male, HIV positive, who who has had an increasing Rectal mass present for at least a year. It used to be reducible but in recent weeks has been chronically prolapsed and bleeding and increasingly painful. Examination the office showed a large, proximally 6 cm cauliflower appearing mass prolapsing through the anal canal and I was unable to do any more extensive exam due to pain. I therefore recommended exam under anesthesia with likely excision of the rectal mass based on operative findings. We discussed the nature of the procedure and indications as well as risks of general anesthesia, bleeding, recurrent condyloma. He understands and agrees to proceed    Procedure Detail:  Patient was brought to the operating room, general endotracheal anesthesia induced on the stretcher And then he was rolled Into padded prone jackknife position. The buttocks were taped apart in the perineum widely sterilely prepped and draped. Patient timeout was performed the correct procedure verified. He received preoperative IV antibiotics. PAS were in place. There was again noted approximately 6 cm multilobulated polyp lower appearing mass which was broad based and extending down to the anal canal. Retractors were placed and this was based Broadly on the anal rectal mucosa that had prolapsed down through the anal canal. There were other multiple condyloma less than 1 cm green anal canal itself appeared to be just above the dentate line. This mass was in the posterior midline. Were able to elevate and isolate this and I placed an initial 2-0 chromic suture at the apex of the base of the  mass. I then elliptically excised the large mass with some underlying rectal mucosa and submucosa extending out onto the anoderm while identifying and avoiding the sphincter muscles. Hemostasis was obtained with cautery and then the 2-0 chromic suture previously placed was used in a running fashion to close the defect and for hemostasis. Following this we are excised or fulgurated multiple sub-1 cm condyloma the circumferentially which again all appeared to be up on the mucosa above the dentate line and Foley one being externally on the anoderm. At the end of the procedure there were minimal if any remaining lesions. There was no active bleeding. A Gelfoam sponge was placed. A perianal block was performed with Exparel. Gauze dressing was applied and the patient taken to recovery in stable condition.    Findings: As above  Estimated Blood Loss:  less than 50 mL         Drains: none  Blood Given: none          Specimens: Multiple anorectal masses likely condylomata        Complications:  * No complications entered in OR log *         Disposition: PACU - hemodynamically stable.         Condition: stable

## 2012-12-16 NOTE — Interval H&P Note (Signed)
History and Physical Interval Note:  12/16/2012 3:24 PM  Alan Boyle  has presented today for surgery, with the diagnosis of rectal mass  The various methods of treatment have been discussed with the patient and family. After consideration of risks, benefits and other options for treatment, the patient has consented to  Procedure(s): EXAM UNDER ANESTHESIA AND REMOVAL OF RECTAL MASS (N/A) as a surgical intervention .  The patient's history has been reviewed, patient examined, no change in status, stable for surgery.  I have reviewed the patient's chart and labs.  Questions were answered to the patient's satisfaction.     Langley Flatley T

## 2012-12-16 NOTE — Transfer of Care (Deleted)
Immediate Anesthesia Transfer of Care Note  Patient: Alan Boyle  Procedure(s) Performed: Procedure(s): EXAM UNDER ANESTHESIA AND REMOVAL OF RECTAL MASS (N/A)  Patient Location: PACU  Anesthesia Type:General  Level of Consciousness: awake  Airway & Oxygen Therapy: Patient Spontanous Breathing and Patient connected to face mask oxygen  Post-op Assessment: Report given to PACU RN  Post vital signs: Reviewed and stable  Complications: No apparent anesthesia complications

## 2012-12-16 NOTE — Anesthesia Preprocedure Evaluation (Addendum)
Anesthesia Evaluation  Patient identified by MRN, date of birth, ID band Patient awake    Reviewed: Allergy & Precautions, H&P , NPO status , Patient's Chart, lab work & pertinent test results  Airway Mallampati: II TM Distance: >3 FB Neck ROM: Full    Dental  (+) Dental Advisory Given and Teeth Intact   Pulmonary Current Smoker,  breath sounds clear to auscultation        Cardiovascular hypertension, Rhythm:Regular Rate:Normal  LVH by EKG voltage   Neuro/Psych PSYCHIATRIC DISORDERS Depression negative neurological ROS     GI/Hepatic negative GI ROS, Neg liver ROS,   Endo/Other  negative endocrine ROSdiabetes, Type 2, Oral Hypoglycemic Agents  Renal/GU negative Renal ROS     Musculoskeletal negative musculoskeletal ROS (+)   Abdominal   Peds  Hematology negative hematology ROS (+) HIV,   Anesthesia Other Findings   Reproductive/Obstetrics                          Anesthesia Physical Anesthesia Plan  ASA: II  Anesthesia Plan: General   Post-op Pain Management:    Induction: Intravenous  Airway Management Planned: Oral ETT  Additional Equipment:   Intra-op Plan:   Post-operative Plan: Extubation in OR  Informed Consent: I have reviewed the patients History and Physical, chart, labs and discussed the procedure including the risks, benefits and alternatives for the proposed anesthesia with the patient or authorized representative who has indicated his/her understanding and acceptance.   Dental advisory given  Plan Discussed with: CRNA  Anesthesia Plan Comments:         Anesthesia Quick Evaluation

## 2012-12-16 NOTE — H&P (View-Only) (Signed)
Subjective:    Rectal pain and mass  Patient ID: Alan Boyle, male   DOB: [birth year], 32 y.o.   MRN: [id number]  HPI Patient is a 32 year old male who presents with gradually worsening rectal pain and prolapsing mass. Patient has a history of HIV managed in the infectious disease clinic. He has sex with men and frequent anal intercourse. He has noticed increasing pain and a prolapsing mass in his anus. In the past few weeks has gotten quite large and now continually painful. He is having trouble getting it back inside. There is bleeding. He has no previous history of anal rectal disease or surgery. He uses enemas for bowel movements and there is no severe constipation or diarrhea. No dominant pain.  Past Medical History  Diagnosis Date  . Hypertension   . Diabetes mellitus   . HIV disease    History reviewed. No pertinent past surgical history. Current Outpatient Prescriptions  Medication Sig Dispense Refill  . efavirenz-emtricitabine-tenofovir (ATRIPLA) 600-200-300 MG per tablet Take 1 tablet by mouth daily.  30 tablet  6  . oxyCODONE-acetaminophen (PERCOCET) 5-325 MG per tablet Take 2 tablets by mouth every 4 (four) hours as needed for pain.  10 tablet  0   No current facility-administered medications for this visit.   No Known Allergies History  Substance Use Topics  . Smoking status: Current Every Day Smoker    Types: Cigars  . Smokeless tobacco: Never Used  . Alcohol Use: No     Review of Systems  HENT: Negative.   Respiratory: Negative.   Cardiovascular: Negative.   Gastrointestinal: Positive for blood in stool, anal bleeding and rectal pain. Negative for nausea, vomiting, abdominal pain, diarrhea, constipation and abdominal distention.       Objective:   Physical Exam BP 190/110  Pulse 96  Temp(Src) 98.2 F (36.8 C) (Temporal)  Resp 14  Ht 6\' 4"  (1.93 m)  Wt 240 lb 12.8 oz (109.226 kg)  BMI 29.32 kg/m2 General: Mildly overweight Afro-American male who is  uncomfortable Skin: No rash or infection Lungs: Clear equal breath sounds bilaterally Cardiac regular rate and rhythm. No murmurs Abdomen: Soft and nontender. No distention. Rectal: On external rectal exam there is a prolapsed cauliflower appearing mass with slight oozing of blood which measures at least 6 cm in diameter. At the base. This swelling at the anal verge, possibly thrombosed hemorrhoids.  No evidence of infection. It is extremely tender on exam and further manipulation or digital rectal exam is impossible. Neurologic: Alert and fully oriented. No gross motor deficits.    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 This appears to be possibly an extremely large condyloma or possibly anal cancer. He may well have associated thrombosed hemorrhoids.  He has poorly controlled hypertension. He was on antihypertensives and diabetic medications but could not afford them. He is currently reestablishing primary care and he understands that he needs to see them urgently for these issues. I think his blood pressure is up further from being in the surgeon's office today and recent office visits show pressure of about 150/90.    Plan:     I have recommended thorough exam under general anesthesia hopefully excision of this mass. I told him I could not tell him for sure what we would do at the time of surgery until we are able to do a more thorough exam under anesthesia. It is possible we may do just a biopsy. Risks of anesthetic complications  of bleeding and infection were discussed and understood. Our colorectal surgeon Dr. Maisie Fus will assist. He is being scheduled for our next OR day this coming Monday.  Urgent family care followup for hypertension and diabetes.

## 2012-12-16 NOTE — Anesthesia Postprocedure Evaluation (Signed)
  Anesthesia Post-op Note  Patient: Alan Boyle  Procedure(s) Performed: Procedure(s) (LRB): EXAM UNDER ANESTHESIA AND REMOVAL OF RECTAL MASS (N/A)  Patient Location: PACU  Anesthesia Type: General  Level of Consciousness: awake and alert   Airway and Oxygen Therapy: Patient Spontanous Breathing  Post-op Pain: mild  Post-op Assessment: Post-op Vital signs reviewed, Patient's Cardiovascular Status Stable, Respiratory Function Stable, Patent Airway and No signs of Nausea or vomiting  Last Vitals:  Filed Vitals:   12/16/12 1730  BP: 188/105  Pulse: 77  Temp:   Resp: 13    Post-op Vital Signs: stable   Complications: No apparent anesthesia complications

## 2012-12-17 ENCOUNTER — Encounter (HOSPITAL_BASED_OUTPATIENT_CLINIC_OR_DEPARTMENT_OTHER): Payer: Self-pay | Admitting: General Surgery

## 2012-12-27 ENCOUNTER — Encounter: Payer: Self-pay | Admitting: *Deleted

## 2013-01-03 ENCOUNTER — Ambulatory Visit (INDEPENDENT_AMBULATORY_CARE_PROVIDER_SITE_OTHER): Payer: Self-pay | Admitting: General Surgery

## 2013-01-03 ENCOUNTER — Encounter (INDEPENDENT_AMBULATORY_CARE_PROVIDER_SITE_OTHER): Payer: Self-pay | Admitting: General Surgery

## 2013-01-03 ENCOUNTER — Encounter (INDEPENDENT_AMBULATORY_CARE_PROVIDER_SITE_OTHER): Payer: Self-pay

## 2013-01-03 VITALS — BP 120/70 | HR 60 | Temp 98.1°F | Resp 14 | Ht 74.0 in | Wt 239.2 lb

## 2013-01-03 DIAGNOSIS — A630 Anogenital (venereal) warts: Secondary | ICD-10-CM

## 2013-01-03 NOTE — Progress Notes (Signed)
Alan Boyle is a 32 y.o. male who is status post a resection of a condylomatous mass.  He is doing better. His pain has improved. He still appears to be having constipation. He reports having to strain with bowel movements. He complains of a mass around his anal canal.  Objective: Filed Vitals:   01/03/13 1715  BP: 120/70  Pulse: 60  Temp: 98.1 F (36.7 C)  Resp: 14    General appearance: alert and cooperative GI: normal findings: soft, non-tender  Incision: healing well, he has a swollen hemorrhoid with a open portion of granulation tissue that appears to be healing well.   Assessment: s/p  Patient Active Problem List   Diagnosis Date Noted  . HIV disease   . Obese 08/15/2011  . SYPHILIS 03/29/2010  . HIV DISEASE 05/06/2009  . DIABETES MELLITUS, TYPE II 05/06/2009  . DEPRESSION 05/06/2009  . HYPERTENSION 05/06/2009    Plan: Alan Boyle seems to be doing well after surgery. I have asked him to start MiraLax twice a day to help with his constipation. I've also asked him to start a fiber supplement once a day. He should also drink plenty of fluids. I will see him back in 3-4 weeks to evaluate his progression. I suspect his anal canal should be healed by then.    Vanita Panda, MD [facility], [place] [phone]   01/03/2013 6:14 PM

## 2013-01-03 NOTE — Patient Instructions (Signed)
Take a dose of miralax twice daily.  Stop milk of mag.  Start a fiber supplement once a day and drink plenty of water (64oz a day).

## 2013-01-21 ENCOUNTER — Ambulatory Visit (INDEPENDENT_AMBULATORY_CARE_PROVIDER_SITE_OTHER): Payer: Self-pay | Admitting: General Surgery

## 2013-01-21 ENCOUNTER — Encounter (INDEPENDENT_AMBULATORY_CARE_PROVIDER_SITE_OTHER): Payer: Self-pay | Admitting: General Surgery

## 2013-01-21 VITALS — BP 120/90 | HR 80 | Temp 98.0°F | Resp 15 | Ht 74.0 in | Wt 244.8 lb

## 2013-01-21 DIAGNOSIS — Z9889 Other specified postprocedural states: Secondary | ICD-10-CM

## 2013-01-21 NOTE — Patient Instructions (Signed)
Call the office if you develop any new symptoms. 

## 2013-01-21 NOTE — Progress Notes (Signed)
ZAK GONDEK is a 32 y.o. male who is here for a follow up visit regarding his resection of anal condyloma.  He is feeling better.  The Miralax is helping him have easier BM's.    Objective: Filed Vitals:   01/21/13 1127  BP: 120/90  Pulse: 80  Temp: 98 F (36.7 C)  Resp: 15    General appearance: alert and cooperative anal: healing well, no recurrence noted   Assessment and Plan: Healing well after surgery.  Pt offered routine f/u for his condyloma but he declined.  Will call the office if develops new symptoms.      Vanita Panda, MD [facility], [place] [phone]

## 2013-02-25 ENCOUNTER — Other Ambulatory Visit: Payer: Self-pay

## 2013-02-28 ENCOUNTER — Encounter (INDEPENDENT_AMBULATORY_CARE_PROVIDER_SITE_OTHER): Payer: Self-pay | Admitting: General Surgery

## 2013-03-11 ENCOUNTER — Ambulatory Visit: Payer: Self-pay | Admitting: Internal Medicine

## 2013-03-20 ENCOUNTER — Ambulatory Visit: Payer: Self-pay | Admitting: Internal Medicine

## 2013-04-14 ENCOUNTER — Telehealth: Payer: Self-pay | Admitting: *Deleted

## 2013-04-14 ENCOUNTER — Ambulatory Visit: Payer: Medicaid Other | Admitting: Internal Medicine

## 2013-04-14 NOTE — Telephone Encounter (Signed)
Called patient and left voice mail to call clinic to reschedule his MD and lab appt. He no showed for both. Also advised he needed to renew his [employer]. Wendall MolaJacqueline Brileigh Sevcik

## 2013-04-17 ENCOUNTER — Other Ambulatory Visit (INDEPENDENT_AMBULATORY_CARE_PROVIDER_SITE_OTHER): Payer: Self-pay

## 2013-04-17 ENCOUNTER — Ambulatory Visit: Payer: Medicaid Other

## 2013-04-17 DIAGNOSIS — B20 Human immunodeficiency virus [HIV] disease: Secondary | ICD-10-CM

## 2013-04-18 ENCOUNTER — Encounter: Payer: Self-pay | Admitting: *Deleted

## 2013-04-18 ENCOUNTER — Telehealth: Payer: Self-pay | Admitting: *Deleted

## 2013-04-18 DIAGNOSIS — B20 Human immunodeficiency virus [HIV] disease: Secondary | ICD-10-CM

## 2013-04-18 LAB — T-HELPER CELL (CD4) - (RCID CLINIC ONLY)
CD4 T CELL ABS: 390 /uL — AB (ref 400–2700)
CD4 T CELL HELPER: 29 % — AB (ref 33–55)

## 2013-04-18 LAB — HIV-1 RNA QUANT-NO REFLEX-BLD
HIV 1 RNA Quant: <20 copies/mL (ref <20)
HIV-1 RNA Quant, Log: <1.3 {Log} (ref <1.30)

## 2013-04-18 MED ORDER — EFAVIRENZ-EMTRICITAB-TENOFOVIR 600-200-300 MG PO TABS: 1.0000 | ORAL_TABLET | Freq: Every day | ORAL | Status: DC

## 2013-04-18 NOTE — Telephone Encounter (Signed)
Opened in error

## 2013-04-18 NOTE — Telephone Encounter (Signed)
Rx printed for ADAP application.

## 2013-05-06 ENCOUNTER — Ambulatory Visit: Payer: Medicaid Other | Admitting: Internal Medicine

## 2013-05-06 ENCOUNTER — Telehealth: Payer: Self-pay | Admitting: *Deleted

## 2013-05-06 NOTE — Telephone Encounter (Signed)
Requested pt call for a new appt. 

## 2013-05-15 ENCOUNTER — Telehealth: Payer: Self-pay | Admitting: *Deleted

## 2013-05-15 NOTE — Telephone Encounter (Signed)
[facility] called to see if we had another number for the patient, as they are trying to reach him for delivery.  [facility] has the same contact information we do. Andree CossHowell, Vishwa Dais M, RN

## 2013-07-16 ENCOUNTER — Other Ambulatory Visit: Payer: Self-pay | Admitting: Internal Medicine

## 2013-08-12 ENCOUNTER — Other Ambulatory Visit: Payer: Self-pay | Admitting: Internal Medicine

## 2013-08-13 ENCOUNTER — Other Ambulatory Visit: Payer: Self-pay | Admitting: Internal Medicine

## 2013-08-14 ENCOUNTER — Telehealth: Payer: Self-pay | Admitting: *Deleted

## 2013-08-14 NOTE — Telephone Encounter (Signed)
Called patient and left a voice mail for him to call the clinic to schedule a follow up appt, last seen 10/2012. He also needs lab work and may need to renew his ADAP and Juanell Fairly if he does not have insurance. Refill sent for 1 month of Atripla. [name]

## 2013-09-09 ENCOUNTER — Other Ambulatory Visit (INDEPENDENT_AMBULATORY_CARE_PROVIDER_SITE_OTHER): Payer: Self-pay

## 2013-09-09 ENCOUNTER — Telehealth: Payer: Self-pay | Admitting: *Deleted

## 2013-09-09 DIAGNOSIS — B20 Human immunodeficiency virus [HIV] disease: Secondary | ICD-10-CM

## 2013-09-09 LAB — CBC WITH DIFFERENTIAL/PLATELET
BASOS PCT: 1 % (ref 0–1)
Basophils Absolute: 0 10*3/uL (ref 0.0–0.1)
Eosinophils Absolute: 0.1 10*3/uL (ref 0.0–0.7)
Eosinophils Relative: 2 % (ref 0–5)
HEMATOCRIT: 35 % — AB (ref 39.0–52.0)
Hemoglobin: 11.2 g/dL — ABNORMAL LOW (ref 13.0–17.0)
LYMPHS PCT: 47 % — AB (ref 12–46)
Lymphs Abs: 2 10*3/uL (ref 0.7–4.0)
MCH: 23.8 pg — ABNORMAL LOW (ref 26.0–34.0)
MCHC: 32 g/dL (ref 30.0–36.0)
MCV: 74.5 fL — ABNORMAL LOW (ref 78.0–100.0)
MONO ABS: 0.4 10*3/uL (ref 0.1–1.0)
MONOS PCT: 9 % (ref 3–12)
NEUTROS ABS: 1.7 10*3/uL (ref 1.7–7.7)
NEUTROS PCT: 41 % — AB (ref 43–77)
Platelets: 329 10*3/uL (ref 150–400)
RBC: 4.7 MIL/uL (ref 4.22–5.81)
RDW: 18.8 % — ABNORMAL HIGH (ref 11.5–15.5)
WBC: 4.2 10*3/uL (ref 4.0–10.5)

## 2013-09-09 LAB — COMPREHENSIVE METABOLIC PANEL
ALBUMIN: 4 g/dL (ref 3.5–5.2)
ALT: 14 U/L (ref 0–53)
AST: 16 U/L (ref 0–37)
Alkaline Phosphatase: 59 U/L (ref 39–117)
BUN: 11 mg/dL (ref 6–23)
CALCIUM: 8.7 mg/dL (ref 8.4–10.5)
CHLORIDE: 104 meq/L (ref 96–112)
CO2: 31 mEq/L (ref 19–32)
Creat: 0.86 mg/dL (ref 0.50–1.35)
Glucose, Bld: 93 mg/dL (ref 70–99)
POTASSIUM: 3.9 meq/L (ref 3.5–5.3)
SODIUM: 139 meq/L (ref 135–145)
TOTAL PROTEIN: 6.8 g/dL (ref 6.0–8.3)
Total Bilirubin: 0.3 mg/dL (ref 0.2–1.2)

## 2013-09-09 MED ORDER — EFAVIRENZ-EMTRICITAB-TENOFOVIR 600-200-300 MG PO TABS: ORAL_TABLET | ORAL | Status: DC

## 2013-09-09 NOTE — Telephone Encounter (Signed)
Requesting refill for Atripla.  Needs to keep upcoming appt.

## 2013-09-10 LAB — T-HELPER CELL (CD4) - (RCID CLINIC ONLY)
CD4 % Helper T Cell: 30 % — ABNORMAL LOW (ref 33–55)
CD4 T Cell Abs: 700 /uL (ref 400–2700)

## 2013-09-12 LAB — HIV-1 RNA QUANT-NO REFLEX-BLD
HIV 1 RNA Quant: <20 copies/mL (ref <20)
HIV-1 RNA Quant, Log: <1.3 {Log} (ref <1.30)

## 2013-09-18 ENCOUNTER — Other Ambulatory Visit: Payer: Self-pay | Admitting: *Deleted

## 2013-09-18 DIAGNOSIS — B20 Human immunodeficiency virus [HIV] disease: Secondary | ICD-10-CM

## 2013-09-18 MED ORDER — EFAVIRENZ-EMTRICITAB-TENOFOVIR 600-200-300 MG PO TABS: ORAL_TABLET | ORAL | Status: DC

## 2013-09-18 NOTE — Progress Notes (Signed)
ADAP application 

## 2013-09-26 ENCOUNTER — Encounter: Payer: Self-pay | Admitting: Internal Medicine

## 2013-09-26 ENCOUNTER — Ambulatory Visit (INDEPENDENT_AMBULATORY_CARE_PROVIDER_SITE_OTHER): Payer: Self-pay | Admitting: Internal Medicine

## 2013-09-26 VITALS — BP 161/105 | HR 87 | Temp 98.6°F | Wt 237.0 lb

## 2013-09-26 DIAGNOSIS — I10 Essential (primary) hypertension: Secondary | ICD-10-CM

## 2013-09-26 DIAGNOSIS — E119 Type 2 diabetes mellitus without complications: Secondary | ICD-10-CM

## 2013-09-26 DIAGNOSIS — B20 Human immunodeficiency virus [HIV] disease: Secondary | ICD-10-CM

## 2013-09-26 DIAGNOSIS — F17200 Nicotine dependence, unspecified, uncomplicated: Secondary | ICD-10-CM

## 2013-09-26 MED ORDER — EFAVIRENZ-EMTRICITAB-TENOFOVIR 600-200-300 MG PO TABS: ORAL_TABLET | ORAL | Status: DC

## 2013-09-26 NOTE — Assessment & Plan Note (Signed)
Elevated again.  Will refer for a pcp.

## 2013-09-26 NOTE — Progress Notes (Signed)
  Subjective:    Patient ID: Alan Boyle, male    DOB: [birth year], 33 y.o.   MRN: [id number]  HPI  He comes in for routine followup of his HIV. He continues on Atripla and reports excellent compliance. He has sporadic followup the was last seen in August 2014. He feels well and continues to take the medicine with out any problems. He knows to renew his drug assistance program. He does continue to smoke black and mild cigarettes. He has had no diarrhea or rash.     Review of Systems  Constitutional: Negative for fever, activity change, appetite change, fatigue and unexpected weight change.  HENT: Negative for sore throat and trouble swallowing.   Eyes: Negative for visual disturbance.  Respiratory: Negative for shortness of breath.   Cardiovascular: Negative for leg swelling.  Gastrointestinal: Negative for nausea, abdominal pain and diarrhea.  Musculoskeletal: Negative for arthralgias and myalgias.  Skin: Negative for rash.  Allergic/Immunologic: Negative for immunocompromised state.  Neurological: Negative for dizziness and headaches.  Hematological: Negative for adenopathy.  Psychiatric/Behavioral: Negative for dysphoric mood.       Objective:   Physical Exam  Constitutional: He is oriented to person, place, and time. He appears well-developed and well-nourished. No distress.  HENT:  Mouth/Throat: No oropharyngeal exudate.  Eyes: Right eye exhibits no discharge. Left eye exhibits no discharge. No scleral icterus.  Cardiovascular: Normal rate, regular rhythm and normal heart sounds.   No murmur heard. Pulmonary/Chest: Effort normal and breath sounds normal. No respiratory distress. He has no wheezes.  Lymphadenopathy:    He has no cervical adenopathy.  Neurological: He is alert and oriented to person, place, and time.  Skin: Skin is warm and dry. No rash noted.  Psychiatric: He has a normal mood and affect. His behavior is normal.          Assessment & Plan:

## 2013-09-26 NOTE — Assessment & Plan Note (Signed)
Doing great on Atripla with no issues.  RTC 6 months.

## 2013-09-26 NOTE — Assessment & Plan Note (Signed)
Discussed cessation again and relation to blood pressure.

## 2013-09-26 NOTE — Assessment & Plan Note (Signed)
Blood sugar has been good since his weight loss.

## 2013-09-29 ENCOUNTER — Ambulatory Visit: Payer: Self-pay | Admitting: Family Medicine

## 2013-10-09 ENCOUNTER — Other Ambulatory Visit: Payer: Self-pay | Admitting: Internal Medicine

## 2014-02-14 ENCOUNTER — Telehealth: Payer: Self-pay | Admitting: Infectious Disease

## 2014-02-14 NOTE — Telephone Encounter (Signed)
   Patient missed Friday and Saturday's [facility]. He was very worried because he has not missed a dose in past several years.  I told him to csll clinic on M, Tue if still not received meds and we could help him out

## 2014-02-16 NOTE — Telephone Encounter (Signed)
Left patient a message asking if he got the medication yet.  Per [facility], it shipped 12/3 - Thursday.  He should have received it by Saturday.

## 2014-02-16 NOTE — Telephone Encounter (Signed)
He does not have a follow up appt and should come in for a visit and labs since he was off a few days.  He has a history of no shows.  thanks

## 2014-02-19 ENCOUNTER — Other Ambulatory Visit (INDEPENDENT_AMBULATORY_CARE_PROVIDER_SITE_OTHER): Payer: Self-pay

## 2014-02-19 DIAGNOSIS — Z79899 Other long term (current) drug therapy: Secondary | ICD-10-CM

## 2014-02-19 DIAGNOSIS — B20 Human immunodeficiency virus [HIV] disease: Secondary | ICD-10-CM

## 2014-02-19 DIAGNOSIS — Z113 Encounter for screening for infections with a predominantly sexual mode of transmission: Secondary | ICD-10-CM

## 2014-02-19 LAB — COMPLETE METABOLIC PANEL WITH GFR
ALK PHOS: 58 U/L (ref 39–117)
ALT: 14 U/L (ref 0–53)
AST: 19 U/L (ref 0–37)
Albumin: 3.8 g/dL (ref 3.5–5.2)
BUN: 10 mg/dL (ref 6–23)
CO2: 27 mEq/L (ref 19–32)
Calcium: 8.9 mg/dL (ref 8.4–10.5)
Chloride: 105 mEq/L (ref 96–112)
Creat: 0.89 mg/dL (ref 0.50–1.35)
GFR, Est African American: >89 mL/min
GLUCOSE: 82 mg/dL (ref 70–99)
Potassium: 3.9 mEq/L (ref 3.5–5.3)
Sodium: 138 mEq/L (ref 135–145)
TOTAL PROTEIN: 6.8 g/dL (ref 6.0–8.3)
Total Bilirubin: 0.5 mg/dL (ref 0.2–1.2)

## 2014-02-19 LAB — LIPID PANEL
CHOL/HDL RATIO: 2.7 ratio
Cholesterol: 115 mg/dL (ref 0–200)
HDL: 42 mg/dL (ref >39)
LDL CALC: 66 mg/dL (ref 0–99)
TRIGLYCERIDES: 34 mg/dL (ref <150)
VLDL: 7 mg/dL (ref 0–40)

## 2014-02-19 LAB — CBC WITH DIFFERENTIAL/PLATELET
Basophils Absolute: 0 10*3/uL (ref 0.0–0.1)
Basophils Relative: 1 % (ref 0–1)
EOS ABS: 0.1 10*3/uL (ref 0.0–0.7)
EOS PCT: 3 % (ref 0–5)
HCT: 35.1 % — ABNORMAL LOW (ref 39.0–52.0)
Hemoglobin: 11.5 g/dL — ABNORMAL LOW (ref 13.0–17.0)
LYMPHS PCT: 50 % — AB (ref 12–46)
Lymphs Abs: 1.9 10*3/uL (ref 0.7–4.0)
MCH: 25.7 pg — AB (ref 26.0–34.0)
MCHC: 32.8 g/dL (ref 30.0–36.0)
MCV: 78.5 fL (ref 78.0–100.0)
MPV: 9.1 fL — ABNORMAL LOW (ref 9.4–12.4)
Monocytes Absolute: 0.5 10*3/uL (ref 0.1–1.0)
Monocytes Relative: 13 % — ABNORMAL HIGH (ref 3–12)
Neutro Abs: 1.2 10*3/uL — ABNORMAL LOW (ref 1.7–7.7)
Neutrophils Relative %: 33 % — ABNORMAL LOW (ref 43–77)
Platelets: 303 10*3/uL (ref 150–400)
RBC: 4.47 MIL/uL (ref 4.22–5.81)
RDW: 17.2 % — ABNORMAL HIGH (ref 11.5–15.5)
WBC: 3.7 10*3/uL — ABNORMAL LOW (ref 4.0–10.5)

## 2014-02-19 NOTE — Telephone Encounter (Signed)
Patient came 12/10 for RW/ADAP renewal, got labs.  Pt scheduled for follow up 1/11 with Dr. Luciana Axeomer. Pt states he did not miss a dose - he "found a few pills in older bottles that got him through until the medications were delivered on Monday."

## 2014-02-20 LAB — HIV-1 RNA QUANT-NO REFLEX-BLD
HIV 1 RNA Quant: <20 copies/mL (ref <20)
HIV-1 RNA Quant, Log: <1.3 {Log} (ref <1.30)

## 2014-02-20 LAB — T-HELPER CELL (CD4) - (RCID CLINIC ONLY)
CD4 % Helper T Cell: 33 % (ref 33–55)
CD4 T Cell Abs: 640 /uL (ref 400–2700)

## 2014-02-20 LAB — RPR

## 2014-02-20 LAB — URINE CYTOLOGY ANCILLARY ONLY
Chlamydia: NEGATIVE
Neisseria Gonorrhea: NEGATIVE

## 2014-02-26 ENCOUNTER — Other Ambulatory Visit: Payer: Self-pay | Admitting: *Deleted

## 2014-02-26 MED ORDER — EFAVIRENZ-EMTRICITAB-TENOFOVIR 600-200-300 MG PO TABS: 1.0000 | ORAL_TABLET | Freq: Every day | ORAL | Status: DC

## 2014-02-27 LAB — HLA B*5701: HLA-B*5701 w/rflx HLA-B High: NEGATIVE

## 2014-03-11 ENCOUNTER — Other Ambulatory Visit: Payer: Self-pay | Admitting: Internal Medicine

## 2014-03-11 DIAGNOSIS — B20 Human immunodeficiency virus [HIV] disease: Secondary | ICD-10-CM

## 2014-03-23 ENCOUNTER — Ambulatory Visit: Payer: Self-pay | Admitting: Internal Medicine

## 2014-04-08 ENCOUNTER — Other Ambulatory Visit: Payer: Self-pay | Admitting: Internal Medicine

## 2014-04-08 DIAGNOSIS — Z21 Asymptomatic human immunodeficiency virus [HIV] infection status: Secondary | ICD-10-CM

## 2014-05-07 ENCOUNTER — Other Ambulatory Visit: Payer: Self-pay | Admitting: Infectious Disease

## 2014-05-19 ENCOUNTER — Ambulatory Visit: Payer: Self-pay

## 2014-05-20 ENCOUNTER — Other Ambulatory Visit: Payer: Self-pay | Admitting: *Deleted

## 2014-05-20 MED ORDER — EFAVIRENZ-EMTRICITAB-TENOFOVIR 600-200-300 MG PO TABS: 1.0000 | ORAL_TABLET | Freq: Every day | ORAL | Status: DC

## 2014-06-06 ENCOUNTER — Other Ambulatory Visit: Payer: Self-pay | Admitting: Internal Medicine

## 2014-06-10 ENCOUNTER — Other Ambulatory Visit: Payer: Self-pay | Admitting: *Deleted

## 2014-06-10 ENCOUNTER — Telehealth: Payer: Self-pay | Admitting: *Deleted

## 2014-06-10 DIAGNOSIS — B20 Human immunodeficiency virus [HIV] disease: Secondary | ICD-10-CM

## 2014-06-10 MED ORDER — EFAVIRENZ-EMTRICITAB-TENOFOVIR 600-200-300 MG PO TABS: 1.0000 | ORAL_TABLET | Freq: Every day | ORAL | Status: DC

## 2014-06-10 NOTE — Telephone Encounter (Signed)
Patient called and advised he has not been able to get his meds because his ADAP has not come through. Advised him he can come meet with Pam to see if she can get him a supply of Atripla but will be a 1 in a lifetime supply. He advised he will wait until Monday 06/15/14 and see if it comes through. Also reminded him he can get refill until tomorrow 06/11/14.

## 2014-12-11 NOTE — Progress Notes (Signed)
Faxed approval to [facility]. Howell, Michelle M, RN  

## 2015-01-04 ENCOUNTER — Other Ambulatory Visit: Payer: Self-pay | Admitting: Internal Medicine

## 2015-01-07 ENCOUNTER — Other Ambulatory Visit: Payer: Self-pay

## 2015-01-14 ENCOUNTER — Ambulatory Visit: Payer: Self-pay | Admitting: Internal Medicine

## 2015-01-19 ENCOUNTER — Telehealth: Payer: Self-pay | Admitting: *Deleted

## 2015-01-19 NOTE — Telephone Encounter (Signed)
Patient called stating he is working now and can not come in for an appt. Advised patient to call us back when he can arrange to get off work for an appt. He has been off the Atripla for 1 week. Alan MolaJacqueline Cockerham

## 2015-02-03 ENCOUNTER — Other Ambulatory Visit: Payer: Self-pay | Admitting: *Deleted

## 2015-02-03 ENCOUNTER — Other Ambulatory Visit (INDEPENDENT_AMBULATORY_CARE_PROVIDER_SITE_OTHER): Payer: Self-pay

## 2015-02-03 DIAGNOSIS — Z79899 Other long term (current) drug therapy: Secondary | ICD-10-CM

## 2015-02-03 DIAGNOSIS — B20 Human immunodeficiency virus [HIV] disease: Secondary | ICD-10-CM

## 2015-02-03 DIAGNOSIS — Z113 Encounter for screening for infections with a predominantly sexual mode of transmission: Secondary | ICD-10-CM

## 2015-02-03 LAB — CBC WITH DIFFERENTIAL/PLATELET
Basophils Absolute: 0 10*3/uL (ref 0.0–0.1)
Basophils Relative: 0 % (ref 0–1)
EOS ABS: 0.2 10*3/uL (ref 0.0–0.7)
Eosinophils Relative: 3 % (ref 0–5)
HCT: 42.5 % (ref 39.0–52.0)
HEMOGLOBIN: 13.9 g/dL (ref 13.0–17.0)
LYMPHS ABS: 2.4 10*3/uL (ref 0.7–4.0)
Lymphocytes Relative: 45 % (ref 12–46)
MCH: 28.4 pg (ref 26.0–34.0)
MCHC: 32.7 g/dL (ref 30.0–36.0)
MCV: 86.9 fL (ref 78.0–100.0)
MONO ABS: 0.5 10*3/uL (ref 0.1–1.0)
MONOS PCT: 10 % (ref 3–12)
MPV: 9.7 fL (ref 8.6–12.4)
NEUTROS ABS: 2.3 10*3/uL (ref 1.7–7.7)
NEUTROS PCT: 42 % — AB (ref 43–77)
Platelets: 267 10*3/uL (ref 150–400)
RBC: 4.89 MIL/uL (ref 4.22–5.81)
RDW: 14 % (ref 11.5–15.5)
WBC: 5.4 10*3/uL (ref 4.0–10.5)

## 2015-02-03 LAB — COMPLETE METABOLIC PANEL WITH GFR
ALBUMIN: 4.2 g/dL (ref 3.6–5.1)
ALT: 34 U/L (ref 9–46)
AST: 27 U/L (ref 10–40)
Alkaline Phosphatase: 60 U/L (ref 40–115)
BILIRUBIN TOTAL: 0.4 mg/dL (ref 0.2–1.2)
BUN: 9 mg/dL (ref 7–25)
CO2: 30 mmol/L (ref 20–31)
Calcium: 9.3 mg/dL (ref 8.6–10.3)
Chloride: 102 mmol/L (ref 98–110)
Creat: 0.8 mg/dL (ref 0.60–1.35)
GFR, Est African American: >89 mL/min (ref >=60)
Glucose, Bld: 82 mg/dL (ref 65–99)
Potassium: 4.2 mmol/L (ref 3.5–5.3)
Sodium: 139 mmol/L (ref 135–146)
TOTAL PROTEIN: 7.2 g/dL (ref 6.1–8.1)

## 2015-02-03 LAB — LIPID PANEL
CHOL/HDL RATIO: 2.6 ratio (ref <=5.0)
Cholesterol: 145 mg/dL (ref 125–200)
HDL: 55 mg/dL (ref >=40)
LDL Cholesterol: 78 mg/dL (ref <130)
Triglycerides: 58 mg/dL (ref <150)
VLDL: 12 mg/dL (ref <30)

## 2015-02-03 MED ORDER — EFAVIRENZ-EMTRICITAB-TENOFOVIR 600-200-300 MG PO TABS: 1.0000 | ORAL_TABLET | Freq: Every day | ORAL | Status: DC

## 2015-02-04 LAB — RPR

## 2015-02-05 LAB — T-HELPER CELL (CD4) - (RCID CLINIC ONLY)
CD4 T CELL ABS: 880 /uL (ref 400–2700)
CD4 T CELL HELPER: 32 % — AB (ref 33–55)

## 2015-02-07 LAB — HIV-1 RNA QUANT-NO REFLEX-BLD: HIV 1 RNA Quant: <20 copies/mL (ref <20)

## 2015-03-02 ENCOUNTER — Telehealth: Payer: Self-pay

## 2015-03-02 NOTE — Telephone Encounter (Signed)
Left message for patient to call infectious disease office to schedule an appointment to update ADAP benefits.

## 2015-05-03 ENCOUNTER — Emergency Department (HOSPITAL_COMMUNITY)
Admission: EM | Admit: 2015-05-03 | Discharge: 2015-05-03 | Disposition: A | Discharge status: Home / Self Care | Payer: Self-pay | Attending: Emergency Medicine | Admitting: Emergency Medicine

## 2015-05-03 ENCOUNTER — Encounter (HOSPITAL_COMMUNITY): Payer: Self-pay

## 2015-05-03 ENCOUNTER — Emergency Department (HOSPITAL_COMMUNITY): Payer: Self-pay

## 2015-05-03 DIAGNOSIS — E119 Type 2 diabetes mellitus without complications: Secondary | ICD-10-CM | POA: Insufficient documentation

## 2015-05-03 DIAGNOSIS — Z79899 Other long term (current) drug therapy: Secondary | ICD-10-CM | POA: Insufficient documentation

## 2015-05-03 DIAGNOSIS — Z21 Asymptomatic human immunodeficiency virus [HIV] infection status: Secondary | ICD-10-CM | POA: Insufficient documentation

## 2015-05-03 DIAGNOSIS — F17210 Nicotine dependence, cigarettes, uncomplicated: Secondary | ICD-10-CM | POA: Insufficient documentation

## 2015-05-03 DIAGNOSIS — IMO0001 Reserved for inherently not codable concepts without codable children: Secondary | ICD-10-CM

## 2015-05-03 DIAGNOSIS — M25561 Pain in right knee: Secondary | ICD-10-CM

## 2015-05-03 DIAGNOSIS — M109 Gout, unspecified: Secondary | ICD-10-CM | POA: Insufficient documentation

## 2015-05-03 DIAGNOSIS — I10 Essential (primary) hypertension: Secondary | ICD-10-CM | POA: Insufficient documentation

## 2015-05-03 DIAGNOSIS — R030 Elevated blood-pressure reading, without diagnosis of hypertension: Secondary | ICD-10-CM

## 2015-05-03 DIAGNOSIS — Z8719 Personal history of other diseases of the digestive system: Secondary | ICD-10-CM | POA: Insufficient documentation

## 2015-05-03 MED ORDER — COLCHICINE 0.6 MG PO TABS: ORAL_TABLET | ORAL | Status: DC

## 2015-05-03 MED ORDER — COLCHICINE 0.6 MG PO TABS: 0.6000 mg | ORAL_TABLET | Freq: Every day | ORAL | Status: DC

## 2015-05-03 NOTE — ED Notes (Signed)
Declined W/C at D/C and was escorted to lobby by RN. 

## 2015-05-03 NOTE — ED Notes (Signed)
Patient here with right knee pain x 1 day, pain with any ambulation, denies trauma

## 2015-05-03 NOTE — ED Provider Notes (Signed)
CSN: [id number]     Arrival date & time 05/03/15  1191 History  By signing my name below, I, Ronney Lion, attest that this documentation has been prepared under the direction and in the presence of [facility], PA-C. Electronically Signed: Ronney Lion, ED Scribe. 05/03/2015. 1:19 PM.    Chief Complaint  Patient presents with  . Knee Pain   The history is provided by the patient. No language interpreter was used.    HPI Comments: Alan Boyle is a 35 y.o. male with a history of HIV disease, DM, and hypertension, who presents to the Emergency Department complaining of atraumatic, sudden-onset, right knee pain that he initially felt when stepped out of bed yesterday morning. Patient states he walks frequently but denies any recent changes in activity. Straightening his leg exacerbates his pain. Patient is able to ambulate but with increased pain. Patient states he has not tried any treatments or medications for his symptoms. He denies any known history of gout. He denies any left knee pain. He denies fever or chills. Patient states he does not a PCP currently as his health insurance does not start until March 1st.  Past Medical History  Diagnosis Date  . HIV disease (HCC)   . Rectal mass   . Rectal pain   . Diabetes mellitus     NO MEDS (FINANCIAL ISSUE)  . Hypertension     NO MEDS (FINANCIAL ISSUE)   Past Surgical History  Procedure Laterality Date  . Tumor excision N/A 12/16/2012    Procedure: EXAM UNDER ANESTHESIA AND REMOVAL OF RECTAL MASS;  Surgeon: Mariella Saa, MD;  Location: [facility];  Service: General;  Laterality: N/A;   No family history on file. Social History  Substance Use Topics  . Smoking status: Current Some Day Smoker    Types: Cigars  . Smokeless tobacco: Never Used     Comment: OCCASIONAL CIGAR  . Alcohol Use: No    Review of Systems  Constitutional: Negative for fever and chills.  Musculoskeletal: Positive for arthralgias (right knee  pain).   Allergies  Review of patient's allergies indicates no known allergies.  Home Medications   Prior to Admission medications   Medication Sig Start Date End Date Taking? Authorizing Provider  colchicine 0.6 MG tablet Take two tabs for the first dose, then take one tab daily until pain resolves. 05/03/15   Chase Picket Elizer Bostic, PA-C  efavirenz-emtricitabine-tenofovir (ATRIPLA) 600-200-300 MG tablet Take 1 tablet by mouth daily. 02/03/15   Gardiner Barefoot, MD   BP 177/110 mmHg  Pulse 78  Temp(Src) 98.1 F (36.7 C) (Oral)  Resp 18  SpO2 100% Physical Exam  Constitutional: He is oriented to person, place, and time. He appears well-developed and well-nourished. No distress.  HENT:  Head: Normocephalic and atraumatic.  Eyes: Conjunctivae and EOM are normal.  Neck: Neck supple. No tracheal deviation present.  Cardiovascular: Normal rate.   Pulmonary/Chest: Effort normal. No respiratory distress.  Abdominal: Soft. He exhibits no distension. There is no tenderness.  Musculoskeletal:  Right knee: No gross deformity noted. Knee with decreased ROM. TTP of lateral knee which is warm to the touch, slightly swollen. No abnormal alignment or patellar mobility.  No varus/valgus laxity. Negative drawer's, negative Lachman's, negative McMurray's, no crepitus. 2+ DP pulse's bilaterally. All compartments are soft. Sensation intact distal to injury.  Neurological: He is alert and oriented to person, place, and time.  Skin: Skin is warm and dry. He is not diaphoretic.  Psychiatric:  He has a normal mood and affect. His behavior is normal.  Nursing note and vitals reviewed.   ED Course  Procedures (including critical care time)  DIAGNOSTIC STUDIES: Oxygen Saturation is 100% on RA, normal by my interpretation.    COORDINATION OF CARE: 11:47 AM - XR results reviewed with pt. Discussed treatment plan with pt at bedside which includes Rx anti-inflammatory medications. Will give orthopedic referral for  pt to use if his symptoms do not improve in 5 days. Pt verbalized understanding and agreed to plan.   Imaging Review Dg Knee Complete 4 Views Right  05/03/2015  CLINICAL DATA:  Knee injury and pain yesterday wall walking. Inability to bear weight. Initial encounter. EXAM: RIGHT KNEE - COMPLETE 4+ VIEW COMPARISON:  None. FINDINGS: There is no evidence of fracture, dislocation, or joint effusion. Mild degenerative spurring of tibial spines noted. No evidence of joint space narrowing or other signs of arthropathy. Soft tissues are unremarkable. IMPRESSION: No acute findings. Early degenerative spurring of tibial spines. Electronically Signed   By: Myles Rosenthal M.D.   On: 05/03/2015 09:59   I have personally reviewed and evaluated these images and lab results as part of my medical decision-making.  MDM   Final diagnoses:  Acute knee pain, right  Elevated blood pressure   Alan Boyle presents with acute, atraumatic right knee pain. On exam, knee with slightly swollen and warm to the touch c/w gout. Not febrile, well appearing.   Imaging: No acute findings on knee x-ray.   A&P: Acute right knee pain c/w gout - Consulted pharmacy for treatment recommendations due to HIV medication. Recommends holding NSAIDS because of increased nephrotoxicity and will start colchicine instead. Given ortho follow up and strict return precautions.   Elevated blood pressure: BP has been elevated in chart several times. Patient will get [facility] on March 1st and was informed on how to find a PCP in the area. Stressed importance of following up on BP.   I personally performed the services described in this documentation, which was scribed in my presence. The recorded information has been reviewed and is accurate.    [facility] Draven Natter, PA-C 05/03/15 1501  Lorre Nick, MD 05/05/15 [phone]

## 2015-05-03 NOTE — Progress Notes (Addendum)
EDCM received phone call from patient reporting he is unable to afford prescription for cholchicine.  Patient listed as not having pcp or insurance.  Patient reports prescription is at [facility] on [address].  EDCM placed patient in [facility] program.  EDCM informed patient that [facility] letter will expire in seven days, this is a one time assist and he will not be eligible for this assistance until this date next year.  Patient is agreeable to copay.  EDCM  Trying to reach [facility] on [address] for fax number without success.  Will keep trying.  05/02/2014 A. Amonie Wisser RNCM 1730pm EDCM reached pharmacist at [facility] on [address] and provided fax number [phone].  [facility] faxed MATCH letter to [facility] with confirmation of receipt.  Patient made aware.  No further EDCM needs at this time.

## 2015-05-03 NOTE — Discharge Instructions (Signed)
1. Medications: Take colchicine as directed, continue usual home medications 2. Treatment: rest, drink plenty of fluids, ice and elevate your knee for additional pain relief 3. Follow Up: Please follow up with the orthopedic clinic listed if no improvement after 5 days for discussion of your diagnoses and further evaluation after today's visit; Please return to the ER for any new or worsening symptoms, any additional concerns.

## 2015-08-19 ENCOUNTER — Emergency Department (HOSPITAL_COMMUNITY)
Admission: EM | Admit: 2015-08-19 | Discharge: 2015-08-19 | Disposition: A | Discharge status: Home / Self Care | Payer: Self-pay | Attending: Emergency Medicine | Admitting: Emergency Medicine

## 2015-08-19 ENCOUNTER — Encounter (HOSPITAL_COMMUNITY): Payer: Self-pay | Admitting: Emergency Medicine

## 2015-08-19 DIAGNOSIS — I10 Essential (primary) hypertension: Secondary | ICD-10-CM | POA: Insufficient documentation

## 2015-08-19 DIAGNOSIS — Z23 Encounter for immunization: Secondary | ICD-10-CM | POA: Insufficient documentation

## 2015-08-19 DIAGNOSIS — F17210 Nicotine dependence, cigarettes, uncomplicated: Secondary | ICD-10-CM | POA: Insufficient documentation

## 2015-08-19 DIAGNOSIS — E119 Type 2 diabetes mellitus without complications: Secondary | ICD-10-CM | POA: Insufficient documentation

## 2015-08-19 DIAGNOSIS — Z21 Asymptomatic human immunodeficiency virus [HIV] infection status: Secondary | ICD-10-CM | POA: Insufficient documentation

## 2015-08-19 DIAGNOSIS — Y929 Unspecified place or not applicable: Secondary | ICD-10-CM | POA: Insufficient documentation

## 2015-08-19 DIAGNOSIS — S0501XA Injury of conjunctiva and corneal abrasion without foreign body, right eye, initial encounter: Secondary | ICD-10-CM | POA: Insufficient documentation

## 2015-08-19 DIAGNOSIS — Y939 Activity, unspecified: Secondary | ICD-10-CM | POA: Insufficient documentation

## 2015-08-19 DIAGNOSIS — Y999 Unspecified external cause status: Secondary | ICD-10-CM | POA: Insufficient documentation

## 2015-08-19 DIAGNOSIS — W57XXXA Bitten or stung by nonvenomous insect and other nonvenomous arthropods, initial encounter: Secondary | ICD-10-CM | POA: Insufficient documentation

## 2015-08-19 DIAGNOSIS — Z9119 Patient's noncompliance with other medical treatment and regimen: Secondary | ICD-10-CM | POA: Insufficient documentation

## 2015-08-19 MED ORDER — TETRACAINE HCL 0.5 % OP SOLN
2.0000 [drp] | Freq: Once | OPHTHALMIC | Status: AC
  Administered 2015-08-19: 2 [drp] via OPHTHALMIC
  Filled 2015-08-19: qty 2

## 2015-08-19 MED ORDER — TETANUS-DIPHTH-ACELL PERTUSSIS 5-2.5-18.5 LF-MCG/0.5 IM SUSP
0.5000 mL | Freq: Once | INTRAMUSCULAR | Status: AC
  Administered 2015-08-19: 0.5 mL via INTRAMUSCULAR
  Filled 2015-08-19: qty 0.5

## 2015-08-19 MED ORDER — ERYTHROMYCIN 5 MG/GM OP OINT: TOPICAL_OINTMENT | OPHTHALMIC | Status: DC

## 2015-08-19 MED ORDER — FLUORESCEIN SODIUM 1 MG OP STRP
1.0000 | ORAL_STRIP | Freq: Once | OPHTHALMIC | Status: AC
  Administered 2015-08-19: 1 via OPHTHALMIC
  Filled 2015-08-19: qty 1

## 2015-08-19 NOTE — ED Provider Notes (Signed)
CSN: [id number]     Arrival date & time 08/19/15  1019 History  By signing my name below, I, Iona BeardChristian Pulliam, attest that this documentation has been prepared under the direction and in the presence of Rodney Yera, PA-C.   Electronically Signed: Iona Beardhristian Pulliam, ED Scribe 08/19/2015 at 11:50 AM.    Chief Complaint  Patient presents with  . Eye Pain    The history is provided by the patient. No language interpreter was used.   HPI Comments: Alan Boyle is a 35 y.o. male with PMHx of HIV disease, DM, and HTN who presents to the Emergency Department complaining of sudden onset, right eye pain, ongoing for two days. Pt reports a bug flew into the eye and is unsure of if it ever came out. He reports a foreign body sensation at the medial aspect of the eye. He states he has been rubbing at his eyes with the palm of his hand to try and alleviate this sensation. Pt reports associated photophobia, eye watering and right eye redness. Denies eye pain, blurred vision, loss of vision or painful EOM. No other associated symptoms noted. Pt has used dry eyes OTC medication with no relief to symptoms. Pt denies rhinorrhea, congestion, cough, fever, chills, or any pertinent symptoms.   Pt noted to be hypertensive in triage. Admits to noncompliance with his home HTN and HIV medications. States he is having insurance issues. Chart review shows history of documented HTN and medication noncompliance. Denies headache, vision changes, dizziness, chest pain, SOB.   Past Medical History  Diagnosis Date  . HIV disease (HCC)   . Rectal mass   . Rectal pain   . Diabetes mellitus     NO MEDS (FINANCIAL ISSUE)  . Hypertension     NO MEDS (FINANCIAL ISSUE)   Past Surgical History  Procedure Laterality Date  . Tumor excision N/A 12/16/2012    Procedure: EXAM UNDER ANESTHESIA AND REMOVAL OF RECTAL MASS;  Surgeon: Mariella SaaBenjamin T Hoxworth, MD;  [facility];  Service: General;  Laterality: N/A;    No family history on file. Social History  Substance Use Topics  . Smoking status: Current Some Day Smoker    Types: Cigars  . Smokeless tobacco: Never Used     Comment: OCCASIONAL CIGAR  . Alcohol Use: No    Review of Systems  Constitutional: Negative for fever and chills.  HENT: Negative for congestion and rhinorrhea.   Eyes: Positive for photophobia, discharge and redness. Negative for visual disturbance.  All other systems reviewed and are negative.    Allergies  Review of patient's allergies indicates no known allergies.  Home Medications   Prior to Admission medications   Medication Sig Start Date End Date Taking? Authorizing Provider  colchicine 0.6 MG tablet Take two tabs for the first dose, then take one tab daily until pain resolves. 05/03/15   Chase PicketJaime Pilcher Ward, PA-C  efavirenz-emtricitabine-tenofovir (ATRIPLA) 600-200-300 MG tablet Take 1 tablet by mouth daily. 02/03/15   Gardiner Barefootobert W Comer, MD  erythromycin ophthalmic ointment Place a 1/2 inch ribbon of ointment into the lower eyelid. 08/19/15   Sharvil Hoey, PA-C   BP 177/115 mmHg  Pulse 86  Temp(Src) 98.5 F (36.9 C) (Oral)  Resp 16  Ht 6\' 2"  (1.88 m)  Wt 235 lb (106.595 kg)  BMI 30.16 kg/m2  SpO2 99% Physical Exam  Constitutional: He appears well-developed and well-nourished. No distress.  HENT:  Head: Normocephalic and atraumatic.  Right Ear: External ear normal.  Left Ear: External ear normal.  Eyes: EOM are normal. Pupils are equal, round, and reactive to light. Right eye exhibits discharge (watery). Left eye exhibits no discharge. Right conjunctiva is injected. No scleral icterus.  Slit lamp exam:      The right eye shows corneal abrasion and fluorescein uptake.    Right eye appears injected with watery discharge. EOM intact without pain. PERRL. No periorbital erythema or swelling. Small amount of fluorescein uptake over the medial aspect of the right cornea. No corneal ulcers.    Visual  Acuity  Right Eye Distance: 20/40 without Rx lens Left Eye Distance: 20/40 without Rx lens (does not have glasses with him) Bilateral Distance: 20/40 without Rx lens     Neck: Normal range of motion.  Cardiovascular: Normal rate.   Pulmonary/Chest: Effort normal.  Musculoskeletal: Normal range of motion.  Moves all extremities spontaneously  Neurological: He is alert. Coordination normal.  Skin: Skin is warm and dry.  Psychiatric: He has a normal mood and affect. His behavior is normal.  Nursing note and vitals reviewed.   ED Course  Procedures (including critical care time) DIAGNOSTIC STUDIES: Oxygen Saturation is 99% on RA, normal by my interpretation.    COORDINATION OF CARE: 11:04 AM Discussed treatment plan with pt at bedside and pt agreed to plan.  Labs Review Labs Reviewed - No data to display  Imaging Review No results found. I have personally reviewed and evaluated these images and lab results as part of my medical decision-making.   EKG Interpretation None      MDM   Final diagnoses:  Corneal abrasion, right, initial encounter  Essential hypertension   Pt with corneal abrasion on fluorescein study. No evidence of foreign body. No change in visual acuity. PERRL. EOM intact and non-painful. Exam non-concerning for orbital cellulitis, hyphema, corneal ulcers or HSV. Pt is not a contact lens wearer. Patient will be discharged home with erythromycin. Tdap given. Patient understands to follow up with ophthalmology in the next few days & to return to ER if new symptoms develop including change in vision, purulent drainage, or entrapment. Discussed importance of PCP follow up and medication compliance with pt especially given uncontrolled HTN and HIV. Pt is currently in process of finding a PCP and states understanding of the serious nature of his chronic diseases. Referral information for PCPs in the area given in discharge paperwork. Return precautions given in  discharge paperwork and discussed with pt at bedside. Pt stable for discharge  I personally performed the services described in this documentation, which was scribed in my presence. The recorded information has been reviewed and is accurate.    Alveta Heimlich, PA-C 08/19/15 1151  Jebediah Macrae, PA-C 08/19/15 1152  Donnetta Hutching, MD 08/20/15 1314

## 2015-08-19 NOTE — ED Notes (Signed)
Declined W/C at D/C and was escorted to lobby by RN. 

## 2015-08-19 NOTE — ED Notes (Signed)
C/o right eye pain, redness, watering, photosensitivity. States 'a bug flew into it' 2 days ago. Started yesterday with redness and pain.

## 2015-08-19 NOTE — ED Notes (Signed)
PT HAS HX OF HTN AND HIV. STATES HAS NOT TAKEN MEDS FOR EITHER IN 3 MONTHS.

## 2015-08-19 NOTE — Discharge Instructions (Signed)
Corneal Abrasion The cornea is the clear covering at the front and center of the eye. When looking at the colored portion of the eye (iris), you are looking through the cornea. This very thin tissue is made up of many layers. The surface layer is a single layer of cells (corneal epithelium) and is one of the most sensitive tissues in the body. If a scratch or injury causes the corneal epithelium to come off, it is called a corneal abrasion. If the injury extends to the tissues below the epithelium, the condition is called a corneal ulcer. CAUSES   Scratches.  Trauma.  Foreign body in the eye. Some people have recurrences of abrasions in the area of the original injury even after it has healed (recurrent erosion syndrome). Recurrent erosion syndrome generally improves and goes away with time. SYMPTOMS   Eye pain.  Difficulty or inability to keep the injured eye open.  The eye becomes very sensitive to light.  Recurrent erosions tend to happen suddenly, first thing in the morning, usually after waking up and opening the eye. DIAGNOSIS  Your health care provider can diagnose a corneal abrasion during an eye exam. Dye is usually placed in the eye using a drop or a small paper strip moistened by your tears. When the eye is examined with a special light, the abrasion shows up clearly because of the dye. TREATMENT   Small abrasions may be treated with antibiotic drops or ointment alone.  A pressure patch may be put over the eye. If this is done, follow your doctor's instructions for when to remove the patch. Do not drive or use machines while the eye patch is on. Judging distances is hard to do with a patch on. If the abrasion becomes infected and spreads to the deeper tissues of the cornea, a corneal ulcer can result. This is serious because it can cause corneal scarring. Corneal scars interfere with light passing through the cornea and cause a loss of vision in the involved eye. HOME CARE  INSTRUCTIONS  Use medicine or ointment as directed. Only take over-the-counter or prescription medicines for pain, discomfort, or fever as directed by your health care provider.  Do not drive or operate machinery if your eye is patched. Your ability to judge distances is impaired.  If your health care provider has given you a follow-up appointment, it is very important to keep that appointment. Not keeping the appointment could result in a severe eye infection or permanent loss of vision. If there is any problem keeping the appointment, let your health care provider know. SEEK MEDICAL CARE IF:   You have pain, light sensitivity, and a scratchy feeling in one eye or both eyes.  Your pressure patch keeps loosening up, and you can blink your eye under the patch after treatment.  Any kind of discharge develops from the eye after treatment or if the lids stick together in the morning.  You have the same symptoms in the morning as you did with the original abrasion days, weeks, or months after the abrasion healed.   This information is not intended to replace advice given to you by your health care provider. Make sure you discuss any questions you have with your health care provider.   Document Released: 02/25/2000 Document Revised: 11/18/2014 Document Reviewed: 11/04/2012 Elsevier Interactive Patient Education 2016 [employer]  Hypertension Hypertension, commonly called high blood pressure, is when the force of blood pumping through your arteries is too strong. Your arteries are the blood  vessels that carry blood from your heart throughout your body. A blood pressure reading consists of a higher number over a lower number, such as 110/72. The higher number (systolic) is the pressure inside your arteries when your heart pumps. The lower number (diastolic) is the pressure inside your arteries when your heart relaxes. Ideally you want your blood pressure below 120/80. Hypertension forces your  heart to work harder to pump blood. Your arteries may become narrow or stiff. Having untreated or uncontrolled hypertension can cause heart attack, stroke, kidney disease, and other problems. RISK FACTORS Some risk factors for high blood pressure are controllable. Others are not.  Risk factors you cannot control include:   Race. You may be at higher risk if you are African American.  Age. Risk increases with age.  Gender. Men are at higher risk than women before age 39 years. After age 74, women are at higher risk than men. Risk factors you can control include:  Not getting enough exercise or physical activity.  Being overweight.  Getting too much fat, sugar, calories, or salt in your diet.  Drinking too much alcohol. SIGNS AND SYMPTOMS Hypertension does not usually cause signs or symptoms. Extremely high blood pressure (hypertensive crisis) may cause headache, anxiety, shortness of breath, and nosebleed. DIAGNOSIS To check if you have hypertension, your health care provider will measure your blood pressure while you are seated, with your arm held at the level of your heart. It should be measured at least twice using the same arm. Certain conditions can cause a difference in blood pressure between your right and left arms. A blood pressure reading that is higher than normal on one occasion does not mean that you need treatment. If it is not clear whether you have high blood pressure, you may be asked to return on a different day to have your blood pressure checked again. Or, you may be asked to monitor your blood pressure at home for 1 or more weeks. TREATMENT Treating high blood pressure includes making lifestyle changes and possibly taking medicine. Living a healthy lifestyle can help lower high blood pressure. You may need to change some of your habits. Lifestyle changes may include:  Following the DASH diet. This diet is high in fruits, vegetables, and whole grains. It is low in salt,  red meat, and added sugars.  Keep your sodium intake below 2,300 mg per day.  Getting at least 30-45 minutes of aerobic exercise at least 4 times per week.  Losing weight if necessary.  Not smoking.  Limiting alcoholic beverages.  Learning ways to reduce stress. Your health care provider may prescribe medicine if lifestyle changes are not enough to get your blood pressure under control, and if one of the following is true:  You are 54-64 years of age and your systolic blood pressure is above 140.  You are 30 years of age or older, and your systolic blood pressure is above 150.  Your diastolic blood pressure is above 90.  You have diabetes, and your systolic blood pressure is over 140 or your diastolic blood pressure is over 90.  You have kidney disease and your blood pressure is above 140/90.  You have heart disease and your blood pressure is above 140/90. Your personal target blood pressure may vary depending on your medical conditions, your age, and other factors. HOME CARE INSTRUCTIONS  Have your blood pressure rechecked as directed by your health care provider.   Take medicines only as directed by your health care  provider. Follow the directions carefully. Blood pressure medicines must be taken as prescribed. The medicine does not work as well when you skip doses. Skipping doses also puts you at risk for problems.  Do not smoke.   Monitor your blood pressure at home as directed by your health care provider. SEEK MEDICAL CARE IF:   You think you are having a reaction to medicines taken.  You have recurrent headaches or feel dizzy.  You have swelling in your ankles.  You have trouble with your vision. SEEK IMMEDIATE MEDICAL CARE IF:  You develop a severe headache or confusion.  You have unusual weakness, numbness, or feel faint.  You have severe chest or abdominal pain.  You vomit repeatedly.  You have trouble breathing. MAKE SURE YOU:   Understand these  instructions.  Will watch your condition.  Will get help right away if you are not doing well or get worse.   This information is not intended to replace advice given to you by your health care provider. Make sure you discuss any questions you have with your health care provider.   Document Released: 02/27/2005 Document Revised: 07/14/2014 Document Reviewed: 12/20/2012 Elsevier Interactive Patient Education [employer]

## 2017-05-19 ENCOUNTER — Other Ambulatory Visit: Payer: Self-pay

## 2017-05-19 ENCOUNTER — Emergency Department (HOSPITAL_COMMUNITY)
Admission: EM | Admit: 2017-05-19 | Discharge: 2017-05-19 | Disposition: A | Discharge status: Home / Self Care | Payer: Self-pay | Attending: Emergency Medicine | Admitting: Emergency Medicine

## 2017-05-19 ENCOUNTER — Encounter (HOSPITAL_COMMUNITY): Payer: Self-pay | Admitting: *Deleted

## 2017-05-19 ENCOUNTER — Emergency Department (HOSPITAL_COMMUNITY): Payer: Self-pay

## 2017-05-19 DIAGNOSIS — B20 Human immunodeficiency virus [HIV] disease: Secondary | ICD-10-CM | POA: Insufficient documentation

## 2017-05-19 DIAGNOSIS — F17290 Nicotine dependence, other tobacco product, uncomplicated: Secondary | ICD-10-CM | POA: Insufficient documentation

## 2017-05-19 DIAGNOSIS — Z79899 Other long term (current) drug therapy: Secondary | ICD-10-CM | POA: Insufficient documentation

## 2017-05-19 DIAGNOSIS — R0981 Nasal congestion: Secondary | ICD-10-CM | POA: Insufficient documentation

## 2017-05-19 DIAGNOSIS — E119 Type 2 diabetes mellitus without complications: Secondary | ICD-10-CM | POA: Insufficient documentation

## 2017-05-19 DIAGNOSIS — I10 Essential (primary) hypertension: Secondary | ICD-10-CM | POA: Insufficient documentation

## 2017-05-19 DIAGNOSIS — R05 Cough: Secondary | ICD-10-CM | POA: Insufficient documentation

## 2017-05-19 DIAGNOSIS — J029 Acute pharyngitis, unspecified: Secondary | ICD-10-CM | POA: Insufficient documentation

## 2017-05-19 LAB — I-STAT CHEM 8, ED
BUN: 7 mg/dL (ref 6–20)
CREATININE: 0.9 mg/dL (ref 0.61–1.24)
Calcium, Ion: 1.15 mmol/L (ref 1.15–1.40)
Chloride: 99 mmol/L — ABNORMAL LOW (ref 101–111)
GLUCOSE: 166 mg/dL — AB (ref 65–99)
HCT: 42 % (ref 39.0–52.0)
Hemoglobin: 14.3 g/dL (ref 13.0–17.0)
POTASSIUM: 3.8 mmol/L (ref 3.5–5.1)
Sodium: 141 mmol/L (ref 135–145)
TCO2: 28 mmol/L (ref 22–32)

## 2017-05-19 LAB — RAPID STREP SCREEN (MED CTR MEBANE ONLY): Streptococcus, Group A Screen (Direct): NEGATIVE

## 2017-05-19 MED ORDER — ACYCLOVIR 400 MG PO TABS: 400.0000 mg | ORAL_TABLET | Freq: Three times a day (TID) | ORAL | 0 refills | Status: AC

## 2017-05-19 MED ORDER — MAGIC MOUTHWASH
5.0000 mL | Freq: Once | ORAL | Status: AC
  Administered 2017-05-19: 5 mL via ORAL
  Filled 2017-05-19: qty 5

## 2017-05-19 MED ORDER — MAGIC MOUTHWASH W/LIDOCAINE: 5.0000 mL | Freq: Three times a day (TID) | ORAL | 0 refills | Status: DC | PRN

## 2017-05-19 MED ORDER — LISINOPRIL 10 MG PO TABS: 10.0000 mg | ORAL_TABLET | Freq: Every day | ORAL | 0 refills | Status: DC

## 2017-05-19 NOTE — Progress Notes (Signed)
CSW met with pt, provided information about the [facility] and establishing a Primary Care Physician.   CSW obtained verbal permission from pt for the ED CM to follow up with pt about medication needs and if there is assistance available. CSW explained CM role to pt. CSW will leave handoff for ED CM to follow up with pt when available. Pt's phone number is [phone].   No further social work needs at this time. CSW signing off.  Wendelyn Breslow, [facility]  [phone]

## 2017-05-19 NOTE — ED Notes (Signed)
Pt transported to xray 

## 2017-05-19 NOTE — Discharge Instructions (Signed)
As discussed, take your entire course of antibiotics even if you improve.  Make sure to follow-up with the case manager for primary care and to get back on track with medication regiment.  Gargle with mouthwash to help with symptoms, hot tea with honey, cough drops or over-the-counter throat sprays may be helpful.  Plenty of fluids to maintain hydration.  Follow-up with your primary care provider/infectious disease specialist.  Return sooner if symptoms worsen, difficulty swallowing, breathing, drooling, swelling, fever, chills or other new concerning symptoms in the meantime.

## 2017-05-19 NOTE — ED Notes (Signed)
Provider collected HSV specimen.

## 2017-05-19 NOTE — ED Triage Notes (Signed)
Pt in c/o sore throat, cough and congestion x4 days, denies fever, no distress noted

## 2017-05-19 NOTE — ED Provider Notes (Signed)
[facility] EMERGENCY DEPARTMENT Provider Note   CSN: [id number] Arrival date & time: 05/19/17  2130     History   Chief Complaint Chief Complaint  Patient presents with  . Sore Throat  . URI    HPI Alan Boyle is a 37 y.o. male with past medical history significant for diabetes, HIV, hypertension presenting with 4 days of sore throat, congestion and cough.  She has not has known ill contacts with niece and nephews with similar symptoms in the past 2 weeks.  He has tried Mucinex with some relief.  Reports that his symptoms have worsened over the last 4 days and has mild changes in his voice.  He denies any difficulty breathing, drooling.  He has been able to eat and drink with odynophagia. He denies any fever, endorses chills and sweats.  Denies any nausea, vomiting, diarrhea or abdominal pain. Patient reports that the last time he was followed by infectious disease was about a year ago where he had a CD4 count and has not taken any antivirals the past year.  He has missed his appointment and this had difficulties obtaining his medications.  Has not been compliant with antihypertensives or metformin.    HPI  Past Medical History:  Diagnosis Date  . Diabetes mellitus    NO MEDS (FINANCIAL ISSUE)  . HIV disease (HCC)   . Hypertension    NO MEDS (FINANCIAL ISSUE)  . Rectal mass   . Rectal pain     Patient Active Problem List   Diagnosis Date Noted  . Tobacco use disorder 09/26/2013  . Obese 08/15/2011  . SYPHILIS 03/29/2010  . HIV DISEASE 05/06/2009  . DIABETES MELLITUS, TYPE II 05/06/2009  . DEPRESSION 05/06/2009  . HYPERTENSION 05/06/2009    Past Surgical History:  Procedure Laterality Date  . TUMOR EXCISION N/A 12/16/2012   Procedure: EXAM UNDER ANESTHESIA AND REMOVAL OF RECTAL MASS;  Surgeon: Mariella Saa, MD;  Location: [facility];  Service: General;  Laterality: N/A;       Home Medications    Prior to Admission  medications   Medication Sig Start Date End Date Taking? Authorizing Provider  acyclovir (ZOVIRAX) 400 MG tablet Take 1 tablet (400 mg total) by mouth 3 (three) times daily for 10 days. 05/19/17 05/29/17  Mathews Robinsons B, PA-C  colchicine 0.6 MG tablet Take two tabs for the first dose, then take one tab daily until pain resolves. 05/03/15   Ward, Chase Picket, PA-C  efavirenz-emtricitabine-tenofovir (ATRIPLA) 600-200-300 MG tablet Take 1 tablet by mouth daily. 02/03/15   ComerBelia Heman, MD  erythromycin ophthalmic ointment Place a 1/2 inch ribbon of ointment into the lower eyelid. 08/19/15   Barrett, Rolm Gala, PA-C  lisinopril (PRINIVIL,ZESTRIL) 10 MG tablet Take 1 tablet (10 mg total) by mouth daily. 05/19/17   Mathews Robinsons B, PA-C  magic mouthwash w/lidocaine SOLN Take 5 mLs by mouth 3 (three) times daily as needed for mouth pain. 05/19/17   Georgiana Shore PA-C    Family History History reviewed. No pertinent family history.  Social History Social History   Tobacco Use  . Smoking status: Current Some Day Smoker    Types: Cigars  . Smokeless tobacco: Never Used  . Tobacco comment: OCCASIONAL CIGAR  Substance Use Topics  . Alcohol use: No  . Drug use: No     Allergies   Patient has no known allergies.   Review of Systems Review of Systems  Constitutional: Positive for  chills and diaphoresis. Negative for fever.  HENT: Positive for congestion, rhinorrhea, sore throat and voice change. Negative for ear pain and trouble swallowing.   Eyes: Negative for photophobia, pain, redness and visual disturbance.  Respiratory: Positive for cough. Negative for choking, chest tightness, shortness of breath, wheezing and stridor.   Cardiovascular: Negative for chest pain, palpitations and leg swelling.  Gastrointestinal: Negative for abdominal distention, abdominal pain, diarrhea, nausea and vomiting.  Genitourinary: Negative for difficulty urinating, dysuria, flank pain, frequency and  hematuria.  Musculoskeletal: Negative for arthralgias, back pain, gait problem, joint swelling, myalgias, neck pain and neck stiffness.  Skin: Negative for color change, pallor and rash.  Neurological: Negative for dizziness, seizures, syncope, light-headedness and headaches.     Physical Exam Updated Vital Signs BP (!) 169/114 (BP Location: Left Arm)   Pulse 85   Temp 98.7 F (37.1 C) (Oral)   Resp 20   SpO2 99%   Physical Exam  Constitutional: He appears well-developed and well-nourished.  Non-toxic appearance. He does not appear ill. No distress.  Patient with slightly muffled voice, afebrile nontoxic appearing sitting comfortably in bed no acute distress.  He is tolerating his oral secretions and speaking in full sentences.  HENT:  Head: Normocephalic and atraumatic.  Right Ear: Tympanic membrane and ear canal normal. No drainage.  Left Ear: Tympanic membrane and ear canal normal. No drainage.  Mouth/Throat: Uvula is midline and mucous membranes are normal. No uvula swelling. No oropharyngeal exudate or tonsillar abscesses. Tonsils are 2+ on the right. Tonsils are 2+ on the left. No tonsillar exudate.  Oropharynx with multiple circular lesions with central clearing on an erythematous base. No gross oral abscess. Uvula is midline, arches are simmetrical and intact. No peritonsilar swelling or exudate. No trismus. Sublingual mucosa is soft and non-tender. Tolerating oral secretions. No concern for ludwig's angina.   Eyes: Conjunctivae and EOM are normal.  Neck: Normal range of motion. Neck supple.  Cardiovascular: Normal rate, regular rhythm and normal heart sounds.  No murmur heard. Pulmonary/Chest: Effort normal and breath sounds normal. No stridor. No respiratory distress. He has no wheezes. He has no rhonchi. He has no rales.  Abdominal: He exhibits no distension.  Musculoskeletal: He exhibits no edema.  Neurological: He is alert.  Skin: Skin is warm and dry. No rash noted.  He is not diaphoretic. No erythema. No pallor.  Psychiatric: He has a normal mood and affect.  Nursing note and vitals reviewed.    ED Treatments / Results  Labs (all labs ordered are listed, but only abnormal results are displayed) Labs Reviewed  I-STAT CHEM 8, ED - Abnormal; Notable for the following components:      Result Value   Chloride 99 (*)    Glucose, Bld 166 (*)    All other components within normal limits  RAPID STREP SCREEN (NOT AT [facility])  CULTURE, GROUP A STREP [facility])  CD4/CD8 (T-HELPER/T-SUPPRESSOR CELL)  HERPES SIMPLEX VIRUS(HSV) DNA BY PCR    EKG  EKG Interpretation None       Radiology Dg Chest 2 View  Result Date: 05/19/2017 CLINICAL DATA:  Cough, sore throat, chest congestion EXAM: CHEST - 2 VIEW COMPARISON:  None. FINDINGS: Heart and mediastinal contours are within normal limits. No focal opacities or effusions. No acute bony abnormality. IMPRESSION: No active cardiopulmonary disease. Electronically Signed   By: Charlett NoseKevin  Dover M.D.   On: 05/19/2017 09:59    Procedures Procedures (including critical care time)  Medications Ordered in ED Medications  magic mouthwash (5 mLs Oral Given 05/19/17 1022)     Initial Impression / Assessment and Plan / ED Course  I have reviewed the triage vital signs and the nursing notes.  Pertinent labs & imaging results that were available during my care of the patient were reviewed by me and considered in my medical decision making (see chart for details).    Pt presents afebrile without tonsillar exudate, negative strep. mild cervical lymphadenopathy, & odynophagia. Cough and URI symptoms.  Chest xray negative.  Pt does not appear dehydrated, but did discuss importance of water rehydration. Presentation non concerning for PTA or infxn spread to soft tissue. No trismus or uvula deviation. Specific return precautions discussed. Pt able to drink water in ED without difficulty with intact airway.   Patient with poor  follow up and noncompliance with HIV antiviral regimen. CD4 count obtained and consulted case management.  Patient tolerating oral fluids.  Will discharge home with 30-day course of lisinopril, acyclovir and Magic mouthwash. Case management spoke to patient and will assist in PCP follow up and medications.  Patient afebrile, nontoxic and ready to go home.  Discussed strict return precautions and advised to return to the emergency department if experiencing any new or worsening symptoms. Instructions were understood and patient agreed with discharge plan.  Final Clinical Impressions(s) / ED Diagnoses   Final diagnoses:  Acute pharyngitis, unspecified etiology    ED Discharge Orders        Ordered    acyclovir (ZOVIRAX) 400 MG tablet  3 times daily     05/19/17 1207    magic mouthwash w/lidocaine SOLN  3 times daily PRN     05/19/17 1207    lisinopril (PRINIVIL,ZESTRIL) 10 MG tablet  Daily     05/19/17 1207       Georgiana Shore, PA-C 05/19/17 1218    Arby Barrette, MD 05/21/17 [phone]

## 2017-05-19 NOTE — Care Management Note (Addendum)
Case Management Note  Patient Details  Name: Alan Boyle MRN: [id number] Date of Birth: [birth year]  Subjective/Objective:           Pt presented to ED for sore throat.    Pt has not followed up with ID clinic for HIV meds /labs in approximately 1 year.  Pt has been on no medications and is hypertensive in the ED.  Pt reports he did not follow-up as directed due to scheduling challenges in getting his labs drawn and they would not provide medications until labs were drawn.   Action/Plan: Pt given information on [facility] and ID clinic.  Pt states he is aware that there is no other source for HIV medication due to cost of medication other than ID clinic and it is a matter of life and death that he get his medication and follow up as directed.     D/W PA and offered MATCH assistance for prescriptions written today.  Notified PA that we could not offer assistance with HIV medication ($2700), but if the patient needs other prescriptions today, MATCH could be used. PA states no other scripts will be written today.  RN aware.    Expected Discharge Date:                  Expected Discharge Plan:  Home/Self Care  In-House Referral:  Clinical Social Work, PCP / Research officer, [facility]  Discharge planning Services  CM Consult  Post Acute Care Choice:  NA Choice offered to:  NA  DME Arranged:    DME Agency:     HH Arranged:    HH Agency:     Status of Service:     If discussed at [employer] Meetings, dates discussed:    Additional Comments: Update 05/18/17 1241: RN called back to say that pt is being given scripts for Acyclovir, Magic Mouthwash and Lisinopril.  [facility]  RN gave pt letter and list of pharmacies.  Pt aware that scripts $3 each.    Deveron FurlongAshley  Tiari Andringa, RN 05/19/2017, 11:25 AM

## 2017-05-20 ENCOUNTER — Telehealth: Payer: Self-pay | Admitting: Surgery

## 2017-05-20 NOTE — Telephone Encounter (Signed)
ED CM received CM consult concerning medication assistance and [facility]. CM contacted patient by phone regarding consent to forward information to  Tomasita Morrowammy King RN [facility] patient provided permission. Referral sent via in-basket. Patient also instructed to contact [facility] if he has not received a call within 3 business day. Patient verbalized understanding and teach back done. No further ED CM concerns or questions verbalized.

## 2017-05-21 LAB — CD4/CD8 (T-HELPER/T-SUPPRESSOR CELL)
CD4 absolute: 540 /uL (ref 500–1900)
CD4%: 25 % — ABNORMAL LOW (ref 30.0–60.0)
CD8 T Cell Abs: 1023 /uL — ABNORMAL HIGH (ref 230–1000)
CD8tox: 47 % — ABNORMAL HIGH (ref 15.0–40.0)
RATIO: 0.52 — AB (ref 1.0–3.0)
Total lymphocyte count: 2160 /uL (ref 1000–4000)

## 2017-05-22 LAB — CULTURE, GROUP A STREP (THRC)

## 2017-05-22 LAB — HERPES SIMPLEX VIRUS(HSV) DNA BY PCR
HSV 1 DNA: NEGATIVE
HSV 2 DNA: NEGATIVE

## 2017-06-13 NOTE — Telephone Encounter (Signed)
-----   Message from Michel BickersWandalyn Rogers, RN sent at 05/20/2017 12:34 PM EDT ----- Regarding: [facility] follow up needed  Hi Tammie!  I hope all is well. I am emailing you about this patient who was seen int the ED this weekend for to be restarted on HIV medications. I spoke with and provided the information on the process. I am forwarding you guys his information and he has given consent to be contacted by phone. He may need the assistance to get started, Thanks Tammy.    Sherwood GamblerWandalyn   Michel BickersWandalyn Rogers RN BSN CNOR ED Case Manager Case Management Department [facility] [address]                     [place] [id number] Ph. [phone] Ph [phone] Fax: [phone] [email or web address]

## 2017-06-13 NOTE — Telephone Encounter (Signed)
Will contact patient for new appointment.   Laurell Josephsammy K King, RN

## 2017-06-21 ENCOUNTER — Other Ambulatory Visit: Payer: Self-pay

## 2017-06-21 ENCOUNTER — Other Ambulatory Visit: Payer: Self-pay | Admitting: *Deleted

## 2017-06-21 ENCOUNTER — Ambulatory Visit: Payer: Self-pay

## 2017-06-21 DIAGNOSIS — Z113 Encounter for screening for infections with a predominantly sexual mode of transmission: Secondary | ICD-10-CM

## 2017-06-21 DIAGNOSIS — Z79899 Other long term (current) drug therapy: Secondary | ICD-10-CM

## 2017-06-21 DIAGNOSIS — B20 Human immunodeficiency virus [HIV] disease: Secondary | ICD-10-CM

## 2017-07-05 ENCOUNTER — Ambulatory Visit: Payer: Self-pay

## 2017-07-05 ENCOUNTER — Encounter: Payer: Self-pay | Admitting: Family

## 2017-08-13 ENCOUNTER — Other Ambulatory Visit: Payer: Self-pay

## 2017-08-13 ENCOUNTER — Ambulatory Visit: Payer: Self-pay

## 2017-08-29 ENCOUNTER — Encounter: Payer: Self-pay | Admitting: Internal Medicine

## 2017-08-29 ENCOUNTER — Ambulatory Visit: Payer: Self-pay

## 2017-09-18 ENCOUNTER — Emergency Department (HOSPITAL_COMMUNITY)
Admission: EM | Admit: 2017-09-18 | Discharge: 2017-09-18 | Disposition: A | Discharge status: Home / Self Care | Payer: Self-pay | Attending: Emergency Medicine | Admitting: Emergency Medicine

## 2017-09-18 ENCOUNTER — Encounter (HOSPITAL_COMMUNITY): Payer: Self-pay | Admitting: Emergency Medicine

## 2017-09-18 ENCOUNTER — Other Ambulatory Visit: Payer: Self-pay

## 2017-09-18 DIAGNOSIS — F17290 Nicotine dependence, other tobacco product, uncomplicated: Secondary | ICD-10-CM | POA: Insufficient documentation

## 2017-09-18 DIAGNOSIS — K0889 Other specified disorders of teeth and supporting structures: Secondary | ICD-10-CM | POA: Insufficient documentation

## 2017-09-18 DIAGNOSIS — I10 Essential (primary) hypertension: Secondary | ICD-10-CM | POA: Insufficient documentation

## 2017-09-18 DIAGNOSIS — E119 Type 2 diabetes mellitus without complications: Secondary | ICD-10-CM | POA: Insufficient documentation

## 2017-09-18 DIAGNOSIS — Z79899 Other long term (current) drug therapy: Secondary | ICD-10-CM | POA: Insufficient documentation

## 2017-09-18 DIAGNOSIS — B20 Human immunodeficiency virus [HIV] disease: Secondary | ICD-10-CM | POA: Insufficient documentation

## 2017-09-18 DIAGNOSIS — R0981 Nasal congestion: Secondary | ICD-10-CM | POA: Insufficient documentation

## 2017-09-18 MED ORDER — PENICILLIN V POTASSIUM 500 MG PO TABS: 500.0000 mg | ORAL_TABLET | Freq: Four times a day (QID) | ORAL | 0 refills | Status: AC

## 2017-09-18 MED ORDER — NAPROXEN 375 MG PO TABS: 375.0000 mg | ORAL_TABLET | Freq: Two times a day (BID) | ORAL | 0 refills | Status: DC

## 2017-09-18 NOTE — ED Triage Notes (Addendum)
Nasal congestion and dental pain left side  With swelling, pt is HIV positive and is off meds x 1 year, had appointment for infectious disease  But he overslept and missed it

## 2017-09-18 NOTE — ED Provider Notes (Signed)
[facility] EMERGENCY DEPARTMENT Provider Note   CSN: [id number] Arrival date & time: 09/18/17  1216     History   Chief Complaint Chief Complaint  Patient presents with  . Dental Pain  . Nasal Congestion    HPI Alan Boyle is a 37 y.o. male with a hx of HIV, HTN, and DM who presents to the ED with complaints of dental pain x 2 days. Patient mentions for the past month he had been having some URI sxs- congestion, rhinorrhea, sore throat, with some coughing. He states that the URI sxs have been improving recently however yesterday he developed left lower dental pain, he states he his here for the dental pain not URI sxs. Rates pain a 9/10 in severity, worse with chewing. He has some associated L sided facial swelling. States he has tried tylenol which was initially helping, but is no longer helping. He was suppose to see his infectious disease doctor recently but missed the appointment, he has been off his meds for about a year. Denies fever, chills, dyspnea, or chest pain.   HPI  Past Medical History:  Diagnosis Date  . Diabetes mellitus    NO MEDS (FINANCIAL ISSUE)  . HIV disease (HCC)   . Hypertension    NO MEDS (FINANCIAL ISSUE)  . Rectal mass   . Rectal pain     Patient Active Problem List   Diagnosis Date Noted  . Tobacco use disorder 09/26/2013  . Obese 08/15/2011  . SYPHILIS 03/29/2010  . HIV DISEASE 05/06/2009  . DIABETES MELLITUS, TYPE II 05/06/2009  . DEPRESSION 05/06/2009  . HYPERTENSION 05/06/2009    Past Surgical History:  Procedure Laterality Date  . TUMOR EXCISION N/A 12/16/2012   Procedure: EXAM UNDER ANESTHESIA AND REMOVAL OF RECTAL MASS;  Surgeon: Mariella Saa, MD;  Location: [facility];  Service: General;  Laterality: N/A;        Home Medications    Prior to Admission medications   Medication Sig Start Date End Date Taking? Authorizing Provider  colchicine 0.6 MG tablet Take two tabs for the first  dose, then take one tab daily until pain resolves. 05/03/15   Ward, Chase Picket, PA-C  efavirenz-emtricitabine-tenofovir (ATRIPLA) 600-200-300 MG tablet Take 1 tablet by mouth daily. 02/03/15   ComerBelia Heman, MD  erythromycin ophthalmic ointment Place a 1/2 inch ribbon of ointment into the lower eyelid. 08/19/15   Barrett, Rolm Gala, PA-C  lisinopril (PRINIVIL,ZESTRIL) 10 MG tablet Take 1 tablet (10 mg total) by mouth daily. 05/19/17   Mathews Robinsons B, PA-C  magic mouthwash w/lidocaine SOLN Take 5 mLs by mouth 3 (three) times daily as needed for mouth pain. 05/19/17   Georgiana Shore, PA-C    Family History No family history on file.  Social History Social History   Tobacco Use  . Smoking status: Current Some Day Smoker    Types: Cigars  . Smokeless tobacco: Never Used  . Tobacco comment: OCCASIONAL CIGAR  Substance Use Topics  . Alcohol use: No  . Drug use: No     Allergies   Patient has no known allergies.   Review of Systems Review of Systems  Constitutional: Negative for chills and fever.  HENT: Positive for congestion (improving), dental problem, facial swelling, rhinorrhea and sore throat. Negative for drooling, ear pain, trouble swallowing and voice change.   Respiratory: Positive for cough (resolved). Negative for shortness of breath.   Cardiovascular: Negative for chest pain.  Gastrointestinal: Negative for  abdominal pain, nausea and vomiting.   Physical Exam Updated Vital Signs BP (!) 166/106 (BP Location: Right Arm)   Pulse 100   Temp 98.2 F (36.8 C) (Oral)   SpO2 100%   Physical Exam  Constitutional: He appears well-developed and well-nourished.  Non-toxic appearance. No distress.  HENT:  Head: Normocephalic and atraumatic.  Right Ear: Tympanic membrane and ear canal normal. Tympanic membrane is not perforated, not erythematous, not retracted and not bulging.  Left Ear: Tympanic membrane and ear canal normal. Tympanic membrane is not perforated, not  erythematous, not retracted and not bulging.  Nose: Mucosal edema (congestion) present. Right sinus exhibits no maxillary sinus tenderness and no frontal sinus tenderness. Left sinus exhibits no maxillary sinus tenderness and no frontal sinus tenderness.  Mouth/Throat: Uvula is midline. Posterior oropharyngeal erythema (mild) present. No oropharyngeal exudate.    Patient is tolerating his own secretions without difficulty, no trismus, no drooling, no hot potato voice. Submandibular compartment is soft. L sided facial swelling noted adjacently, mild, no palpable fluctuance, no overlying erythema/warmth.   Eyes: Conjunctivae are normal. Right eye exhibits no discharge. Left eye exhibits no discharge.  Neck: Normal range of motion. Neck supple.  Cardiovascular: Normal rate and regular rhythm.  No murmur heard. Pulmonary/Chest: Effort normal and breath sounds normal. No stridor. No respiratory distress. He has no wheezes. He has no rhonchi. He has no rales.  Lymphadenopathy:    He has no cervical adenopathy.  Neurological: He is alert.  Clear speech.   Skin: Skin is warm and dry. No rash noted.  Psychiatric: He has a normal mood and affect. His behavior is normal. Thought content normal.  Nursing note and vitals reviewed.    ED Treatments / Results  Labs (all labs ordered are listed, but only abnormal results are displayed) Labs Reviewed - No data to display  EKG None  Radiology No results found.  Procedures Procedures (including critical care time)  Medications Ordered in ED Medications - No data to display   Initial Impression / Assessment and Plan / ED Course  I have reviewed the triage vital signs and the nursing notes.  Pertinent labs & imaging results that were available during my care of the patient were reviewed by me and considered in my medical decision making (see chart for details).    Patient presents with dental pain x 2 days and URI sxs x 1 month that are  improving and almost resolved. Patient is nontoxic appearing.  Hx of HIV off meds, last absolute CD4 of 540 four months ago. Regarding URI sxs- these are improving, lungs CTA low suspicion for pneumonia. Ear exam without evidence of AOM or EOM. No meningeal signs. Regarding dental pain- No gross abscess.  Exam unconcerning for Ludwig's angina or spread of infection such as RPA/PTA.  Discussed findings and plan of care with supervising physician Dr. Rubin Payor who personally evaluated this patient, does not recommend imaging, recommends outpatient antibiotics such as Penicillin. Will treat with Penicillin VK and Naproxen, Penicillin will cover strep as well, however low suspicion for this.  Urged patient to follow-up with dentist, dental resources were provided.  Discussed treatment plan and need for follow up as well as return precautions. Provided opportunity for questions, patient confirmed understanding and is agreeable to plan.  Findings and plan of care discussed with supervising physician Dr. Rubin Payor who personally evaluated and examined this patient and is in agreement.   Final Clinical Impressions(s) / ED Diagnoses   Final diagnoses:  Pain, dental  ED Discharge Orders        Ordered    penicillin v potassium (VEETID) 500 MG tablet  4 times daily     09/18/17 1427    naproxen (NAPROSYN) 375 MG tablet  2 times daily     09/18/17 [address]1427       Deen Deguia, Pleas KochSamantha R, PA-C 09/18/17 1710    Benjiman CorePickering, Nathan, MD 09/19/17 [phone]

## 2017-09-18 NOTE — ED Provider Notes (Signed)
Patient placed in Quick Look pathway, seen and evaluated   Chief Complaint: dental pain, cold sxs  HPI:   L lower molar pain x 2 days, cold sxs several weeks  ROS: no fever (one)  Physical Exam:   Gen: No distress  Neuro: Awake and Alert  Skin: Warm    Focused Exam: tenderness to tooth 17-18 with adjacent facial swelling   Initiation of care has begun. The patient has been counseled on the process, plan, and necessity for staying for the completion/evaluation, and the remainder of the medical screening examination    Fayrene Helperran, Donell Tomkins, Cordelia Poche-C 09/18/17 1334    Pricilla LovelessGoldston, Scott, MD 09/19/17 [phone]

## 2017-09-18 NOTE — Discharge Instructions (Signed)
Call one of the dentists offices provided to schedule an appointment for re-evaluation and further management within the next 48 hours.   I have prescribed you Penicillin VK which is an antibiotic to treat the infection and Naproxen which is an anti-inflammatory medicine to treat the pain.   Please take all of your antibiotics until finished. You may develop abdominal discomfort or diarrhea from the antibiotic.  You may help offset this with probiotics which you can buy at the store (ask your pharmacist if unable to find) or get probiotics in the form of eating yogurt. Do not eat or take the probiotics until 2 hours after your antibiotic. If you are unable to tolerate these side effects follow-up with your primary care provider or return to the emergency department.   If you begin to experience any blistering, rashes, swelling, or difficulty breathing seek medical care for evaluation of potentially more serious side effects.   Be sure to eat something when taking the Naproxen as it can cause stomach upset and at worst stomach bleeding. Do not take additional non steroidal anti-inflammatory medicines such as Ibuprofen, Aleve, or Advil while taking Naproxen. You may supplement with Tylenol.   Please be aware that this medications may interact with other medications you are taking, please be sure to discuss your medication list with your pharmacist. If you are taking birth control the antibiotic will deactivate your birth control for 2 weeks.   If you start to experience and new or worsening symptoms return to the emergency department. If you start to experience fever, chills, neck stiffness/pain, or inability to move your neck come back to the emergency department immediately.   It is also important that you follow-up with your infectious disease doctor in order to be restarted on your HIV medications.  Additionally follow-up with a primary care provider for recheck of your blood pressure as it was  elevated in the emergency department, this is likely due to not being on your blood pressure medications.

## 2017-10-23 ENCOUNTER — Telehealth: Payer: Self-pay | Admitting: *Deleted

## 2017-10-23 NOTE — Telephone Encounter (Signed)
RN following up on patient's return to care. He no-showed multiple times, will send to [facility]. Andree CossHowell, Zyier Dykema M, RN

## 2019-05-13 ENCOUNTER — Telehealth: Payer: Self-pay | Admitting: *Deleted

## 2019-05-13 NOTE — Telephone Encounter (Signed)
Patient called, asked if he could walk in for STI testing.  He has not been in the clinic since 2015, states he has been off medication (Atripla) for about 1.5 years.  He states he is somewhat concerned about his HIV, but at the moment is more concerned about being tested for STIs.   RN directed patient to the health department, primary care, [facility] or to urgent care for STI testing. He could not take a phone number, but will look up the options on his phone.   RN transferred patient to [facility] for scheduling his return to care. Andree Coss, RN

## 2019-05-20 ENCOUNTER — Other Ambulatory Visit: Payer: Self-pay

## 2019-05-20 ENCOUNTER — Other Ambulatory Visit: Payer: Self-pay | Admitting: *Deleted

## 2019-05-20 ENCOUNTER — Ambulatory Visit: Payer: Self-pay

## 2019-05-20 DIAGNOSIS — Z113 Encounter for screening for infections with a predominantly sexual mode of transmission: Secondary | ICD-10-CM

## 2019-05-20 DIAGNOSIS — Z79899 Other long term (current) drug therapy: Secondary | ICD-10-CM

## 2019-05-20 DIAGNOSIS — B20 Human immunodeficiency virus [HIV] disease: Secondary | ICD-10-CM

## 2019-06-02 ENCOUNTER — Other Ambulatory Visit: Payer: Self-pay

## 2019-06-02 ENCOUNTER — Ambulatory Visit: Payer: Self-pay

## 2019-06-02 ENCOUNTER — Encounter: Payer: Self-pay | Admitting: Family

## 2019-06-02 DIAGNOSIS — Z79899 Other long term (current) drug therapy: Secondary | ICD-10-CM

## 2019-06-02 DIAGNOSIS — Z113 Encounter for screening for infections with a predominantly sexual mode of transmission: Secondary | ICD-10-CM

## 2019-06-02 DIAGNOSIS — B20 Human immunodeficiency virus [HIV] disease: Secondary | ICD-10-CM

## 2019-06-03 LAB — URINALYSIS
Bilirubin Urine: NEGATIVE
Hgb urine dipstick: NEGATIVE
Leukocytes,Ua: NEGATIVE
Nitrite: NEGATIVE
Specific Gravity, Urine: 1.029 (ref 1.001–1.03)
pH: <=5 (ref 5.0–8.0)

## 2019-06-03 LAB — URINE CYTOLOGY ANCILLARY ONLY
Chlamydia: NEGATIVE
Comment: NEGATIVE
Comment: NORMAL
Neisseria Gonorrhea: NEGATIVE

## 2019-06-03 LAB — T-HELPER CELL (CD4) - (RCID CLINIC ONLY)
CD4 % Helper T Cell: 11 % — ABNORMAL LOW (ref 33–65)
CD4 T Cell Abs: 126 /uL — ABNORMAL LOW (ref 400–1790)

## 2019-06-04 ENCOUNTER — Telehealth: Payer: Self-pay | Admitting: *Deleted

## 2019-06-04 NOTE — Telephone Encounter (Signed)
Patient scheduled for first Bicillin injection on 06-05-2019 [facility]

## 2019-06-04 NOTE — Telephone Encounter (Signed)
RN left message asking for a return call for lab results. Andree Coss, RN

## 2019-06-04 NOTE — Telephone Encounter (Signed)
-----   Message from Veryl Speak, FNP sent at 06/04/2019 12:55 PM EDT ----- Please inform Alan Boyle that his STD testing is positive for syphilis which will need to be treated. He will need 2.4 million units of Bicillin IM weekly x 3.

## 2019-06-05 ENCOUNTER — Encounter: Payer: Self-pay | Admitting: Family

## 2019-06-05 ENCOUNTER — Other Ambulatory Visit: Payer: Self-pay

## 2019-06-05 ENCOUNTER — Ambulatory Visit: Payer: Self-pay | Admitting: Pharmacist

## 2019-06-05 ENCOUNTER — Ambulatory Visit (INDEPENDENT_AMBULATORY_CARE_PROVIDER_SITE_OTHER): Payer: Self-pay

## 2019-06-05 DIAGNOSIS — A539 Syphilis, unspecified: Secondary | ICD-10-CM

## 2019-06-05 MED ORDER — PENICILLIN G BENZATHINE 1200000 UNIT/2ML IM SUSP
1.2000 10*6.[IU] | Freq: Once | INTRAMUSCULAR | Status: AC
  Administered 2019-06-05: 1.2 10*6.[IU] via INTRAMUSCULAR

## 2019-06-09 ENCOUNTER — Telehealth: Payer: Self-pay

## 2019-06-09 NOTE — Telephone Encounter (Signed)
COVID-19 Pre-Screening Questions:06/09/19  Do you currently have a fever (>100 F), chills or unexplained body aches?NO   Are you currently experiencing new cough, shortness of breath, sore throat, runny nose?NO  .  Have you recently travelled outside the state of [place] in the last 14 days? NO .  Have you been in contact with someone that is currently pending confirmation of Covid19 testing or has been confirmed to have the Covid19 virus?  NO  **If the patient answers NO to ALL questions -  advise the patient to please call the clinic before coming to the office should any symptoms develop.     

## 2019-06-10 ENCOUNTER — Ambulatory Visit (INDEPENDENT_AMBULATORY_CARE_PROVIDER_SITE_OTHER): Payer: Self-pay | Admitting: Pharmacist

## 2019-06-10 ENCOUNTER — Telehealth: Payer: Self-pay | Admitting: Pharmacy Technician

## 2019-06-10 ENCOUNTER — Ambulatory Visit (INDEPENDENT_AMBULATORY_CARE_PROVIDER_SITE_OTHER): Payer: Self-pay | Admitting: Family

## 2019-06-10 ENCOUNTER — Encounter: Payer: Self-pay | Admitting: Family

## 2019-06-10 ENCOUNTER — Other Ambulatory Visit: Payer: Self-pay

## 2019-06-10 ENCOUNTER — Ambulatory Visit: Payer: Self-pay

## 2019-06-10 VITALS — BP 148/99 | HR 83 | Temp 98.1°F | Ht 74.0 in | Wt 286.0 lb

## 2019-06-10 DIAGNOSIS — B370 Candidal stomatitis: Secondary | ICD-10-CM

## 2019-06-10 DIAGNOSIS — B20 Human immunodeficiency virus [HIV] disease: Secondary | ICD-10-CM

## 2019-06-10 DIAGNOSIS — A539 Syphilis, unspecified: Secondary | ICD-10-CM

## 2019-06-10 DIAGNOSIS — K629 Disease of anus and rectum, unspecified: Secondary | ICD-10-CM | POA: Insufficient documentation

## 2019-06-10 DIAGNOSIS — Z113 Encounter for screening for infections with a predominantly sexual mode of transmission: Secondary | ICD-10-CM

## 2019-06-10 MED ORDER — SULFAMETHOXAZOLE-TRIMETHOPRIM 800-160 MG PO TABS: 1.0000 | ORAL_TABLET | Freq: Every day | ORAL | 2 refills | Status: DC

## 2019-06-10 MED ORDER — METFORMIN HCL 500 MG PO TABS: 500.0000 mg | ORAL_TABLET | Freq: Two times a day (BID) | ORAL | 1 refills | Status: DC

## 2019-06-10 MED ORDER — BICTEGRAVIR-EMTRICITAB-TENOFOV 50-200-25 MG PO TABS: 1.0000 | ORAL_TABLET | Freq: Every day | ORAL | 2 refills | Status: DC

## 2019-06-10 MED ORDER — FLUCONAZOLE 200 MG PO TABS: 200.0000 mg | ORAL_TABLET | Freq: Every day | ORAL | 0 refills | Status: DC

## 2019-06-10 MED ORDER — PENICILLIN G BENZATHINE 1200000 UNIT/2ML IM SUSP
1.2000 10*6.[IU] | Freq: Once | INTRAMUSCULAR | Status: AC
  Administered 2019-06-10: 1.2 10*6.[IU] via INTRAMUSCULAR

## 2019-06-10 MED FILL — BIKTARVY 50-200-25 MG TABS: 50-200-25 | 30 days supply | Qty: 30 | Fill #0

## 2019-06-10 MED FILL — FLUCONAZOLE 200 MG TAB: 200 | 14 days supply | Qty: 14 | Fill #0

## 2019-06-10 MED FILL — METFORMIN HCL 500 MG TABS: 500 | 30 days supply | Qty: 60 | Fill #0

## 2019-06-10 MED FILL — SULFAMETHOXAZOLE-TMP DS TAB: 800-160 | 30 days supply | Qty: 30 | Fill #0

## 2019-06-10 NOTE — Assessment & Plan Note (Signed)
Alan Boyle is a 39 y/o male with AIDS returning to care after a 6 year absence with poorly controlled virus with CD4 count of 126. He appears to have thrush and other fungal infection and without evidence of other opportunistic infection. We reviewed his lab work and discussed the plan of care. Pharmacy staff has obtained [facility] [employer] to get Biktarvy. He will also start Bactrim as well for OI prophylaxis. Await genotype results and viral load. Plan for follow up in 1 month or sooner if needed with lab work on the same day.

## 2019-06-10 NOTE — Patient Instructions (Addendum)
Nice to see you.  We will check your swabs today for gonorrhea and chlamydia.  Please ensure you have a follow up office visit for your 3rd injection of penicillin.   Start taking the fluconazole, Bactrim and Biktarvy daily.  Start Metformin twice daily.  Plan for follow up in 1 month or sooner if needed.   Let us know if you have any problems obtaining medication.  Have a great day and stay safe!

## 2019-06-10 NOTE — Assessment & Plan Note (Signed)
Mr. Ginyard has oral candidiasis secondary to immunocompromised status with poorly controlled AIDS. Will start fluconazole 200 mg daily for 14 days.

## 2019-06-10 NOTE — Assessment & Plan Note (Signed)
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 Second dose of 2.4 million units of penicillin given today. He will need one additional week of 2.4 million units of penicillin next week to complete treatment. Currently without neurosyphilis symptoms. Continue to monitor.

## 2019-06-10 NOTE — Telephone Encounter (Addendum)
[facility] Patient Advocate Encounter    Findings of the benefits investigation:   Insurance: uninsured RW currently approved 06/02/2019, umap pending   [facility] Patient Advocate will follow up once patient arrives for their appointment and can apply for a bridge fill in [place] if needed while umap application is processing.   Up to 53-month coverage to cover patient while application is pending. He will fill Biktarvy and three other medications at [facility] this month.  Beulah Gandy, CPhT Specialty Pharmacy Patient [facility] Phone: [phone] Fax: [phone] 06/10/2019 2:17 PM

## 2019-06-10 NOTE — Assessment & Plan Note (Signed)
Appear consistent with fungal type infection that will be covered with fluconazole. He is sexually active so will obtain swabs to rule out gonorrhea and chlamydia.

## 2019-06-10 NOTE — Progress Notes (Signed)
Subjective:    Patient ID: Alan Boyle, male    DOB: May 03, 1980, 39 y.o.   MRN: [id number]  Chief Complaint  Patient presents with  . New Patient (Initial Visit)    Pt stated---blister (mouth), white discoloration/itching rectum area and Left thight--2 months    HPI:  Alan Boyle is a 39 y.o. male with HIV disease who was last seen in the office on 09/26/2013 with good adherence and tolerance to his ART regimen of Atripla and sporadic follow-ups.  Alan Boyle is returning to care today with most recent blood work completed on 06/02/2019 with a CD4 count of 126 and a viral load that remains pending with genotype.  RPR was positive for syphilis with 1: 64 and has received 1 course of 2,400,000 units of Bicillin last week and due for second course today.  Urine was negative for gonorrhea and chlamydia.  Alan Boyle has not been taking his medication over the last 6 years and stopped medication because he had reached undetectable status and was on a new spiritual journey. He returns today with several concerns including white coating on his tongue, a rash on his left thigh and a rash/lesions located around his rectum. Denies headaches, changes in vision, neck pain, nausea, vomiting, weight loss, cough, or fever.   Alan Boyle has been approved for [employer] assistance and has UMAP/ADAP pending. Denies any current feelings of being down, depressed or hopeless recently. He does remain sexually active at present. He is a some day smoker of black and milds. No recreational or illicit drug use or alcohol consumption. 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    No Known Allergies    Outpatient Medications Prior to Visit  Medication Sig Dispense Refill  . efavirenz-emtricitabine-tenofovir (ATRIPLA) 600-200-300 MG tablet Take 1 tablet by mouth daily. (Patient not taking: Reported on 06/10/2019) 30 tablet 1  . erythromycin ophthalmic ointment Place a 1/2 inch ribbon of  ointment into the lower eyelid. (Patient not taking: Reported on 06/10/2019) 3.5 g 0  . lisinopril (PRINIVIL,ZESTRIL) 10 MG tablet Take 1 tablet (10 mg total) by mouth daily. (Patient not taking: Reported on 06/10/2019) 30 tablet 0  . colchicine 0.6 MG tablet Take two tabs for the first dose, then take one tab daily until pain resolves. (Patient not taking: Reported on 06/10/2019) 30 tablet 0  . magic mouthwash w/lidocaine SOLN Take 5 mLs by mouth 3 (three) times daily as needed for mouth pain. (Patient not taking: Reported on 06/10/2019) 100 mL 0  . naproxen (NAPROSYN) 375 MG tablet Take 1 tablet (375 mg total) by mouth 2 (two) times daily. (Patient not taking: Reported on 06/10/2019) 20 tablet 0   No facility-administered medications prior to visit.     Past Medical History:  Diagnosis Date  . Diabetes mellitus    NO MEDS (FINANCIAL ISSUE)  . HIV disease (HCC)   . Hypertension    NO MEDS (FINANCIAL ISSUE)  . Rectal mass   . Rectal pain   . Syphilis       Past Surgical History:  Procedure Laterality Date  . TUMOR EXCISION N/A 12/16/2012   Procedure: EXAM UNDER ANESTHESIA AND REMOVAL OF RECTAL MASS;  Surgeon: Mariella Saa, MD;  Location: [facility];  Service: General;  Laterality: N/A;      History reviewed. No pertinent family history.    Social History   Socioeconomic History  . Marital status: Single    Spouse  name: Not on file  . Number of children: 0  . Years of education: 13  . Highest education level: Not on file  Occupational History  . Occupation: Materials engineer    Comment: Serving Food   Tobacco Use  . Smoking status: Current Some Day Smoker    Types: Cigars  . Smokeless tobacco: Never Used  . Tobacco comment: OCCASIONAL CIGAR  Substance and Sexual Activity  . Alcohol use: No  . Drug use: No  . Sexual activity: Not Currently    Partners: Male    Comment: pt. given condoms  Other Topics Concern  . Not on file  Social History Narrative   . Not on file   Social Determinants of Health   Financial Resource Strain:   . Difficulty of Paying Living Expenses:   Food Insecurity:   . Worried About Programme researcher, broadcasting/film/video in the Last Year:   . Barista in the Last Year:   Transportation Needs:   . Freight forwarder (Medical):   Marland Kitchen Lack of Transportation (Non-Medical):   Physical Activity:   . Days of Exercise per Week:   . Minutes of Exercise per Session:   Stress:   . Feeling of Stress :   Social Connections:   . Frequency of Communication with Friends and Family:   . Frequency of Social Gatherings with Friends and Family:   . Attends Religious Services:   . Active Member of Clubs or Organizations:   . Attends Banker Meetings:   Marland Kitchen Marital Status:   Intimate Partner Violence:   . Fear of Current or Ex-Partner:   . Emotionally Abused:   Marland Kitchen Physically Abused:   . Sexually Abused:       Review of Systems     Objective:    BP (!) 148/99   Pulse 83   Temp 98.1 F (36.7 C)   Ht 6\' 2"  (1.88 m)   Wt 286 lb (129.7 kg)   SpO2 99%   BMI 36.72 kg/m  Nursing note and vital signs reviewed.  Physical Exam Constitutional:      General: He is not in acute distress.    Appearance: He is well-developed. He is obese.  HENT:     Mouth/Throat:     Comments: White patches noted on tongue and around mouth.  Cardiovascular:     Rate and Rhythm: Normal rate and regular rhythm.     Heart sounds: Normal heart sounds.  Pulmonary:     Effort: Pulmonary effort is normal.     Breath sounds: Normal breath sounds.  Genitourinary:    Comments: Rectum with multiple circular lesion with white appearance and mildly red bases.  Skin:    General: Skin is warm and dry.  Neurological:     Mental Status: He is alert and oriented to person, place, and time.  Psychiatric:        Behavior: Behavior normal.        Thought Content: Thought content normal.        Judgment: Judgment normal.         Assessment &  Plan:   Patient Active Problem List   Diagnosis Date Noted  . Oral candidiasis 06/10/2019  . Rectal lesion 06/10/2019  . Tobacco use disorder 09/26/2013  . Obese 08/15/2011  . SYPHILIS 03/29/2010  . AIDS (acquired immune deficiency syndrome) (HCC) 05/06/2009  . DIABETES MELLITUS, TYPE II 05/06/2009  . DEPRESSION 05/06/2009  . HYPERTENSION 05/06/2009  Problem List Items Addressed This Visit      Digestive   Oral candidiasis    Alan Boyle has oral candidiasis secondary to immunocompromised status with poorly controlled AIDS. Will start fluconazole 200 mg daily for 14 days.       Relevant Medications   sulfamethoxazole-trimethoprim (BACTRIM DS) 800-160 MG tablet   fluconazole (DIFLUCAN) 200 MG tablet   bictegravir-emtricitabine-tenofovir AF (BIKTARVY) 50-200-25 MG TABS tablet     Other   AIDS (acquired immune deficiency syndrome) [facility])    Alan Boyle is a 39 y/o male with AIDS returning to care after a 6 year absence with poorly controlled virus with CD4 count of 126. He appears to have thrush and other fungal infection and without evidence of other opportunistic infection. We reviewed his lab work and discussed the plan of care. Pharmacy staff has obtained Advancing Access to get [place]. He will also start Bactrim as well for OI prophylaxis. Await genotype results and viral load. Plan for follow up in 1 month or sooner if needed with lab work on the same day.       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 Second dose of 2.4 million units of penicillin given today. He will need one additional week of 2.4 million units of penicillin next week to complete treatment.  Currently without neurosyphilis symptoms. Continue to monitor.       Relevant Medications   sulfamethoxazole-trimethoprim (BACTRIM DS) 800-160 MG tablet   fluconazole (DIFLUCAN) 200 MG tablet   bictegravir-emtricitabine-tenofovir AF (BIKTARVY) 50-200-25 MG TABS tablet   Rectal lesion    Appear consistent with fungal type infection that will be covered with fluconazole. He is sexually active so will obtain swabs to rule out gonorrhea and chlamydia.        Other Visit Diagnoses    Screening for STDs (sexually transmitted diseases)       Relevant Orders   Cytology (oral, anal, urethral) ancillary only   Cytology (oral, anal, urethral) ancillary only       I have discontinued Gust Rung. Koppen's colchicine, magic mouthwash w/lidocaine, and naproxen. I am also having him start on sulfamethoxazole-trimethoprim, fluconazole, bictegravir-emtricitabine-tenofovir AF, and metFORMIN. Additionally, I am having him maintain his efavirenz-emtricitabine-tenofovir, erythromycin, and lisinopril. We administered penicillin g benzathine and penicillin g benzathine.   Meds ordered this encounter  Medications  . sulfamethoxazole-trimethoprim (BACTRIM DS) 800-160 MG tablet    Sig: Take 1 tablet by mouth daily.    Dispense:  30 tablet    Refill:  2    Order Specific Question:   Supervising Provider    Answer:   Carlyle Basques [[id number]]  . fluconazole (DIFLUCAN) 200 MG tablet    Sig: Take 1 tablet (200 mg total) by mouth daily.    Dispense:  14 tablet    Refill:  0    Order Specific Question:   Supervising Provider    Answer:   Carlyle Basques [[id number]]  . bictegravir-emtricitabine-tenofovir AF (BIKTARVY) 50-200-25 MG TABS tablet    Sig: Take 1 tablet by mouth daily.    Dispense:  30 tablet    Refill:  2    Order Specific Question:   Supervising Provider    Answer:   Baxter Flattery,  CYNTHIA [[id number]]  . metFORMIN (GLUCOPHAGE) 500 MG tablet    Sig: Take 1 tablet (500 mg total) by mouth 2 (two) times daily with a  meal.    Dispense:  60 tablet    Refill:  1    Order Specific Question:   Supervising Provider    Answer:   Judyann Munson [[id number]]  . penicillin g benzathine (BICILLIN LA) 1200000 UNIT/2ML injection 1.2 Million Units  . penicillin g benzathine (BICILLIN LA) 1200000 UNIT/2ML injection 1.2 Million Units     Follow-up: Return in about 1 month (around 07/11/2019), or if symptoms worsen or fail to improve.    Marcos Eke, MSN, FNP-C Nurse Practitioner [facility] Main number: [phone]

## 2019-06-10 NOTE — Progress Notes (Signed)
HPI: Alan Boyle is a 39 y.o. male who presents to the [facility] clinic today to reinitiate care for his HIV infection.  He was last seen in 2015.  Patient Active Problem List   Diagnosis Date Noted  . Tobacco use disorder 09/26/2013  . Obese 08/15/2011  . SYPHILIS 03/29/2010  . HIV DISEASE 05/06/2009  . DIABETES MELLITUS, TYPE II 05/06/2009  . DEPRESSION 05/06/2009  . HYPERTENSION 05/06/2009    Patient's Medications  New Prescriptions   No medications on file  Previous Medications   COLCHICINE 0.6 MG TABLET    Take two tabs for the first dose, then take one tab daily until pain resolves.   EFAVIRENZ-EMTRICITABINE-TENOFOVIR (ATRIPLA) 600-200-300 MG TABLET    Take 1 tablet by mouth daily.   ERYTHROMYCIN OPHTHALMIC OINTMENT    Place a 1/2 inch ribbon of ointment into the lower eyelid.   LISINOPRIL (PRINIVIL,ZESTRIL) 10 MG TABLET    Take 1 tablet (10 mg total) by mouth daily.   MAGIC MOUTHWASH W/LIDOCAINE SOLN    Take 5 mLs by mouth 3 (three) times daily as needed for mouth pain.   NAPROXEN (NAPROSYN) 375 MG TABLET    Take 1 tablet (375 mg total) by mouth 2 (two) times daily.  Modified Medications   No medications on file  Discontinued Medications   No medications on file    Allergies: No Known Allergies  Past Medical History: Past Medical History:  Diagnosis Date  . Diabetes mellitus    NO MEDS (FINANCIAL ISSUE)  . HIV disease ([place])   . Hypertension    NO MEDS (FINANCIAL ISSUE)  . Rectal mass   . Rectal pain   . Syphilis     Social History: Social History   Socioeconomic History  . Marital status: Single    Spouse name: Not on file  . Number of children: 0  . Years of education: 47  . Highest education level: Not on file  Occupational History  . Occupation: Education officer, environmental    Comment: Serving Food   Tobacco Use  . Smoking status: Current Some Day Smoker    Types: Cigars  . Smokeless tobacco: Never Used  . Tobacco comment: OCCASIONAL CIGAR  Substance and  Sexual Activity  . Alcohol use: No  . Drug use: No  . Sexual activity: Not Currently    Partners: Male    Comment: pt. given condoms  Other Topics Concern  . Not on file  Social History Narrative  . Not on file   Social Determinants of Health   Financial Resource Strain:   . Difficulty of Paying Living Expenses:   Food Insecurity:   . Worried About Charity fundraiser in the Last Year:   . Arboriculturist in the Last Year:   Transportation Needs:   . Film/video editor (Medical):   Marland Kitchen Lack of Transportation (Non-Medical):   Physical Activity:   . Days of Exercise per Week:   . Minutes of Exercise per Session:   Stress:   . Feeling of Stress :   Social Connections:   . Frequency of Communication with Friends and Family:   . Frequency of Social Gatherings with Friends and Family:   . Attends Religious Services:   . Active Member of Clubs or Organizations:   . Attends Archivist Meetings:   Marland Kitchen Marital Status:     Labs: Lab Results  Component Value Date   HIV1RNAQUANT <20 02/03/2015   HIV1RNAQUANT <20 02/19/2014  HIV1RNAQUANT <20 09/09/2013   CD4TABS 126 (L) 06/02/2019   CD4TABS 880 02/03/2015   CD4TABS 640 02/19/2014    RPR and STI Lab Results  Component Value Date   LABRPR REACTIVE (A) 06/02/2019   LABRPR NON REAC 02/03/2015   LABRPR NON REAC 02/19/2014   LABRPR NON REAC 07/18/2012   LABRPR NON REAC 08/01/2011   RPRTITER 1:64 (H) 06/02/2019   RPRTITER 1:32 (A) 03/15/2010    STI Results GC CT  06/02/2019 Negative Negative  02/19/2014 NG: Negative CT: Negative    Hepatitis B Lab Results  Component Value Date   HEPBSAB NON-REACTIVE 06/02/2019   HEPBSAG NON-REACTIVE 06/02/2019   HEPBCAB NON-REACTIVE 06/02/2019   Hepatitis C Lab Results  Component Value Date   HEPCAB NON-REACTIVE 06/02/2019   Hepatitis A Lab Results  Component Value Date   HAV NON-REACTIVE 06/02/2019   Lipids: Lab Results  Component Value Date   CHOL 137  06/02/2019   TRIG 65 06/02/2019   HDL 31 (L) 06/02/2019   CHOLHDL 4.4 06/02/2019   VLDL 12 02/03/2015   LDLCALC 91 06/02/2019    Current HIV Regimen: None - previously on Atripla  Assessment: Alan Boyle is here today to see Tammy Sours and myself to re-engage in care for his HIV infection.  He was last seen in July 2015 by Dr. Luciana Axe and was taking Atripla at that time. Last HIV viral load was undetectable in November 2016. He would like to restart medications. Tammy Sours has decided to start him on Biktarvy.  Explained that Susanne Borders is a one pill once daily medication with or without food and the importance of not missing any doses. Explained resistance and how it develops and why it is so important to take Biktarvy daily and not skip days or doses. Counseled patient to take it around the same time each day. Counseled on what to do if dose is missed, if closer to missed dose take immediately, if closer to next dose then skip and resume normal schedule.   Cautioned on possible side effects the first week or so including nausea, diarrhea, dizziness, and headaches but that they should resolve after the first couple of weeks. I reviewed patient medications and found no drug interactions. Counseled patient to separate Biktarvy from divalent cations including multivitamins. Discussed with patient to call clinic if he starts a new medication or herbal supplement. I gave the patient my card and told him to call me with any issues/questions/concerns.  His Alan Boyle is still in process, but Lorene Dy was able to get him medication assistance.   Plan: - Start Biktarvy PO once daily - F/u with Tammy Sours  Rashanna Christiana L. Sayler Mickiewicz, PharmD, BCIDP, AAHIVP, CPP Clinical Pharmacist Practitioner Infectious Diseases Clinical Pharmacist [facility] 06/10/2019, 2:58 PM

## 2019-06-10 NOTE — Assessment & Plan Note (Signed)
Alan Boyle has poorly controlled Type 2 diabetes and not currently on medication. Last random blood sugar was 211. No current symptoms. Will restart metformin and check A1c at next office visit with routine blood work.

## 2019-06-11 ENCOUNTER — Telehealth: Payer: Self-pay

## 2019-06-11 LAB — COMPLETE METABOLIC PANEL WITH GFR
AG Ratio: 0.8 (calc) — ABNORMAL LOW (ref 1.0–2.5)
ALT: 11 U/L (ref 9–46)
AST: 18 U/L (ref 10–40)
Albumin: 3.8 g/dL (ref 3.6–5.1)
Alkaline phosphatase (APISO): 57 U/L (ref 36–130)
BUN: 9 mg/dL (ref 7–25)
CO2: 29 mmol/L (ref 20–32)
Calcium: 9 mg/dL (ref 8.6–10.3)
Chloride: 99 mmol/L (ref 98–110)
Creat: 0.89 mg/dL (ref 0.60–1.35)
GFR, Est African American: 124 mL/min/{1.73_m2} (ref >=60)
GFR, Est Non African American: 107 mL/min/{1.73_m2} (ref >=60)
Globulin: 4.6 g/dL (calc) — ABNORMAL HIGH (ref 1.9–3.7)
Glucose, Bld: 211 mg/dL — ABNORMAL HIGH (ref 65–99)
Potassium: 3.6 mmol/L (ref 3.5–5.3)
Sodium: 134 mmol/L — ABNORMAL LOW (ref 135–146)
Total Bilirubin: 0.6 mg/dL (ref 0.2–1.2)
Total Protein: 8.4 g/dL — ABNORMAL HIGH (ref 6.1–8.1)

## 2019-06-11 LAB — CYTOLOGY, (ORAL, ANAL, URETHRAL) ANCILLARY ONLY
Chlamydia: NEGATIVE
Chlamydia: NEGATIVE
Comment: NEGATIVE
Comment: NEGATIVE
Comment: NORMAL
Comment: NORMAL
Neisseria Gonorrhea: NEGATIVE
Neisseria Gonorrhea: NEGATIVE

## 2019-06-11 LAB — HIV-1 RNA ULTRAQUANT REFLEX TO GENTYP+
HIV 1 RNA Quant: 221000 copies/mL — ABNORMAL HIGH
HIV-1 RNA Quant, Log: 5.34 Log copies/mL — ABNORMAL HIGH

## 2019-06-11 LAB — LIPID PANEL
Cholesterol: 137 mg/dL (ref <200)
HDL: 31 mg/dL — ABNORMAL LOW (ref >=40)
LDL Cholesterol (Calc): 91 mg/dL (calc)
Non-HDL Cholesterol (Calc): 106 mg/dL (calc) (ref <130)
Total CHOL/HDL Ratio: 4.4 (calc) (ref <5.0)
Triglycerides: 65 mg/dL (ref <150)

## 2019-06-11 LAB — FLUORESCENT TREPONEMAL AB(FTA)-IGG-BLD: Fluorescent Treponemal ABS: REACTIVE — AB

## 2019-06-11 LAB — RPR TITER: RPR Titer: 1:64 {titer} — ABNORMAL HIGH

## 2019-06-11 LAB — CBC WITH DIFFERENTIAL/PLATELET
Absolute Monocytes: 437 cells/uL (ref 200–950)
Basophils Absolute: 20 cells/uL (ref 0–200)
Basophils Relative: 0.5 %
Eosinophils Absolute: 129 cells/uL (ref 15–500)
Eosinophils Relative: 3.3 %
HCT: 40.4 % (ref 38.5–50.0)
Hemoglobin: 13.5 g/dL (ref 13.2–17.1)
Lymphs Abs: 1166 cells/uL (ref 850–3900)
MCH: 28.5 pg (ref 27.0–33.0)
MCHC: 33.4 g/dL (ref 32.0–36.0)
MCV: 85.4 fL (ref 80.0–100.0)
MPV: 10.7 fL (ref 7.5–12.5)
Monocytes Relative: 11.2 %
Neutro Abs: 2149 cells/uL (ref 1500–7800)
Neutrophils Relative %: 55.1 %
Platelets: 227 10*3/uL (ref 140–400)
RBC: 4.73 10*6/uL (ref 4.20–5.80)
RDW: 13.2 % (ref 11.0–15.0)
Total Lymphocyte: 29.9 %
WBC: 3.9 10*3/uL (ref 3.8–10.8)

## 2019-06-11 LAB — HEPATITIS B SURFACE ANTIGEN: Hepatitis B Surface Ag: NONREACTIVE

## 2019-06-11 LAB — QUANTIFERON-TB GOLD PLUS
Mitogen-NIL: >10 IU/mL
NIL: 0.04 IU/mL
QuantiFERON-TB Gold Plus: NEGATIVE
TB1-NIL: 0 IU/mL
TB2-NIL: 0.01 IU/mL

## 2019-06-11 LAB — RPR: RPR Ser Ql: REACTIVE — AB

## 2019-06-11 LAB — HEPATITIS A ANTIBODY, TOTAL: Hepatitis A AB,Total: NONREACTIVE

## 2019-06-11 LAB — HEPATITIS C ANTIBODY
Hepatitis C Ab: NONREACTIVE
SIGNAL TO CUT-OFF: 0.21 (ref <1.00)

## 2019-06-11 LAB — HEPATITIS B CORE ANTIBODY, TOTAL: Hep B Core Total Ab: NONREACTIVE

## 2019-06-11 LAB — HIV-1 GENOTYPE: HIV-1 Genotype: DETECTED — AB

## 2019-06-11 LAB — HEPATITIS B SURFACE ANTIBODY,QUALITATIVE: Hep B S Ab: NONREACTIVE

## 2019-06-11 NOTE — Telephone Encounter (Signed)
Attempted to call patient to review labs. Unable to reach patient at this time. Left voicemail to call office back. Did not leave any information regarding results. Alan Boyle, [place]

## 2019-06-11 NOTE — Telephone Encounter (Signed)
Patient called office back. Was able to update patient with lab results. Does not have any questions at this time. Lorenso Courier, [place]

## 2019-06-17 ENCOUNTER — Ambulatory Visit (INDEPENDENT_AMBULATORY_CARE_PROVIDER_SITE_OTHER): Payer: Self-pay

## 2019-06-17 ENCOUNTER — Other Ambulatory Visit: Payer: Self-pay

## 2019-06-17 DIAGNOSIS — A539 Syphilis, unspecified: Secondary | ICD-10-CM

## 2019-06-17 MED ORDER — PENICILLIN G BENZATHINE 1200000 UNIT/2ML IM SUSP
1.2000 10*6.[IU] | Freq: Once | INTRAMUSCULAR | Status: AC
  Administered 2019-06-17: 1.2 10*6.[IU] via INTRAMUSCULAR

## 2019-07-07 MED FILL — BIKTARVY 50-200-25 MG TABS: 50-200-25 | 30 days supply | Qty: 30 | Fill #1

## 2019-07-11 ENCOUNTER — Other Ambulatory Visit: Payer: Self-pay

## 2019-07-11 ENCOUNTER — Ambulatory Visit (INDEPENDENT_AMBULATORY_CARE_PROVIDER_SITE_OTHER): Payer: Self-pay | Admitting: Family

## 2019-07-11 ENCOUNTER — Encounter: Payer: Self-pay | Admitting: Family

## 2019-07-11 VITALS — BP 165/117 | HR 93 | Temp 98.4°F | Wt 292.4 lb

## 2019-07-11 DIAGNOSIS — A539 Syphilis, unspecified: Secondary | ICD-10-CM

## 2019-07-11 DIAGNOSIS — Z0000 Encounter for general adult medical examination without abnormal findings: Secondary | ICD-10-CM | POA: Insufficient documentation

## 2019-07-11 DIAGNOSIS — B20 Human immunodeficiency virus [HIV] disease: Secondary | ICD-10-CM

## 2019-07-11 DIAGNOSIS — E1165 Type 2 diabetes mellitus with hyperglycemia: Secondary | ICD-10-CM

## 2019-07-11 DIAGNOSIS — I10 Essential (primary) hypertension: Secondary | ICD-10-CM

## 2019-07-11 LAB — T-HELPER CELL (CD4) - (RCID CLINIC ONLY)
CD4 % Helper T Cell: 15 % — ABNORMAL LOW (ref 33–65)
CD4 T Cell Abs: 207 /uL — ABNORMAL LOW (ref 400–1790)

## 2019-07-11 MED ORDER — BICTEGRAVIR-EMTRICITAB-TENOFOV 50-200-25 MG PO TABS: 1.0000 | ORAL_TABLET | Freq: Every day | ORAL | 2 refills | Status: DC

## 2019-07-11 MED ORDER — METFORMIN HCL 500 MG PO TABS: 500.0000 mg | ORAL_TABLET | Freq: Two times a day (BID) | ORAL | 2 refills | Status: DC

## 2019-07-11 MED ORDER — LISINOPRIL 10 MG PO TABS: 10.0000 mg | ORAL_TABLET | Freq: Every day | ORAL | 2 refills | Status: DC

## 2019-07-11 MED ORDER — SULFAMETHOXAZOLE-TRIMETHOPRIM 800-160 MG PO TABS: 1.0000 | ORAL_TABLET | Freq: Every day | ORAL | 2 refills | Status: DC

## 2019-07-11 NOTE — Assessment & Plan Note (Signed)
Alan Boyle appears to have good adherence and tolerance to his ART regimen of Biktarvy and unfortunately has not been taking the Bactrim recently.  No signs/symptoms of opportunistic infection or progressive HIV disease.  We reviewed his previous lab work and discussed the plan of care to include adding Bactrim for OI prophylaxis as he does remain at high risk for opportunistic infections.  Check blood work today.  Continue current dose of Biktarvy and restart Bactrim.  Plan for follow-up in 1 month or sooner if needed with lab work on the same day.

## 2019-07-11 NOTE — Patient Instructions (Signed)
Nice to see you.  We will check your blood work today.  Continue to take your Exira daily as prescribed.  Restart taking Bactrim, Metformin, and lisinopril.  Refills of medication have been sent to the pharmacy.  Recommend Covid vaccination when able.  Plan for follow-up in 1 month or sooner if needed with lab work on the same day.

## 2019-07-11 NOTE — Assessment & Plan Note (Signed)
Alan Boyle has type 2 diabetes and not currently maintained on medications.  Discussed importance of nutritional interventions to reduce carbohydrates and processed/sugary foods.  Check A1c today.  Restart Metformin.  Discussed importance of continuing to work on decreasing carbohydrates as poorly controlled diabetes is a significant risk factor for complications including renal dysfunction and cardiovascular disease when combined with HIV disease.

## 2019-07-11 NOTE — Assessment & Plan Note (Signed)
Previously treated for syphilis.  Recheck RPR at next office visit.

## 2019-07-11 NOTE — Assessment & Plan Note (Signed)
Blood pressure poorly controlled today not currently taking medications.  No red flag/warning symptoms concerning for intracranial pathology.  Discussed importance of lowering blood pressure to reduce risk of heart failure and renal complications in the future.  Restart lisinopril.  Encouraged to monitor blood pressure at home as able.  Plan for follow-up in 1 month or sooner if needed.

## 2019-07-11 NOTE — Progress Notes (Signed)
Subjective:    Patient ID: BECK COFER, male    DOB: January 14, 1981, 39 y.o.   MRN: [id number]  Chief Complaint  Patient presents with   Follow-up     HPI:  BRYON PARKER is a 39 y.o. male with HIV/AIDS who was last seen in the office on 06/10/2019 returning to care with poorly controlled AIDS with CD4 count of 126 and viral load of 221,000.  RPR was positive for syphilis at 1: 64.  He was treated with 3 weekly injections of 2,400,000 units of Bicillin.  Returns today for routine follow-up.  Mr. Ferrentino has been taking his Biktarvy as prescribed and has noted looser stools when taking the medication on an empty stomach which is resolved when taking with food.  He is not currently taking Bactrim, Metformin, or lisinopril.  Overall feeling well today with no new concerns/complaints.  He does have questions regarding the Covid vaccination. Denies fevers, chills, night sweats, headaches, changes in vision, neck pain/stiffness, nausea, diarrhea, vomiting, lesions or rashes.  Mr. Bitting has no problems obtaining his medication from the pharmacy and remains covered through [employer].  Denies feelings of being down, depressed, or hopeless recently.  Continues to smoke black and milds on occasion and denies any recreational or illicit drug use or alcohol consumption.  Not currently sexually active and declines condoms.  No Known Allergies    Outpatient Medications Prior to Visit  Medication Sig Dispense Refill   bictegravir-emtricitabine-tenofovir AF (BIKTARVY) 50-200-25 MG TABS tablet Take 1 tablet by mouth daily. 30 tablet 2   metFORMIN (GLUCOPHAGE) 500 MG tablet Take 1 tablet (500 mg total) by mouth 2 (two) times daily with a meal. 60 tablet 1   efavirenz-emtricitabine-tenofovir (ATRIPLA) 600-200-300 MG tablet Take 1 tablet by mouth daily. (Patient not taking: Reported on 06/10/2019) 30 tablet 1   erythromycin ophthalmic ointment Place a 1/2 inch ribbon of ointment into the lower eyelid.  (Patient not taking: Reported on 06/10/2019) 3.5 g 0   fluconazole (DIFLUCAN) 200 MG tablet Take 1 tablet (200 mg total) by mouth daily. (Patient not taking: Reported on 07/11/2019) 14 tablet 0   lisinopril (PRINIVIL,ZESTRIL) 10 MG tablet Take 1 tablet (10 mg total) by mouth daily. (Patient not taking: Reported on 06/10/2019) 30 tablet 0   sulfamethoxazole-trimethoprim (BACTRIM DS) 800-160 MG tablet Take 1 tablet by mouth daily. (Patient not taking: Reported on 07/11/2019) 30 tablet 2   No facility-administered medications prior to visit.     Past Medical History:  Diagnosis Date   Diabetes mellitus    NO MEDS (FINANCIAL ISSUE)   HIV disease (HCC)    Hypertension    NO MEDS (FINANCIAL ISSUE)   Rectal mass    Rectal pain    Syphilis      Past Surgical History:  Procedure Laterality Date   TUMOR EXCISION N/A 12/16/2012   Procedure: EXAM UNDER ANESTHESIA AND REMOVAL OF RECTAL MASS;  Surgeon: Mariella Saa, MD;  Location: [facility];  Service: General;  Laterality: N/A;    Review of Systems  Constitutional: Negative for appetite change, chills, fatigue, fever and unexpected weight change.  Eyes: Negative for visual disturbance.  Respiratory: Negative for cough, chest tightness, shortness of breath and wheezing.   Cardiovascular: Negative for chest pain and leg swelling.  Gastrointestinal: Negative for abdominal pain, constipation, diarrhea, nausea and vomiting.  Genitourinary: Negative for dysuria, flank pain, frequency, genital sores, hematuria and urgency.  Skin: Negative for rash.  Allergic/Immunologic: Negative for immunocompromised state.  Neurological: Negative for dizziness and headaches.      Objective:    BP (!) 165/117    Pulse 93    Temp 98.4 F (36.9 C) (Oral)    Wt 292 lb 6.4 oz (132.6 kg)    BMI 37.54 kg/m  Nursing note and vital signs reviewed.  Physical Exam Constitutional:      General: He is not in acute distress.     Appearance: He is well-developed.  Eyes:     Conjunctiva/sclera: Conjunctivae normal.  Cardiovascular:     Rate and Rhythm: Normal rate and regular rhythm.     Heart sounds: Normal heart sounds. No murmur. No friction rub. No gallop.   Pulmonary:     Effort: Pulmonary effort is normal. No respiratory distress.     Breath sounds: Normal breath sounds. No wheezing or rales.  Chest:     Chest wall: No tenderness.  Abdominal:     General: Bowel sounds are normal.     Palpations: Abdomen is soft.     Tenderness: There is no abdominal tenderness.  Musculoskeletal:     Cervical back: Neck supple.  Lymphadenopathy:     Cervical: No cervical adenopathy.  Skin:    General: Skin is warm and dry.     Findings: No rash.  Neurological:     Mental Status: He is alert and oriented to person, place, and time.  Psychiatric:        Behavior: Behavior normal.        Thought Content: Thought content normal.        Judgment: Judgment normal.      Depression screen [facility] 2/9 09/26/2013 09/26/2013 10/22/2012 07/18/2012 08/15/2011  Decreased Interest 0 0 0 0 1  Down, Depressed, Hopeless 0 0 0 0 1  PHQ - 2 Score 0 0 0 0 2       Assessment & Plan:    Patient Active Problem List   Diagnosis Date Noted   Oral candidiasis 06/10/2019   Rectal lesion 06/10/2019   Tobacco use disorder 09/26/2013   Obese 08/15/2011   SYPHILIS 03/29/2010   AIDS (acquired immune deficiency syndrome) ([place]) 05/06/2009   Type 2 diabetes mellitus with hyperglycemia ([place]) 05/06/2009   DEPRESSION 05/06/2009   Essential hypertension 05/06/2009     Problem List Items Addressed This Visit      Cardiovascular and Mediastinum   Essential hypertension    Blood pressure poorly controlled today not currently taking medications.  No red flag/warning symptoms concerning for intracranial pathology.  Discussed importance of lowering blood pressure to reduce risk of heart failure and renal complications in the future.  Restart  lisinopril.  Encouraged to monitor blood pressure at home as able.  Plan for follow-up in 1 month or sooner if needed.      Relevant Medications   lisinopril (ZESTRIL) 10 MG tablet     Endocrine   Type 2 diabetes mellitus with hyperglycemia [facility])    Mr. Kleve has type 2 diabetes and not currently maintained on medications.  Discussed importance of nutritional interventions to reduce carbohydrates and processed/sugary foods.  Check A1c today.  Restart Metformin.  Discussed importance of continuing to work on decreasing carbohydrates as poorly controlled diabetes is a significant risk factor for complications including renal dysfunction and cardiovascular disease when combined with HIV disease.      Relevant Medications   metFORMIN (GLUCOPHAGE) 500 MG tablet   lisinopril (ZESTRIL) 10 MG tablet   Other Relevant Orders   HgB A1c  Other   AIDS (acquired immune deficiency syndrome) (HCC) - Primary    Mr. Deleo appears to have good adherence and tolerance to his ART regimen of Biktarvy and unfortunately has not been taking the Bactrim recently.  No signs/symptoms of opportunistic infection or progressive HIV disease.  We reviewed his previous lab work and discussed the plan of care to include adding Bactrim for OI prophylaxis as he does remain at high risk for opportunistic infections.  Check blood work today.  Continue current dose of Biktarvy and restart Bactrim.  Plan for follow-up in 1 month or sooner if needed with lab work on the same day.      Relevant Medications   sulfamethoxazole-trimethoprim (BACTRIM DS) 800-160 MG tablet   bictegravir-emtricitabine-tenofovir AF (BIKTARVY) 50-200-25 MG TABS tablet   Other Relevant Orders   COMPLETE METABOLIC PANEL WITH GFR   HIV-1 RNA quant-no reflex-bld   T-helper cell (CD4)- ([facility] clinic only)   SYPHILIS    Previously treated for syphilis.  Recheck RPR at next office visit.      Relevant Medications   sulfamethoxazole-trimethoprim  (BACTRIM DS) 800-160 MG tablet   bictegravir-emtricitabine-tenofovir AF (BIKTARVY) 50-200-25 MG TABS tablet       I have discontinued Donella Stade. Pankowski's efavirenz-emtricitabine-tenofovir, erythromycin, and fluconazole. I am also having him maintain his metFORMIN, sulfamethoxazole-trimethoprim, bictegravir-emtricitabine-tenofovir AF, and lisinopril.   Meds ordered this encounter  Medications   DISCONTD: lisinopril (ZESTRIL) 10 MG tablet    Sig: Take 1 tablet (10 mg total) by mouth daily.    Dispense:  30 tablet    Refill:  2    Order Specific Question:   Supervising Provider    Answer:   Drue Second, CYNTHIA [[id number]]   metFORMIN (GLUCOPHAGE) 500 MG tablet    Sig: Take 1 tablet (500 mg total) by mouth 2 (two) times daily with a meal.    Dispense:  60 tablet    Refill:  2    Order Specific Question:   Supervising Provider    Answer:   Drue Second, CYNTHIA [[id number]]   sulfamethoxazole-trimethoprim (BACTRIM DS) 800-160 MG tablet    Sig: Take 1 tablet by mouth daily.    Dispense:  30 tablet    Refill:  2    Order Specific Question:   Supervising Provider    Answer:   Drue Second, CYNTHIA [[id number]]   bictegravir-emtricitabine-tenofovir AF (BIKTARVY) 50-200-25 MG TABS tablet    Sig: Take 1 tablet by mouth daily.    Dispense:  30 tablet    Refill:  2    Order Specific Question:   Supervising Provider    Answer:   Drue Second, CYNTHIA [[id number]]   lisinopril (ZESTRIL) 10 MG tablet    Sig: Take 1 tablet (10 mg total) by mouth daily.    Dispense:  30 tablet    Refill:  2    Order Specific Question:   Supervising Provider    Answer:   Judyann Munson [[id number]]     Follow-up: Return in about 1 month (around 08/10/2019), or if symptoms worsen or fail to improve.   Marcos Eke, MSN, FNP-C Nurse Practitioner [facility] for Infectious Disease [facility] Main number: [phone]

## 2019-07-11 NOTE — Assessment & Plan Note (Signed)
   Hold vaccinations today with chance for Covid vaccination in the near future.  Discussed importance of safe sexual practice to reduce risk of STI.  Condoms declined.

## 2019-07-15 LAB — HIV-1 RNA QUANT-NO REFLEX-BLD
HIV 1 RNA Quant: 191 copies/mL — ABNORMAL HIGH
HIV-1 RNA Quant, Log: 2.28 Log copies/mL — ABNORMAL HIGH

## 2019-07-15 LAB — COMPLETE METABOLIC PANEL WITH GFR
AG Ratio: 1.1 (calc) (ref 1.0–2.5)
ALT: 11 U/L (ref 9–46)
AST: 15 U/L (ref 10–40)
Albumin: 4.1 g/dL (ref 3.6–5.1)
Alkaline phosphatase (APISO): 53 U/L (ref 36–130)
BUN: 11 mg/dL (ref 7–25)
CO2: 25 mmol/L (ref 20–32)
Calcium: 9.4 mg/dL (ref 8.6–10.3)
Chloride: 101 mmol/L (ref 98–110)
Creat: 0.99 mg/dL (ref 0.60–1.35)
GFR, Est African American: 109 mL/min/{1.73_m2} (ref >=60)
GFR, Est Non African American: 94 mL/min/{1.73_m2} (ref >=60)
Globulin: 3.8 g/dL (calc) — ABNORMAL HIGH (ref 1.9–3.7)
Glucose, Bld: 236 mg/dL — ABNORMAL HIGH (ref 65–99)
Potassium: 4.2 mmol/L (ref 3.5–5.3)
Sodium: 135 mmol/L (ref 135–146)
Total Bilirubin: 0.3 mg/dL (ref 0.2–1.2)
Total Protein: 7.9 g/dL (ref 6.1–8.1)

## 2019-07-15 LAB — HEMOGLOBIN A1C
Hgb A1c MFr Bld: 9.8 % of total Hgb — ABNORMAL HIGH (ref <5.7)
Mean Plasma Glucose: 235 (calc)
eAG (mmol/L): 13 (calc)

## 2019-08-07 ENCOUNTER — Telehealth: Payer: Self-pay

## 2019-08-07 NOTE — Telephone Encounter (Signed)
COVID-19 Pre-Screening Questions:08/07/19  Do you currently have a fever (>100 F), chills or unexplained body aches?NO  Are you currently experiencing new cough, shortness of breath, sore throat, runny nose? NO .  Have you recently travelled outside the state of [place] in the last 14 days? NO .  Have you been in contact with someone that is currently pending confirmation of Covid19 testing or has been confirmed to have the Covid19 virus? NO  **If the patient answers NO to ALL questions -  advise the patient to please call the clinic before coming to the office should any symptoms develop.     

## 2019-08-08 ENCOUNTER — Other Ambulatory Visit: Payer: Self-pay

## 2019-08-08 ENCOUNTER — Ambulatory Visit (INDEPENDENT_AMBULATORY_CARE_PROVIDER_SITE_OTHER): Payer: Self-pay | Admitting: Family

## 2019-08-08 ENCOUNTER — Encounter: Payer: Self-pay | Admitting: Family

## 2019-08-08 VITALS — BP 153/105 | HR 96 | Wt 296.0 lb

## 2019-08-08 DIAGNOSIS — B20 Human immunodeficiency virus [HIV] disease: Secondary | ICD-10-CM

## 2019-08-08 DIAGNOSIS — A539 Syphilis, unspecified: Secondary | ICD-10-CM

## 2019-08-08 DIAGNOSIS — I10 Essential (primary) hypertension: Secondary | ICD-10-CM

## 2019-08-08 DIAGNOSIS — E1165 Type 2 diabetes mellitus with hyperglycemia: Secondary | ICD-10-CM

## 2019-08-08 LAB — T-HELPER CELL (CD4) - (RCID CLINIC ONLY)
CD4 % Helper T Cell: 14 % — ABNORMAL LOW (ref 33–65)
CD4 T Cell Abs: 198 /uL — ABNORMAL LOW (ref 400–1790)

## 2019-08-08 NOTE — Assessment & Plan Note (Signed)
Blood pressure remains poorly controlled and above goal 140/90.  Discussed importance of taking lisinopril daily as prescribed.  Elevated blood pressure likely multifactorial given multiple comorbidities and weight.  Continue current dose of lisinopril.  May need increase in medication if blood pressure remains elevated.  Plan for follow-up in 6 weeks or sooner if needed.  May need referral to nephrology and your primary care.

## 2019-08-08 NOTE — Patient Instructions (Signed)
Nice to see you.   We will check your blood work today.   Continue to take your Biktarvy and Bactrim as prescribed daily.  Let us know if you need refills.  Recommend COVID vaccination.  Plan for follow up in 6 weeks or sooner if needed with lab work on the same day.  Have a great day and stay safe!

## 2019-08-08 NOTE — Assessment & Plan Note (Signed)
Previously treated at RPR titer 1: 64 in March.  Recheck RPR today.  No current symptoms.

## 2019-08-08 NOTE — Assessment & Plan Note (Signed)
Alan Boyle has good adherence and tolerance to his ART regimen of Biktarvy.  He has not been taking Bactrim.  Discussed importance of taking Bactrim to prevent opportunistic infection.  It is encouraging that his CD4 count is above 200 for the last month.  He will need to continue taking Bactrim for at least another month.  No signs/symptoms of opportunistic infection or progressive HIV disease.  We reviewed the lab work and discussed plan of care.  Continue current dose of Biktarvy supplemented with Bactrim for OI prophylaxis.  Plan for follow-up in 6 weeks or sooner if needed with lab work on the same day.

## 2019-08-08 NOTE — Progress Notes (Signed)
Subjective:    Patient ID: Alan Boyle, male    DOB: December 10, 1980, 39 y.o.   MRN: [id number]  Chief Complaint  Patient presents with  . Follow-up    unsure if he's taking metformin or lisinopril; discussed blood pressure elevation and med management. Has not recieved COVID vaccs.     HPI:  Alan Boyle is a 39 y.o. male with HIV/AIDS who was last seen in the office on 07/11/2019 with good adherence and tolerance to his ART regimen of Biktarvy and supplemented with Bactrim for OI prophylaxis.  Lab work at the time showed a viral load of 191 with CD4 count of 207.  He was continued on Biktarvy supplemented with Bactrim.  Alan Boyle has been taking his Biktarvy as prescribed daily with no adverse side effects or missed doses.  He has not been taking his Bactrim recently as he was unsure which medication he was supposed to be taking.  He was also taking his Metformin and lisinopril.  Overall feeling well today with no new concerns/complaints.  Denies fevers, chills, night sweats, headaches, changes in vision, neck pain/stiffness, nausea, diarrhea, vomiting, lesions or rashes.  Alan Boyle has no problems obtaining his medication from the pharmacy.  Denies feelings of being down, depressed, or hopeless recently.  No current recreational or illicit drug use or alcohol consumption.  He does smoke black and milds.  Not currently sexually active and declines condoms today.   No Known Allergies    Outpatient Medications Prior to Visit  Medication Sig Dispense Refill  . bictegravir-emtricitabine-tenofovir AF (BIKTARVY) 50-200-25 MG TABS tablet Take 1 tablet by mouth daily. 30 tablet 2  . sulfamethoxazole-trimethoprim (BACTRIM DS) 800-160 MG tablet Take 1 tablet by mouth daily. 30 tablet 2  . lisinopril (ZESTRIL) 10 MG tablet Take 1 tablet (10 mg total) by mouth daily. 30 tablet 2  . metFORMIN (GLUCOPHAGE) 500 MG tablet Take 1 tablet (500 mg total) by mouth 2 (two) times daily with a meal. 60  tablet 2   No facility-administered medications prior to visit.     Past Medical History:  Diagnosis Date  . Diabetes mellitus    NO MEDS (FINANCIAL ISSUE)  . HIV disease (HCC)   . Hypertension    NO MEDS (FINANCIAL ISSUE)  . Rectal mass   . Rectal pain   . Syphilis      Past Surgical History:  Procedure Laterality Date  . TUMOR EXCISION N/A 12/16/2012   Procedure: EXAM UNDER ANESTHESIA AND REMOVAL OF RECTAL MASS;  Surgeon: Mariella Saa, MD;  Location: [facility];  Service: General;  Laterality: N/A;       Review of Systems  Constitutional: Negative for appetite change, chills, fatigue, fever and unexpected weight change.  Eyes: Negative for visual disturbance.  Respiratory: Negative for cough, chest tightness, shortness of breath and wheezing.   Cardiovascular: Negative for chest pain and leg swelling.  Gastrointestinal: Negative for abdominal pain, constipation, diarrhea, nausea and vomiting.  Genitourinary: Negative for dysuria, flank pain, frequency, genital sores, hematuria and urgency.  Skin: Negative for rash.  Allergic/Immunologic: Negative for immunocompromised state.  Neurological: Negative for dizziness and headaches.      Objective:    BP (!) 153/105   Pulse 96   Wt 296 lb (134.3 kg)   BMI 38.00 kg/m  Nursing note and vital signs reviewed.  Physical Exam Constitutional:      General: He is not in acute distress.    Appearance: He is  well-developed. He is obese.  Eyes:     Conjunctiva/sclera: Conjunctivae normal.  Cardiovascular:     Rate and Rhythm: Normal rate and regular rhythm.     Heart sounds: Normal heart sounds. No murmur. No friction rub. No gallop.   Pulmonary:     Effort: Pulmonary effort is normal. No respiratory distress.     Breath sounds: Normal breath sounds. No wheezing or rales.  Chest:     Chest wall: No tenderness.  Abdominal:     General: Bowel sounds are normal.     Palpations: Abdomen is soft.      Tenderness: There is no abdominal tenderness.  Musculoskeletal:     Cervical back: Neck supple.  Lymphadenopathy:     Cervical: No cervical adenopathy.  Skin:    General: Skin is warm and dry.     Findings: No rash.  Neurological:     Mental Status: He is alert and oriented to person, place, and time.  Psychiatric:        Behavior: Behavior normal.        Thought Content: Thought content normal.        Judgment: Judgment normal.      Depression screen [facility] 2/9 08/08/2019 09/26/2013 09/26/2013 10/22/2012 07/18/2012  Decreased Interest 0 0 0 0 0  Down, Depressed, Hopeless 0 0 0 0 0  PHQ - 2 Score 0 0 0 0 0       Assessment & Plan:    Patient Active Problem List   Diagnosis Date Noted  . Healthcare maintenance 07/11/2019  . Oral candidiasis 06/10/2019  . Rectal lesion 06/10/2019  . Tobacco use disorder 09/26/2013  . Obese 08/15/2011  . SYPHILIS 03/29/2010  . AIDS (acquired immune deficiency syndrome) (HCC) 05/06/2009  . Type 2 diabetes mellitus with hyperglycemia (HCC) 05/06/2009  . DEPRESSION 05/06/2009  . Essential hypertension 05/06/2009     Problem List Items Addressed This Visit      Cardiovascular and Mediastinum   Essential hypertension    Blood pressure remains poorly controlled and above goal 140/90.  Discussed importance of taking lisinopril daily as prescribed.  Elevated blood pressure likely multifactorial given multiple comorbidities and weight.  Continue current dose of lisinopril.  May need increase in medication if blood pressure remains elevated.  Plan for follow-up in 6 weeks or sooner if needed.  May need referral to nephrology and your primary care.        Endocrine   Type 2 diabetes mellitus with hyperglycemia (HCC)    Currently on Metformin with no adverse side effects.  Check hemoglobin A1c today.  Continue current dose of Metformin.  Encouraged to modify carbohydrate intake and avoid processed/sugary foods.      Relevant Orders   HgB A1c      Other   AIDS (acquired immune deficiency syndrome) (HCC) - Primary    Alan Boyle has good adherence and tolerance to his ART regimen of Biktarvy.  He has not been taking Bactrim.  Discussed importance of taking Bactrim to prevent opportunistic infection.  It is encouraging that his CD4 count is above 200 for the last month.  He will need to continue taking Bactrim for at least another month.  No signs/symptoms of opportunistic infection or progressive HIV disease.  We reviewed the lab work and discussed plan of care.  Continue current dose of Biktarvy supplemented with Bactrim for OI prophylaxis.  Plan for follow-up in 6 weeks or sooner if needed with lab work on the same day.  Relevant Orders   COMPLETE METABOLIC PANEL WITH GFR   HIV-1 RNA quant-no reflex-bld   T-helper cell (CD4)- ([facility] clinic only)   SYPHILIS    Previously treated at [facility] titer 1: 64 in March.  Recheck RPR today.  No current symptoms.      Relevant Orders   RPR       I am having Alan Boyle maintain his metFORMIN, sulfamethoxazole-trimethoprim, bictegravir-emtricitabine-tenofovir AF, and lisinopril.   Follow-up: Return in about 6 weeks (around 09/19/2019).   Terri Piedra, MSN, FNP-C Nurse Practitioner [facility] for Infectious Disease [place] number: [phone]

## 2019-08-08 NOTE — Assessment & Plan Note (Signed)
Currently on Metformin with no adverse side effects.  Check hemoglobin A1c today.  Continue current dose of Metformin.  Encouraged to modify carbohydrate intake and avoid processed/sugary foods.

## 2019-08-12 LAB — RPR TITER: RPR Titer: 1:16 {titer} — ABNORMAL HIGH

## 2019-08-12 LAB — HIV-1 RNA QUANT-NO REFLEX-BLD
HIV 1 RNA Quant: 33 copies/mL — ABNORMAL HIGH
HIV-1 RNA Quant, Log: 1.52 Log copies/mL — ABNORMAL HIGH

## 2019-08-12 LAB — HEMOGLOBIN A1C
Hgb A1c MFr Bld: 9.1 % of total Hgb — ABNORMAL HIGH (ref <5.7)
Mean Plasma Glucose: 214 (calc)
eAG (mmol/L): 11.9 (calc)

## 2019-08-12 LAB — RPR: RPR Ser Ql: REACTIVE — AB

## 2019-08-12 LAB — COMPLETE METABOLIC PANEL WITH GFR
AG Ratio: 1.1 (calc) (ref 1.0–2.5)
ALT: 13 U/L (ref 9–46)
AST: 14 U/L (ref 10–40)
Albumin: 4 g/dL (ref 3.6–5.1)
Alkaline phosphatase (APISO): 56 U/L (ref 36–130)
BUN: 11 mg/dL (ref 7–25)
CO2: 27 mmol/L (ref 20–32)
Calcium: 9.2 mg/dL (ref 8.6–10.3)
Chloride: 101 mmol/L (ref 98–110)
Creat: 1.04 mg/dL (ref 0.60–1.35)
GFR, Est African American: 103 mL/min/{1.73_m2} (ref >=60)
GFR, Est Non African American: 89 mL/min/{1.73_m2} (ref >=60)
Globulin: 3.7 g/dL (calc) (ref 1.9–3.7)
Glucose, Bld: 226 mg/dL — ABNORMAL HIGH (ref 65–99)
Potassium: 3.9 mmol/L (ref 3.5–5.3)
Sodium: 135 mmol/L (ref 135–146)
Total Bilirubin: 0.4 mg/dL (ref 0.2–1.2)
Total Protein: 7.7 g/dL (ref 6.1–8.1)

## 2019-08-12 LAB — FLUORESCENT TREPONEMAL AB(FTA)-IGG-BLD: Fluorescent Treponemal ABS: REACTIVE — AB

## 2019-09-29 ENCOUNTER — Ambulatory Visit: Payer: Self-pay

## 2019-09-29 ENCOUNTER — Other Ambulatory Visit: Payer: Self-pay

## 2019-09-29 ENCOUNTER — Encounter: Payer: Self-pay | Admitting: Family

## 2019-09-29 ENCOUNTER — Ambulatory Visit (INDEPENDENT_AMBULATORY_CARE_PROVIDER_SITE_OTHER): Payer: Self-pay | Admitting: Family

## 2019-09-29 VITALS — BP 170/94 | HR 101 | Temp 98.5°F | Wt 301.0 lb

## 2019-09-29 DIAGNOSIS — A539 Syphilis, unspecified: Secondary | ICD-10-CM

## 2019-09-29 DIAGNOSIS — Z113 Encounter for screening for infections with a predominantly sexual mode of transmission: Secondary | ICD-10-CM

## 2019-09-29 DIAGNOSIS — E1165 Type 2 diabetes mellitus with hyperglycemia: Secondary | ICD-10-CM

## 2019-09-29 DIAGNOSIS — B20 Human immunodeficiency virus [HIV] disease: Secondary | ICD-10-CM

## 2019-09-29 DIAGNOSIS — I10 Essential (primary) hypertension: Secondary | ICD-10-CM

## 2019-09-29 DIAGNOSIS — Z0000 Encounter for general adult medical examination without abnormal findings: Secondary | ICD-10-CM

## 2019-09-29 MED ORDER — BICTEGRAVIR-EMTRICITAB-TENOFOV 50-200-25 MG PO TABS: 1.0000 | ORAL_TABLET | Freq: Every day | ORAL | 3 refills | Status: DC

## 2019-09-29 NOTE — Progress Notes (Signed)
Subjective:    Patient ID: Alan Boyle, male    DOB: 11-27-1980, 39 y.o.   MRN: [id number]  Chief Complaint  Patient presents with  . Follow-up    B20     HPI:  Alan Boyle is a 39 y.o. male with HIV/AIDS last seen in the office on 08/08/2019 with improved adherence to his ART regimen of Biktarvy supplemented with Bactrim for OI prophylaxis.  Viral load at the time was undetectable at 33 with CD4 count of 198.  RPR improved to 1: 16 down from previous treated level of 1: 64.  Here today for routine follow-up.  Alan Boyle continues to take his Biktarvy and Bactrim as prescribed daily with no adverse side effects or missed doses since his last office visit.  Overall feeling well today with no new concerns/complaints. Denies fevers, chills, night sweats, headaches, changes in vision, neck pain/stiffness, nausea, diarrhea, vomiting, lesions or rashes.  Alan Boyle has no problems obtaining his medication from the pharmacy and renewed his financial assistance today.  Denies feelings of being down, depressed, or hopeless recently.  No current recreational or illicit drug use or alcohol consumption.  Does smoke tobacco/cigars occasionally.   No Known Allergies    Outpatient Medications Prior to Visit  Medication Sig Dispense Refill  . lisinopril (ZESTRIL) 10 MG tablet Take 1 tablet (10 mg total) by mouth daily. 30 tablet 2  . metFORMIN (GLUCOPHAGE) 500 MG tablet Take 1 tablet (500 mg total) by mouth 2 (two) times daily with a meal. 60 tablet 2  . sulfamethoxazole-trimethoprim (BACTRIM DS) 800-160 MG tablet Take 1 tablet by mouth daily. 30 tablet 2  . bictegravir-emtricitabine-tenofovir AF (BIKTARVY) 50-200-25 MG TABS tablet Take 1 tablet by mouth daily. 30 tablet 2   No facility-administered medications prior to visit.     Past Medical History:  Diagnosis Date  . Diabetes mellitus    NO MEDS (FINANCIAL ISSUE)  . HIV disease (HCC)   . Hypertension    NO MEDS (FINANCIAL  ISSUE)  . Rectal mass   . Rectal pain   . Syphilis      Past Surgical History:  Procedure Laterality Date  . TUMOR EXCISION N/A 12/16/2012   Procedure: EXAM UNDER ANESTHESIA AND REMOVAL OF RECTAL MASS;  Surgeon: Mariella Saa, MD;  Location: [facility];  Service: General;  Laterality: N/A;       Review of Systems  Constitutional: Negative for appetite change, chills, fatigue, fever and unexpected weight change.  Eyes: Negative for visual disturbance.  Respiratory: Negative for cough, chest tightness, shortness of breath and wheezing.   Cardiovascular: Negative for chest pain and leg swelling.  Gastrointestinal: Negative for abdominal pain, constipation, diarrhea, nausea and vomiting.  Genitourinary: Negative for dysuria, flank pain, frequency, genital sores, hematuria and urgency.  Skin: Negative for rash.  Allergic/Immunologic: Negative for immunocompromised state.  Neurological: Negative for dizziness and headaches.      Objective:    BP (!) 170/94   Pulse (!) 101   Temp 98.5 F (36.9 C) (Oral)   Wt (!) 301 lb (136.5 kg)   BMI 38.65 kg/m  Nursing note and vital signs reviewed.  Physical Exam Constitutional:      General: He is not in acute distress.    Appearance: He is well-developed.  Eyes:     Conjunctiva/sclera: Conjunctivae normal.  Cardiovascular:     Rate and Rhythm: Normal rate and regular rhythm.     Heart sounds: Normal heart sounds.  No murmur heard.  No friction rub. No gallop.   Pulmonary:     Effort: Pulmonary effort is normal. No respiratory distress.     Breath sounds: Normal breath sounds. No wheezing or rales.  Chest:     Chest wall: No tenderness.  Abdominal:     General: Bowel sounds are normal.     Palpations: Abdomen is soft.     Tenderness: There is no abdominal tenderness.  Musculoskeletal:     Cervical back: Neck supple.  Lymphadenopathy:     Cervical: No cervical adenopathy.  Skin:    General: Skin is warm  and dry.     Findings: No rash.  Neurological:     Mental Status: He is alert and oriented to person, place, and time.  Psychiatric:        Behavior: Behavior normal.        Thought Content: Thought content normal.        Judgment: Judgment normal.      Depression screen [facility] 2/9 09/29/2019 08/08/2019 09/26/2013 09/26/2013 10/22/2012  Decreased Interest 0 0 0 0 0  Down, Depressed, Hopeless 0 0 0 0 0  PHQ - 2 Score 0 0 0 0 0       Assessment & Plan:    Patient Active Problem List   Diagnosis Date Noted  . Healthcare maintenance 07/11/2019  . Oral candidiasis 06/10/2019  . Rectal lesion 06/10/2019  . Tobacco use disorder 09/26/2013  . Obese 08/15/2011  . SYPHILIS 03/29/2010  . AIDS (acquired immune deficiency syndrome) (HCC) 05/06/2009  . Type 2 diabetes mellitus with hyperglycemia (HCC) 05/06/2009  . DEPRESSION 05/06/2009  . Essential hypertension 05/06/2009     Problem List Items Addressed This Visit      Cardiovascular and Mediastinum   Essential hypertension    Blood pressure remains elevated above goal 140/90 with current medication regimen.  Continue current dose of lisinopril.  No red flag/warning symptoms concerning for intracranial pathology.  May need to increase lisinopril and/or add additional medication if blood pressure remains elevated at next office visit.      Relevant Orders   COMPLETE METABOLIC PANEL WITH GFR     Endocrine   Type 2 diabetes mellitus with hyperglycemia (HCC)    Continues to take Metformin as prescribed with no adverse side effects.  Recheck A1c today.  May need to increase medication if A1c does not continue to improve.  Will need diabetic eye exam and foot exam.      Relevant Orders   HgB A1c     Other   AIDS (acquired immune deficiency syndrome) (HCC) - Primary    Alan Boyle continues to have well-controlled HIV/AIDS with good adherence and tolerance to his ART regimen of Biktarvy supplemented with Bactrim for OI prophylaxis.  No  current signs/symptoms of opportunistic infection or progressive HIV disease.  Reviewed previous lab work and discussed the plan of care.  Check blood work today.  Continue current dose of Biktarvy.  Consider discontinuation of Bactrim if viral load remains suppressed and CD4 count remains above 100.  Plan for follow-up in 3 months or sooner if needed with lab work 1 to 2 weeks prior to appointment or on same day.      Relevant Medications   bictegravir-emtricitabine-tenofovir AF (BIKTARVY) 50-200-25 MG TABS tablet   Other Relevant Orders   COMPLETE METABOLIC PANEL WITH GFR   HIV-1 RNA quant-no reflex-bld   T-helper cell (CD4)- ([facility] clinic only)   SYPHILIS  Previously treated with Bicillin and successfully reached RPR titer of 1: 16 down from 1: 64.  Continue to monitor.      Relevant Medications   bictegravir-emtricitabine-tenofovir AF (BIKTARVY) 50-200-25 MG TABS tablet   Healthcare maintenance     Discussed/recommended Covid vaccination when able.  Referral placed for routine dental care.  Discussed importance of safe sexual practice to reduce risk of STI.  Gonorrhea/chlamydia swabs and urine checked today.  Condoms provided.       Other Visit Diagnoses    Screening for STDs (sexually transmitted diseases)       Relevant Orders   Cytology (oral, anal, urethral) ancillary only   Cytology (oral, anal, urethral) ancillary only   Urine cytology ancillary only([place])       I am having Alan Boyle maintain his metFORMIN, sulfamethoxazole-trimethoprim, lisinopril, and bictegravir-emtricitabine-tenofovir AF.   Meds ordered this encounter  Medications  . bictegravir-emtricitabine-tenofovir AF (BIKTARVY) 50-200-25 MG TABS tablet    Sig: Take 1 tablet by mouth daily.    Dispense:  30 tablet    Refill:  3    Order Specific Question:   Supervising Provider    Answer:   Judyann Munson [[id number]]     Follow-up: Return in about 3 months (around 12/30/2019), or if  symptoms worsen or fail to improve.   Marcos Eke, MSN, FNP-C Nurse Practitioner [facility] for Infectious Disease [facility] Main number: [phone]

## 2019-09-29 NOTE — Assessment & Plan Note (Signed)
Alan Boyle continues to have well-controlled HIV/AIDS with good adherence and tolerance to his ART regimen of Biktarvy supplemented with Bactrim for OI prophylaxis.  No current signs/symptoms of opportunistic infection or progressive HIV disease.  Reviewed previous lab work and discussed the plan of care.  Check blood work today.  Continue current dose of Biktarvy.  Consider discontinuation of Bactrim if viral load remains suppressed and CD4 count remains above 100.  Plan for follow-up in 3 months or sooner if needed with lab work 1 to 2 weeks prior to appointment or on same day.

## 2019-09-29 NOTE — Assessment & Plan Note (Signed)
Continues to take Metformin as prescribed with no adverse side effects.  Recheck A1c today.  May need to increase medication if A1c does not continue to improve.  Will need diabetic eye exam and foot exam.

## 2019-09-29 NOTE — Assessment & Plan Note (Signed)
Previously treated with Bicillin and successfully reached RPR titer of 1: 16 down from 1: 64.  Continue to monitor.

## 2019-09-29 NOTE — Assessment & Plan Note (Signed)
Blood pressure remains elevated above goal 140/90 with current medication regimen.  Continue current dose of lisinopril.  No red flag/warning symptoms concerning for intracranial pathology.  May need to increase lisinopril and/or add additional medication if blood pressure remains elevated at next office visit.

## 2019-09-29 NOTE — Assessment & Plan Note (Addendum)
   Discussed/recommended Covid vaccination when able.  Referral placed for routine dental care.  Discussed importance of safe sexual practice to reduce risk of STI.  Gonorrhea/chlamydia swabs and urine checked today.  Condoms provided.

## 2019-09-29 NOTE — Patient Instructions (Addendum)
Nice to see you.  We will check your lab work today.  Continue to take your [place] daily as prescribed.   Continue with Bactrim pending blood work.   Plan for follow up in 3 months or sooner if needed with lab work on the same day.

## 2019-09-30 ENCOUNTER — Encounter: Payer: Self-pay | Admitting: Family

## 2019-09-30 LAB — CYTOLOGY, (ORAL, ANAL, URETHRAL) ANCILLARY ONLY
Chlamydia: NEGATIVE
Chlamydia: NEGATIVE
Comment: NEGATIVE
Comment: NEGATIVE
Comment: NORMAL
Comment: NORMAL
Neisseria Gonorrhea: NEGATIVE
Neisseria Gonorrhea: NEGATIVE

## 2019-09-30 LAB — T-HELPER CELL (CD4) - (RCID CLINIC ONLY)
CD4 % Helper T Cell: 15 % — ABNORMAL LOW (ref 33–65)
CD4 T Cell Abs: 205 /uL — ABNORMAL LOW (ref 400–1790)

## 2019-09-30 LAB — URINE CYTOLOGY ANCILLARY ONLY
Chlamydia: NEGATIVE
Comment: NEGATIVE
Comment: NORMAL
Neisseria Gonorrhea: NEGATIVE

## 2019-10-02 LAB — COMPLETE METABOLIC PANEL WITH GFR
AG Ratio: 1.2 (calc) (ref 1.0–2.5)
ALT: 10 U/L (ref 9–46)
AST: 12 U/L (ref 10–40)
Albumin: 3.9 g/dL (ref 3.6–5.1)
Alkaline phosphatase (APISO): 61 U/L (ref 36–130)
BUN: 9 mg/dL (ref 7–25)
CO2: 27 mmol/L (ref 20–32)
Calcium: 8.7 mg/dL (ref 8.6–10.3)
Chloride: 101 mmol/L (ref 98–110)
Creat: 0.85 mg/dL (ref 0.60–1.35)
GFR, Est African American: 125 mL/min/{1.73_m2} (ref >=60)
GFR, Est Non African American: 108 mL/min/{1.73_m2} (ref >=60)
Globulin: 3.3 g/dL (calc) (ref 1.9–3.7)
Glucose, Bld: 341 mg/dL — ABNORMAL HIGH (ref 65–99)
Potassium: 4 mmol/L (ref 3.5–5.3)
Sodium: 135 mmol/L (ref 135–146)
Total Bilirubin: 0.3 mg/dL (ref 0.2–1.2)
Total Protein: 7.2 g/dL (ref 6.1–8.1)

## 2019-10-02 LAB — HEMOGLOBIN A1C
Hgb A1c MFr Bld: 9.5 % of total Hgb — ABNORMAL HIGH (ref <5.7)
Mean Plasma Glucose: 226 (calc)
eAG (mmol/L): 12.5 (calc)

## 2019-10-02 LAB — HIV-1 RNA QUANT-NO REFLEX-BLD
HIV 1 RNA Quant: <20 copies/mL — AB
HIV-1 RNA Quant, Log: <1.3 Log copies/mL — AB

## 2019-10-05 ENCOUNTER — Other Ambulatory Visit: Payer: Self-pay | Admitting: Family

## 2019-10-05 DIAGNOSIS — I10 Essential (primary) hypertension: Secondary | ICD-10-CM

## 2019-10-05 DIAGNOSIS — E1165 Type 2 diabetes mellitus with hyperglycemia: Secondary | ICD-10-CM

## 2019-11-18 ENCOUNTER — Other Ambulatory Visit: Payer: Self-pay

## 2019-11-18 ENCOUNTER — Other Ambulatory Visit: Payer: Self-pay | Admitting: Radiology

## 2019-11-18 DIAGNOSIS — Z20822 Contact with and (suspected) exposure to covid-19: Secondary | ICD-10-CM

## 2019-11-19 LAB — NOVEL CORONAVIRUS, NAA: SARS-CoV-2, NAA: NOT DETECTED

## 2019-12-01 ENCOUNTER — Telehealth: Payer: Self-pay | Admitting: *Deleted

## 2019-12-01 NOTE — Telephone Encounter (Signed)
Patient called for clarification. He would like to get the covid shot, but has been exposed to covid and is having symptoms.  He states he has had a cough for 3 days.  He will get tested for covid, will wait out the duration of the quarantine.  He will get the vaccine after quarantine and resolution of symptoms. Andree Coss, RN

## 2019-12-16 ENCOUNTER — Ambulatory Visit: Payer: Self-pay | Admitting: Family

## 2020-01-27 ENCOUNTER — Telehealth: Payer: Self-pay

## 2020-01-27 ENCOUNTER — Other Ambulatory Visit: Payer: Self-pay | Admitting: Family

## 2020-01-27 DIAGNOSIS — B20 Human immunodeficiency virus [HIV] disease: Secondary | ICD-10-CM

## 2020-01-27 DIAGNOSIS — I10 Essential (primary) hypertension: Secondary | ICD-10-CM

## 2020-01-27 NOTE — Telephone Encounter (Signed)
Attempted to reach patient via phone, no answer, unable to leave a voice message. Patient is overdue for labs and office visit. ADAP up to date.  Will reach out to patient via Mychart to call the office to schedule follow up. Valarie Cones

## 2020-02-12 NOTE — Telephone Encounter (Signed)
Left patient a voicemail to contact office to schedule follow up visit.  Alan Boyle

## 2020-02-23 ENCOUNTER — Other Ambulatory Visit: Payer: Self-pay | Admitting: Family

## 2020-02-23 DIAGNOSIS — B20 Human immunodeficiency virus [HIV] disease: Secondary | ICD-10-CM

## 2020-02-23 DIAGNOSIS — I10 Essential (primary) hypertension: Secondary | ICD-10-CM

## 2020-03-08 ENCOUNTER — Ambulatory Visit: Payer: Self-pay | Admitting: Family

## 2020-03-10 ENCOUNTER — Encounter: Payer: Self-pay | Admitting: Family

## 2020-03-10 ENCOUNTER — Other Ambulatory Visit: Payer: Self-pay

## 2020-03-10 ENCOUNTER — Ambulatory Visit (INDEPENDENT_AMBULATORY_CARE_PROVIDER_SITE_OTHER): Payer: Self-pay | Admitting: Family

## 2020-03-10 VITALS — BP 170/115 | HR 99 | Temp 98.2°F | Ht 74.0 in | Wt 300.0 lb

## 2020-03-10 DIAGNOSIS — Z113 Encounter for screening for infections with a predominantly sexual mode of transmission: Secondary | ICD-10-CM

## 2020-03-10 DIAGNOSIS — B20 Human immunodeficiency virus [HIV] disease: Secondary | ICD-10-CM

## 2020-03-10 DIAGNOSIS — I10 Essential (primary) hypertension: Secondary | ICD-10-CM

## 2020-03-10 DIAGNOSIS — E1165 Type 2 diabetes mellitus with hyperglycemia: Secondary | ICD-10-CM

## 2020-03-10 DIAGNOSIS — Z0000 Encounter for general adult medical examination without abnormal findings: Secondary | ICD-10-CM

## 2020-03-10 MED ORDER — METFORMIN HCL 500 MG PO TABS: ORAL_TABLET | ORAL | 3 refills | Status: DC

## 2020-03-10 MED ORDER — LISINOPRIL 10 MG PO TABS: ORAL_TABLET | ORAL | 3 refills | Status: DC

## 2020-03-10 MED ORDER — BIKTARVY 50-200-25 MG PO TABS: 1.0000 | ORAL_TABLET | Freq: Every day | ORAL | 3 refills | Status: DC

## 2020-03-10 NOTE — Progress Notes (Signed)
Subjective:    Patient ID: Alan Boyle, male    DOB: January 07, 1981, 39 y.o.   MRN: [id number]  Chief Complaint  Patient presents with  . Follow-up    Given condoms      HPI:  Alan Boyle is a 39 y.o. male with HIV disease last seen on 09/29/2019 with good adherence and tolerance to his ART regimen of Biktarvy.  Viral load at the time was undetectable with CD4 count of 205.  Hemoglobin A1c was 9.5 with kidney function, liver function, and electrolytes within normal ranges.  No recent blood work completed.  Here today for routine follow-up.  Mr. Alan Boyle continues to take his Biktarvy daily as prescribed with no adverse side effects or missed doses since his last office visit.  Having some congestion and recently tested Covid negative.  Would like to be tested for STI with no current symptoms. Denies fevers, chills, night sweats, headaches, changes in vision, neck pain/stiffness, nausea, diarrhea, vomiting, lesions or rashes.  Mr. Alan Boyle has no problems obtaining his medication from the pharmacy.  Denies feelings of being down, depressed, or hopeless recently.  No recreational or illicit drug use, tobacco use, or alcohol consumption.  Condoms provided.  Due for routine dental care.  Declines Covid and influenza vaccines.   No Known Allergies    Outpatient Medications Prior to Visit  Medication Sig Dispense Refill  . BIKTARVY 50-200-25 MG TABS tablet TAKE 1 TABLET BY MOUTH DAILY 30 tablet 0  . lisinopril (ZESTRIL) 10 MG tablet TAKE 1 TABLET(10 MG) BY MOUTH DAILY 30 tablet 0  . metFORMIN (GLUCOPHAGE) 500 MG tablet TAKE 1 TABLET(500 MG) BY MOUTH TWICE DAILY WITH A MEAL (Patient not taking: Reported on 03/10/2020) 60 tablet 2  . sulfamethoxazole-trimethoprim (BACTRIM DS) 800-160 MG tablet Take 1 tablet by mouth daily. (Patient not taking: Reported on 03/10/2020) 30 tablet 2   No facility-administered medications prior to visit.     Past Medical History:  Diagnosis Date  .  Diabetes mellitus    NO MEDS (FINANCIAL ISSUE)  . HIV disease (HCC)   . Hypertension    NO MEDS (FINANCIAL ISSUE)  . Rectal mass   . Rectal pain   . Syphilis      Past Surgical History:  Procedure Laterality Date  . TUMOR EXCISION N/A 12/16/2012   Procedure: EXAM UNDER ANESTHESIA AND REMOVAL OF RECTAL MASS;  Surgeon: Mariella Saa, MD;  Location: [facility];  Service: General;  Laterality: N/A;       Review of Systems  Constitutional: Negative for appetite change, chills, fatigue, fever and unexpected weight change.  Eyes: Negative for visual disturbance.  Respiratory: Negative for cough, chest tightness, shortness of breath and wheezing.   Cardiovascular: Negative for chest pain and leg swelling.  Gastrointestinal: Negative for abdominal pain, constipation, diarrhea, nausea and vomiting.  Genitourinary: Negative for dysuria, flank pain, frequency, genital sores, hematuria and urgency.  Skin: Negative for rash.  Allergic/Immunologic: Negative for immunocompromised state.  Neurological: Negative for dizziness and headaches.      Objective:    BP (!) 170/115   Pulse 99   Temp 98.2 F (36.8 C) (Oral)   Ht 6\' 2"  (1.88 m)   Wt 300 lb (136.1 kg)   SpO2 95%   BMI 38.52 kg/m  Nursing note and vital signs reviewed.  Physical Exam Constitutional:      General: He is not in acute distress.    Appearance: He is well-developed.  HENT:  Mouth/Throat:     Mouth: Oropharynx is clear and moist.  Eyes:     Conjunctiva/sclera: Conjunctivae normal.  Cardiovascular:     Rate and Rhythm: Normal rate and regular rhythm.     Pulses: Intact distal pulses.     Heart sounds: Normal heart sounds. No murmur heard. No friction rub. No gallop.   Pulmonary:     Effort: Pulmonary effort is normal. No respiratory distress.     Breath sounds: Normal breath sounds. No wheezing or rales.  Chest:     Chest wall: No tenderness.  Abdominal:     General: Bowel sounds  are normal.     Palpations: Abdomen is soft.     Tenderness: There is no abdominal tenderness.  Musculoskeletal:     Cervical back: Neck supple.  Lymphadenopathy:     Cervical: No cervical adenopathy.  Skin:    General: Skin is warm and dry.     Findings: No rash.  Neurological:     Mental Status: He is alert and oriented to person, place, and time.  Psychiatric:        Mood and Affect: Mood and affect normal.        Behavior: Behavior normal.        Thought Content: Thought content normal.        Judgment: Judgment normal.      Depression screen [facility] 2/9 03/10/2020 09/29/2019 08/08/2019 09/26/2013 09/26/2013  Decreased Interest 0 0 0 0 0  Down, Depressed, Hopeless 0 0 0 0 0  PHQ - 2 Score 0 0 0 0 0       Assessment & Plan:    Patient Active Problem List   Diagnosis Date Noted  . Healthcare maintenance 07/11/2019  . Oral candidiasis 06/10/2019  . Rectal lesion 06/10/2019  . Tobacco use disorder 09/26/2013  . Obese 08/15/2011  . SYPHILIS 03/29/2010  . AIDS (acquired immune deficiency syndrome) (HCC) 05/06/2009  . Type 2 diabetes mellitus with hyperglycemia (HCC) 05/06/2009  . DEPRESSION 05/06/2009  . Essential hypertension 05/06/2009     Problem List Items Addressed This Visit      Cardiovascular and Mediastinum   Essential hypertension    Mr. Sweigert blood pressure is elevated today above goal 140/90 with current dose of lisinopril.  He has not taken his medication today.  No signs/symptoms concerning for intracranial pathology.  Encouraged to continue to monitor blood pressure at home as able.  Continue current dose of lisinopril.  May need referral for additional management by internal medicine.      Relevant Medications   lisinopril (ZESTRIL) 10 MG tablet     Endocrine   Type 2 diabetes mellitus with hyperglycemia [facility])    Mr. Eifler most recent A1c was 9.2 with current dose of Metformin which he has been out of.  Recheck hemoglobin A1c today.  If remaining  elevated may need referral to primary care for additional management.      Relevant Medications   metFORMIN (GLUCOPHAGE) 500 MG tablet   lisinopril (ZESTRIL) 10 MG tablet   Other Relevant Orders   HgB A1c     Other   AIDS (acquired immune deficiency syndrome) (HCC) - Primary    Mr. Renz appears to be doing well and has well-controlled HIV disease with good adherence and tolerance to his ART regimen of Biktarvy.  Bactrim has been discontinued.  We reviewed previous lab work and discussed plan of care.  No current signs/symptoms of opportunistic infection or progressive HIV disease.  Continue current dose of Biktarvy.  Plan for follow-up in 3 months or sooner if needed.      Relevant Medications   bictegravir-emtricitabine-tenofovir AF (BIKTARVY) 50-200-25 MG TABS tablet   Healthcare maintenance     Discussed importance of safe sexual practice to reduce risk of STI.  Condoms provided.  STI testing today.  Discussed/recommended Covid vaccine.  Declines influenza vaccine today.       Other Visit Diagnoses    Screening for STDs (sexually transmitted diseases)       Relevant Orders   Cytology (oral, anal, urethral) ancillary only   Cytology (oral, anal, urethral) ancillary only   RPR   Urine cytology ancillary only([place])       I have discontinued Donella Stade. Beaird's sulfamethoxazole-trimethoprim. I have also changed his Biktarvy. Additionally, I am having him maintain his metFORMIN and lisinopril.   Meds ordered this encounter  Medications  . bictegravir-emtricitabine-tenofovir AF (BIKTARVY) 50-200-25 MG TABS tablet    Sig: Take 1 tablet by mouth daily.    Dispense:  30 tablet    Refill:  3    Order Specific Question:   Supervising Provider    Answer:   Judyann Munson [[id number]]  . metFORMIN (GLUCOPHAGE) 500 MG tablet    Sig: TAKE 1 TABLET(500 MG) BY MOUTH TWICE DAILY WITH A MEAL    Dispense:  60 tablet    Refill:  3    Order Specific Question:   Supervising  Provider    Answer:   Judyann Munson [[id number]]  . lisinopril (ZESTRIL) 10 MG tablet    Sig: TAKE 1 TABLET(10 MG) BY MOUTH DAILY    Dispense:  30 tablet    Refill:  3    Order Specific Question:   Supervising Provider    Answer:   Judyann Munson [[id number]]     Follow-up: Return in about 3 months (around 06/08/2020), or if symptoms worsen or fail to improve.   Marcos Eke, MSN, FNP-C Nurse Practitioner [facility] for Infectious Disease [facility] Main number: [phone]

## 2020-03-10 NOTE — Assessment & Plan Note (Signed)
Alan Boyle appears to be doing well and has well-controlled HIV disease with good adherence and tolerance to his ART regimen of Biktarvy.  Bactrim has been discontinued.  We reviewed previous lab work and discussed plan of care.  No current signs/symptoms of opportunistic infection or progressive HIV disease.  Continue current dose of Biktarvy.  Plan for follow-up in 3 months or sooner if needed.

## 2020-03-10 NOTE — Assessment & Plan Note (Signed)
·   Discussed importance of safe sexual practice to reduce risk of STI.  Condoms provided.  STI testing today.  Discussed/recommended Covid vaccine.  Declines influenza vaccine today.

## 2020-03-10 NOTE — Patient Instructions (Signed)
Nice to see you.  We will check your lab work and swabs today.  Continue to take your medications as prescribed.   Refills have been sent to the pharmacy.  Recommend COVID vaccine.   Plan for follow up in 3 months or sooner if needed with lab work on the same day.  Have a great day and stay safe!

## 2020-03-10 NOTE — Assessment & Plan Note (Signed)
Alan Boyle most recent A1c was 9.2 with current dose of Metformin which he has been out of.  Recheck hemoglobin A1c today.  If remaining elevated may need referral to primary care for additional management.

## 2020-03-10 NOTE — Assessment & Plan Note (Signed)
Mr. Dunwoody blood pressure is elevated today above goal 140/90 with current dose of lisinopril.  He has not taken his medication today.  No signs/symptoms concerning for intracranial pathology.  Encouraged to continue to monitor blood pressure at home as able.  Continue current dose of lisinopril.  May need referral for additional management by internal medicine.

## 2020-03-11 LAB — HEMOGLOBIN A1C
Hgb A1c MFr Bld: 12.4 % of total Hgb — ABNORMAL HIGH (ref <5.7)
Mean Plasma Glucose: 309 mg/dL
eAG (mmol/L): 17.1 mmol/L

## 2020-03-11 LAB — URINE CYTOLOGY ANCILLARY ONLY
Chlamydia: NEGATIVE
Comment: NEGATIVE
Comment: NORMAL
Neisseria Gonorrhea: NEGATIVE

## 2020-03-11 LAB — CYTOLOGY, (ORAL, ANAL, URETHRAL) ANCILLARY ONLY
Chlamydia: NEGATIVE
Chlamydia: NEGATIVE
Comment: NEGATIVE
Comment: NEGATIVE
Comment: NORMAL
Comment: NORMAL
Neisseria Gonorrhea: NEGATIVE
Neisseria Gonorrhea: NEGATIVE

## 2020-03-11 LAB — RPR: RPR Ser Ql: REACTIVE — AB

## 2020-03-11 LAB — RPR TITER: RPR Titer: 1:8 {titer} — ABNORMAL HIGH

## 2020-03-11 LAB — FLUORESCENT TREPONEMAL AB(FTA)-IGG-BLD: Fluorescent Treponemal ABS: REACTIVE — AB

## 2020-03-15 ENCOUNTER — Other Ambulatory Visit: Payer: Self-pay | Admitting: Family

## 2020-03-15 MED ORDER — CANAGLIFLOZIN 100 MG PO TABS: 100.0000 mg | ORAL_TABLET | Freq: Every day | ORAL | 2 refills | Status: DC

## 2020-03-22 ENCOUNTER — Other Ambulatory Visit: Payer: Self-pay | Admitting: Family

## 2020-03-22 ENCOUNTER — Other Ambulatory Visit: Payer: Self-pay

## 2020-03-22 DIAGNOSIS — I10 Essential (primary) hypertension: Secondary | ICD-10-CM

## 2020-04-17 ENCOUNTER — Encounter (HOSPITAL_COMMUNITY): Payer: Self-pay

## 2020-04-17 ENCOUNTER — Other Ambulatory Visit: Payer: Self-pay

## 2020-04-17 ENCOUNTER — Emergency Department (HOSPITAL_COMMUNITY)
Admission: EM | Admit: 2020-04-17 | Discharge: 2020-04-17 | Disposition: A | Discharge status: Home / Self Care | Payer: Self-pay | Attending: Emergency Medicine | Admitting: Emergency Medicine

## 2020-04-17 ENCOUNTER — Emergency Department (HOSPITAL_COMMUNITY): Payer: Self-pay

## 2020-04-17 DIAGNOSIS — F17290 Nicotine dependence, other tobacco product, uncomplicated: Secondary | ICD-10-CM | POA: Insufficient documentation

## 2020-04-17 DIAGNOSIS — Z21 Asymptomatic human immunodeficiency virus [HIV] infection status: Secondary | ICD-10-CM | POA: Insufficient documentation

## 2020-04-17 DIAGNOSIS — I10 Essential (primary) hypertension: Secondary | ICD-10-CM | POA: Insufficient documentation

## 2020-04-17 DIAGNOSIS — Z79899 Other long term (current) drug therapy: Secondary | ICD-10-CM | POA: Insufficient documentation

## 2020-04-17 DIAGNOSIS — E1165 Type 2 diabetes mellitus with hyperglycemia: Secondary | ICD-10-CM | POA: Insufficient documentation

## 2020-04-17 DIAGNOSIS — M25561 Pain in right knee: Secondary | ICD-10-CM | POA: Insufficient documentation

## 2020-04-17 DIAGNOSIS — Z7984 Long term (current) use of oral hypoglycemic drugs: Secondary | ICD-10-CM | POA: Insufficient documentation

## 2020-04-17 MED ORDER — DICLOFENAC SODIUM 1 % EX GEL
2.0000 g | Freq: Once | CUTANEOUS | Status: AC
  Administered 2020-04-17: 2 g via TOPICAL
  Filled 2020-04-17: qty 100

## 2020-04-17 NOTE — ED Provider Notes (Signed)
[facility] Provider Note   CSN: [id number] Arrival date & time: 04/17/20  1014     History Chief Complaint  Patient presents with  . Knee Pain    Right    [facility] Boyle is a 40 y.o. male.  [facility] is a 40 y.o. male with a history of HIV, diabetes, hypertension, who presents to the emergency department for evaluation of right knee pain. He reports pain has been present over the past 2 days. 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 Although he reports this time pain is well localized to the lateral portion of the knee, he has not noted any swelling. He reports at rest he does not have much pain but with movement he has focal pain in this area of his knee. He has not noticed any redness or overlying skin changes. No pain in the calf or ankle. No numbness or weakness in the leg. Denies any associated fevers or chills. He reports that a friend gave him some prescription pain medication today which seemed to help. Reports he has been compliant with his HIV medication and follows up regularly with the infectious disease clinic. No other aggravating or alleviating factors.        Past Medical History:  Diagnosis Date  . Diabetes mellitus    NO MEDS (FINANCIAL ISSUE)  . HIV disease (HCC)   . Hypertension    NO MEDS (FINANCIAL ISSUE)  . Rectal mass   . Rectal pain   . Syphilis     Patient Active Problem List   Diagnosis Date Noted  . Healthcare maintenance 07/11/2019  . Oral candidiasis 06/10/2019  . Rectal lesion 06/10/2019  . Tobacco use disorder 09/26/2013  . Obese 08/15/2011  . SYPHILIS 03/29/2010  . AIDS (acquired immune deficiency syndrome) (HCC) 05/06/2009  . Type 2 diabetes mellitus with hyperglycemia (HCC) 05/06/2009  . DEPRESSION 05/06/2009  . Essential hypertension 05/06/2009    Past Surgical History:  Procedure Laterality Date  . TUMOR EXCISION N/A  12/16/2012   Procedure: EXAM UNDER ANESTHESIA AND REMOVAL OF RECTAL MASS;  Surgeon: Mariella Saa, MD;  Location: [facility];  Service: General;  Laterality: N/A;       No family history on file.  Social History   Tobacco Use  . Smoking status: Current Some Day Smoker    Types: Cigars  . Smokeless tobacco: Never Used  . Tobacco comment: OCCASIONAL CIGAR  Vaping Use  . Vaping Use: Never used  Substance Use Topics  . Alcohol use: No  . Drug use: No    Home Medications Prior to Admission medications   Medication Sig Start Date End Date Taking? Authorizing Provider  bictegravir-emtricitabine-tenofovir AF (BIKTARVY) 50-200-25 MG TABS tablet Take 1 tablet by mouth daily. 03/10/20   Veryl Speak, FNP  canagliflozin (INVOKANA) 100 MG TABS tablet Take 1 tablet (100 mg total) by mouth daily before breakfast. 03/15/20   Veryl Speak, FNP  lisinopril (ZESTRIL) 10 MG tablet TAKE 1 TABLET(10 MG) BY MOUTH DAILY 03/22/20   Veryl Speak, FNP  metFORMIN (GLUCOPHAGE) 500 MG tablet TAKE 1 TABLET(500 MG) BY MOUTH TWICE DAILY WITH A MEAL 03/10/20   Veryl Speak, FNP    Allergies    Patient has no known allergies.  Review of Systems   Review of Systems  Constitutional: Negative for chills and fever.  Musculoskeletal:  Positive for arthralgias and joint swelling.  Skin: Negative for color change and rash.  Neurological: Negative for weakness and numbness.    Physical Exam Updated Vital Signs BP (!) 146/96 (BP Location: Right Arm)   Pulse 85   Temp 98 F (36.7 C) (Oral)   Resp 18   SpO2 98%   Physical Exam Vitals and nursing note reviewed.  Constitutional:      General: He is not in acute distress.    Appearance: Normal appearance. He is well-developed and well-nourished. He is obese. He is not diaphoretic.  HENT:     Head: Normocephalic and atraumatic.  Eyes:     General:        Right eye: No discharge.        Left eye: No discharge.   Pulmonary:     Effort: Pulmonary effort is normal. No respiratory distress.  Musculoskeletal:        General: Tenderness present. No swelling or deformity.       Legs:     Comments: Able to range the right knee but increasing pain with flexion and extension. Distal pulses 2+, normal strength and sensation.  Skin:    General: Skin is warm and dry.  Neurological:     Mental Status: He is alert and oriented to person, place, and time.     Coordination: Coordination normal.  Psychiatric:        Mood and Affect: Mood and affect and mood normal.        Behavior: Behavior normal.     ED Results / Procedures / Treatments   Labs (all labs ordered are listed, but only abnormal results are displayed) Labs Reviewed - No data to display  EKG None  Radiology DG Knee Complete 4 Views Right  Result Date: 04/17/2020 CLINICAL DATA:  Acute right knee pain. EXAM: RIGHT KNEE - COMPLETE 4+ VIEW COMPARISON:  May 03, 2015. FINDINGS: No evidence of fracture, dislocation, or joint effusion. Mild degenerative changes seen involving medial joint space. Soft tissues are unremarkable. IMPRESSION: Mild degenerative joint disease. No acute abnormality seen in the right knee. Electronically Signed   By: Lupita Raider M.D.   On: 04/17/2020 14:28    Procedures Procedures   Medications Ordered in ED Medications  diclofenac Sodium (VOLTAREN) 1 % topical gel 2 g (2 g Topical Given 04/17/20 1505)    ED Course  I have reviewed the triage vital signs and the nursing notes.  Pertinent labs & imaging results that were available during my care of the patient were reviewed by me and considered in my medical decision making (see chart for details).    MDM Rules/Calculators/A&P                          40 year old male presents with right knee pain, present for 2 days, focal and well localized over the lateral knee and fibular head without palpable deformity or swelling, history of prior gout but patient  does not have significant pain with palpation and is able to move his knee, given the pain is so focal I have low suspicion for gout. No concern for septic arthritis. X-rays are unremarkable. Suspect patient may have soft tissue or ligamentous injury. Will treat with topical Voltaren since patient cannot tolerate NSAIDs with his HIV medications. Knee sleeve and crutches provided as well. Will have patient follow-up with sports medicine or orthopedics if symptoms are not improving. Return precautions discussed. Discharged home in  good condition.  Final Clinical Impression(s) / ED Diagnoses Final diagnoses:  Acute pain of right knee    Rx / DC Orders ED Discharge Orders    None       Legrand Rams 04/17/20 1509    Rolan Bucco, MD 04/17/20 1513

## 2020-04-17 NOTE — Discharge Instructions (Addendum)
Your x-ray shows some mild degenerative changes, your symptoms do not seem consistent with gout. Use topical Voltaren gel to help with pain and inflammation you can also ice and elevate the knee, use knee sleeve and crutches. Please follow-up with Dr. Pearletha Forge with sports medicine or Dr. Linna Caprice with orthopedics if symptoms are not improving.  If you develop significantly worsened knee pain, swelling, redness, fevers or any other new or concerning symptoms return for reevaluation.

## 2020-04-17 NOTE — ED Triage Notes (Signed)
Patient complains of right knee pain x 2 days and states he thinks related to his gout, no swelling

## 2020-05-17 ENCOUNTER — Other Ambulatory Visit: Payer: Self-pay | Admitting: Family

## 2020-05-17 DIAGNOSIS — B20 Human immunodeficiency virus [HIV] disease: Secondary | ICD-10-CM

## 2020-06-08 ENCOUNTER — Ambulatory Visit: Payer: Self-pay | Admitting: Family

## 2020-06-09 ENCOUNTER — Ambulatory Visit: Payer: Self-pay | Admitting: Family

## 2020-06-12 ENCOUNTER — Other Ambulatory Visit: Payer: Self-pay | Admitting: Family

## 2020-06-12 DIAGNOSIS — B20 Human immunodeficiency virus [HIV] disease: Secondary | ICD-10-CM

## 2020-06-18 ENCOUNTER — Encounter: Payer: Self-pay | Admitting: Family

## 2020-06-29 ENCOUNTER — Encounter: Payer: Self-pay | Admitting: Family

## 2020-06-29 ENCOUNTER — Ambulatory Visit (INDEPENDENT_AMBULATORY_CARE_PROVIDER_SITE_OTHER): Payer: Self-pay | Admitting: Family

## 2020-06-29 ENCOUNTER — Other Ambulatory Visit: Payer: Self-pay

## 2020-06-29 VITALS — BP 163/101 | HR 93 | Temp 98.1°F | Wt 304.0 lb

## 2020-06-29 DIAGNOSIS — I10 Essential (primary) hypertension: Secondary | ICD-10-CM

## 2020-06-29 DIAGNOSIS — Z0000 Encounter for general adult medical examination without abnormal findings: Secondary | ICD-10-CM

## 2020-06-29 DIAGNOSIS — B20 Human immunodeficiency virus [HIV] disease: Secondary | ICD-10-CM

## 2020-06-29 DIAGNOSIS — E1165 Type 2 diabetes mellitus with hyperglycemia: Secondary | ICD-10-CM

## 2020-06-29 DIAGNOSIS — A539 Syphilis, unspecified: Secondary | ICD-10-CM

## 2020-06-29 MED ORDER — LISINOPRIL 10 MG PO TABS: 10.0000 mg | ORAL_TABLET | Freq: Every day | ORAL | 3 refills | Status: DC

## 2020-06-29 MED ORDER — METFORMIN HCL 500 MG PO TABS
ORAL_TABLET | ORAL | 3 refills | Status: DC
Start: 2020-06-29 — End: 2021-01-28

## 2020-06-29 MED ORDER — BIKTARVY 50-200-25 MG PO TABS
1.0000 | ORAL_TABLET | Freq: Every day | ORAL | 3 refills | Status: DC
Start: 2020-06-29 — End: 2020-10-18

## 2020-06-29 MED ORDER — CANAGLIFLOZIN 100 MG PO TABS
100.0000 mg | ORAL_TABLET | Freq: Every day | ORAL | 3 refills | Status: DC
Start: 2020-06-29 — End: 2020-12-16

## 2020-06-29 NOTE — Patient Instructions (Signed)
Nice to see you.  We will check your lab work today.  Continue to take your [place] daily.  Refills of all medications have been sent to the pharmacy.   Please call [facility]) to schedule/follow up on your dental care at [phone]  Plan for follow up in 3 months or sooner if needed with lab work on the same day.  Have a great day and stay safe!

## 2020-06-29 NOTE — Assessment & Plan Note (Signed)
[facility] has poorly controlled type 2 diabetes with most recent hemoglobin A1c of 12.9.  He has not been able to get his diabetes medication from the pharmacy.  Having increased hunger, thirst, urination.  Will be due for diabetic foot and eye exam.  Check hemoglobin A1c today.  Restart Invokana and metformin.

## 2020-06-29 NOTE — Progress Notes (Signed)
Brief Narrative   Patient ID: Alan [facility]le, male    DOB: [birth year], 40 y.o.   MRN: [id number]    Subjective:    Chief Complaint  Patient presents with  . Follow-up    B20     HPI:  Alan Boyle is a 40 y.o. male with HIV disease last 03/10/2020 with well-controlled virus and good adherence and tolerance to his ART regimen of Biktarvy.  Bactrim was discontinued.  Viral load has been undetectable with CD4 count of 205.  Here today for routine follow-up.  [facility] continues to take his Biktarvy daily as prescribed with no adverse side effects or missed doses since his last office visit.  Overall feeling well and has been experiencing increased hunger, thirst, urination as he has been help of his diabetes medications. Denies fevers, chills, night sweats, headaches, changes in vision, neck pain/stiffness, nausea, diarrhea, vomiting, lesions or rashes.  [facility]le has no problems obtaining Biktarvy from the pharmacy.  Denies feelings of being down, depressed, or hopeless recently.  No recreational or illicit drug use, tobacco use, or alcohol consumption.  Declines condoms.  Routine dental care is up-to-date per recommendations.  Received his COVID-vaccine today and is now vaccinated awaiting booster in 4 to 5 months.   No Known Allergies    Outpatient Medications Prior to Visit  Medication Sig Dispense Refill  . BIKTARVY 50-200-25 MG TABS tablet TAKE 1 TABLET BY MOUTH DAILY 30 tablet 0  . canagliflozin (INVOKANA) 100 MG TABS tablet Take 1 tablet (100 mg total) by mouth daily before breakfast. 30 tablet 2  . lisinopril (ZESTRIL) 10 MG tablet TAKE 1 TABLET(10 MG) BY MOUTH DAILY 30 tablet 3  . metFORMIN (GLUCOPHAGE) 500 MG tablet TAKE 1 TABLET(500 MG) BY MOUTH TWICE DAILY WITH A MEAL 60 tablet 3   No facility-administered medications prior to visit.     Past Medical History:  Diagnosis Date  . Diabetes mellitus    NO MEDS (FINANCIAL ISSUE)  . HIV disease (HCC)   .  Hypertension    NO MEDS (FINANCIAL ISSUE)  . Rectal mass   . Rectal pain   . Syphilis      Past Surgical History:  Procedure Laterality Date  . TUMOR EXCISION N/A 12/16/2012   Procedure: EXAM UNDER ANESTHESIA AND REMOVAL OF RECTAL MASS;  Surgeon: Mariella Saa, MD;  Location: [facility] [place];  Service: General;  Laterality: N/A;       Review of Systems  Constitutional: Negative for appetite change, chills, fatigue, fever and unexpected weight change.  Eyes: Negative for visual disturbance.  Respiratory: Negative for cough, chest tightness, shortness of breath and wheezing.   Cardiovascular: Negative for chest pain and leg swelling.  Gastrointestinal: Negative for abdominal pain, constipation, diarrhea, nausea and vomiting.  Genitourinary: Negative for dysuria, flank pain, frequency, genital sores, hematuria and urgency.  Skin: Negative for rash.  Allergic/Immunologic: Negative for immunocompromised state.  Neurological: Negative for dizziness and headaches.      Objective:    BP (!) 163/101   Pulse 93   Temp 98.1 F (36.7 C) (Oral)   Wt (!) 304 lb (137.9 kg)   BMI 39.03 kg/m  Nursing note and vital signs reviewed.  Physical Exam Constitutional:      General: He is not in acute distress.    Appearance: He is well-developed. He is obese.  Eyes:     Conjunctiva/sclera: Conjunctivae normal.  Cardiovascular:     Rate and Rhythm: Normal  rate and regular rhythm.     Heart sounds: Normal heart sounds. No murmur heard. No friction rub. No gallop.   Pulmonary:     Effort: Pulmonary effort is normal. No respiratory distress.     Breath sounds: Normal breath sounds. No wheezing or rales.  Chest:     Chest wall: No tenderness.  Abdominal:     General: Bowel sounds are normal.     Palpations: Abdomen is soft.     Tenderness: There is no abdominal tenderness.  Musculoskeletal:     Cervical back: Neck supple.  Lymphadenopathy:     Cervical: No cervical  adenopathy.  Skin:    General: Skin is warm and dry.     Findings: No rash.  Neurological:     Mental Status: He is alert and oriented to person, place, and time.  Psychiatric:        Behavior: Behavior normal.        Thought Content: Thought content normal.        Judgment: Judgment normal.      Depression screen [facility] 2/9 06/29/2020 03/10/2020 09/29/2019 08/08/2019 09/26/2013  Decreased Interest 0 0 0 0 0  Down, Depressed, Hopeless 0 0 0 0 0  PHQ - 2 Score 0 0 0 0 0       Assessment & Plan:    Patient Active Problem List   Diagnosis Date Noted  . Healthcare maintenance 07/11/2019  . Oral candidiasis 06/10/2019  . Rectal lesion 06/10/2019  . Tobacco use disorder 09/26/2013  . Obese 08/15/2011  . SYPHILIS 03/29/2010  . AIDS (acquired immune deficiency syndrome) (HCC) 05/06/2009  . Type 2 diabetes mellitus with hyperglycemia (HCC) 05/06/2009  . DEPRESSION 05/06/2009  . Essential hypertension 05/06/2009     Problem List Items Addressed This Visit      Cardiovascular and Mediastinum   Essential hypertension    Blood pressure elevated above goal of 140/90 with inability to obtain medications and may need additional medication if blood pressure remains poorly controlled. No signs/symptoms concerning for intra-cranial pathology or end organ damage. Restart lisinopril.       Relevant Medications   lisinopril (ZESTRIL) 10 MG tablet     Endocrine   Type 2 diabetes mellitus with hyperglycemia [facility])    [facility] has poorly controlled type 2 diabetes with most recent hemoglobin A1c of 12.9.  He has not been able to get his diabetes medication from the pharmacy.  Having increased hunger, thirst, urination.  Will be due for diabetic foot and eye exam.  Check hemoglobin A1c today.  Restart Invokana and metformin.      Relevant Medications   metFORMIN (GLUCOPHAGE) 500 MG tablet   lisinopril (ZESTRIL) 10 MG tablet   canagliflozin (INVOKANA) 100 MG TABS tablet   Other Relevant  Orders   COMPLETE METABOLIC PANEL WITH GFR   HIV-1 RNA quant-no reflex-bld   HgB A1c     Other   AIDS (acquired immune deficiency syndrome) [facility])    [facility] continues to have well-controlled virus with good adherence and tolerance to his ART regimen of Biktarvy.  No signs/symptoms of opportunistic infection or progressive HIV.  We reviewed lab work and discussed plan of care.  Continue current dose of Biktarvy.  Plan for follow-up in 3 months or sooner if needed with lab work on the same day.      Relevant Medications   bictegravir-emtricitabine-tenofovir AF (BIKTARVY) 50-200-25 MG TABS tablet   Other Relevant Orders   COMPLETE METABOLIC PANEL  WITH GFR   T-helper cell (CD4)- ([facility] clinic only)   SYPHILIS - Primary    Previous RPR titer stable at 1: 8 from previous treatment of 1: 64.  Recheck RPR today.  No signs/symptoms.  Discussed importance of safe sexual practice to reduce risk of STI.      Relevant Medications   bictegravir-emtricitabine-tenofovir AF (BIKTARVY) 50-200-25 MG TABS tablet   Other Relevant Orders   RPR   Healthcare maintenance     Discussed importance of safe sexual practice to reduce risk of STI.  Condoms declined.  COVID booster updated today.          I have changed [facility]'s lisinopril and Biktarvy. I am also having him maintain his metFORMIN and canagliflozin.   Meds ordered this encounter  Medications  . metFORMIN (GLUCOPHAGE) 500 MG tablet    Sig: TAKE 1 TABLET(500 MG) BY MOUTH TWICE DAILY WITH A MEAL    Dispense:  60 tablet    Refill:  3    Order Specific Question:   Supervising Provider    Answer:   Judyann Munson [[id number]]  . lisinopril (ZESTRIL) 10 MG tablet    Sig: Take 1 tablet (10 mg total) by mouth daily.    Dispense:  30 tablet    Refill:  3    Order Specific Question:   Supervising Provider    Answer:   Judyann Munson [[id number]]  . canagliflozin (INVOKANA) 100 MG TABS tablet    Sig: Take 1 tablet (100 mg total) by mouth  daily before breakfast.    Dispense:  30 tablet    Refill:  3    Order Specific Question:   Supervising Provider    Answer:   Judyann Munson [[id number]]  . bictegravir-emtricitabine-tenofovir AF (BIKTARVY) 50-200-25 MG TABS tablet    Sig: Take 1 tablet by mouth daily.    Dispense:  30 tablet    Refill:  3    Order Specific Question:   Supervising Provider    Answer:   Judyann Munson [[id number]]     Follow-up: Return in about 3 months (around 09/28/2020), or if symptoms worsen or fail to improve.   Marcos Eke, MSN, FNP-C Nurse Practitioner [facility] Main number: [phone]

## 2020-06-29 NOTE — Assessment & Plan Note (Signed)
Blood pressure elevated above goal of 140/90 with inability to obtain medications and may need additional medication if blood pressure remains poorly controlled. No signs/symptoms concerning for intra-cranial pathology or end organ damage. Restart lisinopril.

## 2020-06-29 NOTE — Assessment & Plan Note (Signed)
[facility] continues to have well-controlled virus with good adherence and tolerance to his ART regimen of Biktarvy.  No signs/symptoms of opportunistic infection or progressive HIV.  We reviewed lab work and discussed plan of care.  Continue current dose of Biktarvy.  Plan for follow-up in 3 months or sooner if needed with lab work on the same day.

## 2020-06-29 NOTE — Assessment & Plan Note (Signed)
   Discussed importance of safe sexual practice to reduce risk of STI.  Condoms declined.  COVID booster updated today.

## 2020-06-29 NOTE — Assessment & Plan Note (Signed)
Previous RPR titer stable at 1: 8 from previous treatment of 1: 64.  Recheck RPR today.  No signs/symptoms.  Discussed importance of safe sexual practice to reduce risk of STI.

## 2020-06-30 LAB — T-HELPER CELL (CD4) - (RCID CLINIC ONLY)
CD4 % Helper T Cell: 19 % — ABNORMAL LOW (ref 33–65)
CD4 T Cell Abs: 313 /uL — ABNORMAL LOW (ref 400–1790)

## 2020-07-01 LAB — COMPLETE METABOLIC PANEL WITH GFR
AG Ratio: 1.4 (calc) (ref 1.0–2.5)
ALT: 13 U/L (ref 9–46)
AST: 12 U/L (ref 10–40)
Albumin: 4 g/dL (ref 3.6–5.1)
Alkaline phosphatase (APISO): 88 U/L (ref 36–130)
BUN: 9 mg/dL (ref 7–25)
CO2: 28 mmol/L (ref 20–32)
Calcium: 9 mg/dL (ref 8.6–10.3)
Chloride: 99 mmol/L (ref 98–110)
Creat: 1.1 mg/dL (ref 0.60–1.35)
GFR, Est African American: 95 mL/min/{1.73_m2} (ref >=60)
GFR, Est Non African American: 82 mL/min/{1.73_m2} (ref >=60)
Globulin: 2.9 g/dL (calc) (ref 1.9–3.7)
Glucose, Bld: 478 mg/dL — ABNORMAL HIGH (ref 65–99)
Potassium: 4 mmol/L (ref 3.5–5.3)
Sodium: 136 mmol/L (ref 135–146)
Total Bilirubin: 0.3 mg/dL (ref 0.2–1.2)
Total Protein: 6.9 g/dL (ref 6.1–8.1)

## 2020-07-01 LAB — RPR: RPR Ser Ql: REACTIVE — AB

## 2020-07-01 LAB — HIV-1 RNA QUANT-NO REFLEX-BLD
HIV 1 RNA Quant: <20 Copies/mL — ABNORMAL HIGH
HIV-1 RNA Quant, Log: <1.3 Log cps/mL — ABNORMAL HIGH

## 2020-07-01 LAB — FLUORESCENT TREPONEMAL AB(FTA)-IGG-BLD: Fluorescent Treponemal ABS: REACTIVE — AB

## 2020-07-01 LAB — HEMOGLOBIN A1C
Hgb A1c MFr Bld: 13.3 % of total Hgb — ABNORMAL HIGH (ref <5.7)
Mean Plasma Glucose: 335 mg/dL
eAG (mmol/L): 18.6 mmol/L

## 2020-07-01 LAB — RPR TITER: RPR Titer: 1:4 {titer} — ABNORMAL HIGH

## 2020-08-24 ENCOUNTER — Other Ambulatory Visit: Payer: Self-pay | Admitting: Infectious Diseases

## 2020-08-24 DIAGNOSIS — B20 Human immunodeficiency virus [HIV] disease: Secondary | ICD-10-CM

## 2020-09-28 ENCOUNTER — Ambulatory Visit: Payer: Self-pay | Admitting: Family

## 2020-10-17 ENCOUNTER — Other Ambulatory Visit: Payer: Self-pay | Admitting: Family

## 2020-10-17 DIAGNOSIS — B20 Human immunodeficiency virus [HIV] disease: Secondary | ICD-10-CM

## 2020-12-12 ENCOUNTER — Other Ambulatory Visit: Payer: Self-pay | Admitting: Family

## 2020-12-12 DIAGNOSIS — B20 Human immunodeficiency virus [HIV] disease: Secondary | ICD-10-CM

## 2020-12-14 ENCOUNTER — Encounter: Payer: Self-pay | Admitting: Family

## 2020-12-14 ENCOUNTER — Other Ambulatory Visit: Payer: Self-pay

## 2020-12-14 ENCOUNTER — Ambulatory Visit: Payer: Self-pay

## 2020-12-14 ENCOUNTER — Ambulatory Visit (INDEPENDENT_AMBULATORY_CARE_PROVIDER_SITE_OTHER): Payer: Self-pay | Admitting: Family

## 2020-12-14 VITALS — BP 164/101 | HR 87 | Temp 98.0°F | Wt 304.0 lb

## 2020-12-14 DIAGNOSIS — Z79899 Other long term (current) drug therapy: Secondary | ICD-10-CM

## 2020-12-14 DIAGNOSIS — Z113 Encounter for screening for infections with a predominantly sexual mode of transmission: Secondary | ICD-10-CM

## 2020-12-14 DIAGNOSIS — A539 Syphilis, unspecified: Secondary | ICD-10-CM

## 2020-12-14 DIAGNOSIS — Z0000 Encounter for general adult medical examination without abnormal findings: Secondary | ICD-10-CM

## 2020-12-14 DIAGNOSIS — B20 Human immunodeficiency virus [HIV] disease: Secondary | ICD-10-CM

## 2020-12-14 DIAGNOSIS — I10 Essential (primary) hypertension: Secondary | ICD-10-CM

## 2020-12-14 DIAGNOSIS — E1165 Type 2 diabetes mellitus with hyperglycemia: Secondary | ICD-10-CM

## 2020-12-14 MED ORDER — BIKTARVY 50-200-25 MG PO TABS
1.0000 | ORAL_TABLET | Freq: Every day | ORAL | 3 refills | Status: DC
Start: 2020-12-14 — End: 2021-03-15

## 2020-12-14 NOTE — Assessment & Plan Note (Signed)
Mr. Sara appears to have well-controlled virus with good adherence and tolerance to his ART regimen of Biktarvy.  No signs/symptoms of opportunistic infection.  We reviewed previous lab work and discussed plan of care.  Check blood work today.  Financial assistance expired and has been renewed.  Samples of Biktarvy provided and recorded in pharmacy log.  Check lab work today.  Continue current dose of Biktarvy.  Plan for follow-up in 3 months or sooner if needed with lab work on the same day.

## 2020-12-14 NOTE — Patient Instructions (Signed)
Nice to see you.   We will check your lab work today.  Continue to take your medication daily.  Financial assistance has been renewed.  Plan for follow up in 3 months or sooner if needed with lab work on the same day.  Have a great day!

## 2020-12-14 NOTE — Assessment & Plan Note (Signed)
Blood pressure is poorly controlled with current regimen of lisinopril.  Likely will need additional medication.  Have encouraged weight loss.  Check lab work today.  Continue current dose of lisinopril and increase to 20 mg pending approval of financial assistance.

## 2020-12-14 NOTE — Progress Notes (Signed)
Patient ID: Alan Boyle, male    DOB: 1980-10-20, 40 y.o.   MRN: [id number]  Subjective:    Chief Complaint  Patient presents with   Follow-up    B20    HPI:  Alan Boyle is a 40 y.o. male HIV disease last seen on 06/29/2020 with well-controlled virus and good adherence and tolerance to his ART regimen of Biktarvy.  Viral load was undetectable with CD4 count of 313.  RPR titer improved to 1: 4 down from treatment of 1: 16.  Here today for routine follow-up.  Alan Boyle has been taking his Biktarvy, Metformin, Invokana and Lisinopril as prescribed with no adverse side effects. Overall feeling well today with no new concerns/complaints. Denies fevers, chills, night sweats, headaches, changes in vision, neck pain/stiffness, nausea, diarrhea, vomiting, lesions or rashes.  Alan Boyle financial assistance expired on 9/30 and renewed today. Denies feelings of being down, depressed or hopeless recently. No current recreational or illicit drug use or alcohol consumption. Condoms provided. Declines vaccines. Interested in STD testing. Not currently checking blood sugars at home. No increased hunger, thirst, urination, or numbness/tingling.    No Known Allergies    Outpatient Medications Prior to Visit  Medication Sig Dispense Refill   canagliflozin (INVOKANA) 100 MG TABS tablet Take 1 tablet (100 mg total) by mouth daily before breakfast. 30 tablet 3   lisinopril (ZESTRIL) 10 MG tablet Take 1 tablet (10 mg total) by mouth daily. 30 tablet 3   metFORMIN (GLUCOPHAGE) 500 MG tablet TAKE 1 TABLET(500 MG) BY MOUTH TWICE DAILY WITH A MEAL 60 tablet 3   BIKTARVY 50-200-25 MG TABS tablet TAKE 1 TABLET BY MOUTH DAILY 30 tablet 0   No facility-administered medications prior to visit.     Past Medical History:  Diagnosis Date   Diabetes mellitus    NO MEDS (FINANCIAL ISSUE)   HIV disease (HCC)    Hypertension    NO MEDS (FINANCIAL ISSUE)   Rectal mass    Rectal pain    Syphilis       Past Surgical History:  Procedure Laterality Date   TUMOR EXCISION N/A 12/16/2012   Procedure: EXAM UNDER ANESTHESIA AND REMOVAL OF RECTAL MASS;  Surgeon: Mariella Saa, MD;  Location: [facility];  Service: General;  Laterality: N/A;      Review of Systems  Constitutional:  Negative for appetite change, chills, fatigue, fever and unexpected weight change.  Eyes:  Negative for visual disturbance.       Negative for changes in vision.  Respiratory:  Negative for cough, chest tightness, shortness of breath and wheezing.   Cardiovascular:  Negative for chest pain, palpitations and leg swelling.  Gastrointestinal:  Negative for abdominal pain, constipation, diarrhea, nausea and vomiting.  Endocrine: Negative for polydipsia, polyphagia and polyuria.  Genitourinary:  Negative for dysuria, flank pain, frequency, genital sores, hematuria and urgency.  Skin:  Negative for rash.  Allergic/Immunologic: Negative for immunocompromised state.  Neurological:  Negative for dizziness, weakness, light-headedness and headaches.     Objective:    BP (!) 164/101   Pulse 87   Temp 98 F (36.7 C) (Oral)   Wt (!) 304 lb (137.9 kg)   BMI 39.03 kg/m  Nursing note and vital signs reviewed.  Physical Exam Constitutional:      General: He is not in acute distress.    Appearance: He is well-developed. He is obese.  Eyes:     Conjunctiva/sclera: Conjunctivae normal.  Cardiovascular:  Rate and Rhythm: Normal rate and regular rhythm.     Heart sounds: Normal heart sounds. No murmur heard.   No friction rub. No gallop.  Pulmonary:     Effort: Pulmonary effort is normal. No respiratory distress.     Breath sounds: Normal breath sounds. No wheezing or rales.  Chest:     Chest wall: No tenderness.  Abdominal:     General: Bowel sounds are normal.     Palpations: Abdomen is soft.     Tenderness: There is no abdominal tenderness.  Musculoskeletal:     Cervical back: Neck  supple.  Lymphadenopathy:     Cervical: No cervical adenopathy.  Skin:    General: Skin is warm and dry.     Findings: No rash.  Neurological:     Mental Status: He is alert and oriented to person, place, and time.  Psychiatric:        Behavior: Behavior normal.        Thought Content: Thought content normal.        Judgment: Judgment normal.     Depression screen [facility] 2/9 12/14/2020 06/29/2020 03/10/2020 09/29/2019 08/08/2019  Decreased Interest 0 0 0 0 0  Down, Depressed, Hopeless 0 0 0 0 0  PHQ - 2 Score 0 0 0 0 0       Assessment & Plan:    Patient Active Problem List   Diagnosis Date Noted   Healthcare maintenance 07/11/2019   Oral candidiasis 06/10/2019   Rectal lesion 06/10/2019   Tobacco use disorder 09/26/2013   HIV disease (HCC)    Obese 08/15/2011   SYPHILIS 03/29/2010   AIDS (acquired immune deficiency syndrome) (HCC) 05/06/2009   Type 2 diabetes mellitus with hyperglycemia (HCC) 05/06/2009   DEPRESSION 05/06/2009   Essential hypertension 05/06/2009     Problem List Items Addressed This Visit       Cardiovascular and Mediastinum   Essential hypertension    Blood pressure is poorly controlled with current regimen of lisinopril.  Likely will need additional medication.  Have encouraged weight loss.  Check lab work today.  Continue current dose of lisinopril and increase to 20 mg pending approval of financial assistance.        Endocrine   Type 2 diabetes mellitus with hyperglycemia [facility])    Alan Boyle continues to have poorly controlled diabetes with most recent A1c of 13.2.  Question his adherence to his medication regimen.  Discussed importance of reducing carbohydrates and processed/sugary foods from a dietary standpoint.  His financial assistance is pending which may make refills challenging for the next couple of weeks.  Encouraged to check blood sugars at home as able.  If unable to successfully improve blood sugars will need to refer to internal  medicine/endocrinology.  Check hemoglobin A1c and micro urine albumin.  Plan for follow-up in 3 months or sooner if needed with lab work on the same day.      Relevant Orders   HgB A1c   Urine Microalbumin w/creat. ratio     Other   SYPHILIS   Relevant Medications   bictegravir-emtricitabine-tenofovir AF (BIKTARVY) 50-200-25 MG TABS tablet   Other Relevant Orders   RPR   HIV disease (HCC) - Primary    Alan Boyle appears to have well-controlled virus with good adherence and tolerance to his ART regimen of Biktarvy.  No signs/symptoms of opportunistic infection.  We reviewed previous lab work and discussed plan of care.  Check blood work today.  Financial assistance expired  and has been renewed.  Samples of Biktarvy provided and recorded in pharmacy log.  Check lab work today.  Continue current dose of Biktarvy.  Plan for follow-up in 3 months or sooner if needed with lab work on the same day.      Relevant Medications   bictegravir-emtricitabine-tenofovir AF (BIKTARVY) 50-200-25 MG TABS tablet   Other Relevant Orders   COMPLETE METABOLIC PANEL WITH GFR   HIV-1 RNA quant-no reflex-bld   T-helper cell (CD4)- ([facility] clinic only)   Healthcare maintenance    Discussed importance of safe sexual practices and condom usage.  Condoms provided. Routine vaccinations declined. STD testing.      Other Visit Diagnoses     Pharmacologic therapy       Relevant Orders   Lipid panel   Screening for STDs (sexually transmitted diseases)       Relevant Orders   RPR   Cytology (oral, anal, urethral) ancillary only   Cytology (oral, anal, urethral) ancillary only   Urine cytology ancillary only([place])        I have changed Donella Stade. Goltz's Biktarvy. I am also having him maintain his metFORMIN, lisinopril, and canagliflozin.   Meds ordered this encounter  Medications   bictegravir-emtricitabine-tenofovir AF (BIKTARVY) 50-200-25 MG TABS tablet    Sig: Take 1 tablet by mouth daily.     Dispense:  30 tablet    Refill:  3    [employer] renewal pending    Order Specific Question:   Supervising Provider    Answer:   Judyann Munson [[id number]]     Follow-up: Return in about 3 months (around 03/16/2021).   Marcos Eke, MSN, FNP-C Nurse Practitioner [facility]ious [facility] Main number: [phone]

## 2020-12-14 NOTE — Assessment & Plan Note (Addendum)
   Discussed importance of safe sexual practices and condom usage.  Condoms provided.  Routine vaccinations declined.  STD testing.

## 2020-12-14 NOTE — Assessment & Plan Note (Signed)
Alan Boyle continues to have poorly controlled diabetes with most recent A1c of 13.2.  Question his adherence to his medication regimen.  Discussed importance of reducing carbohydrates and processed/sugary foods from a dietary standpoint.  His financial assistance is pending which may make refills challenging for the next couple of weeks.  Encouraged to check blood sugars at home as able.  If unable to successfully improve blood sugars will need to refer to internal medicine/endocrinology.  Check hemoglobin A1c and micro urine albumin.  Plan for follow-up in 3 months or sooner if needed with lab work on the same day.

## 2020-12-15 LAB — CYTOLOGY, (ORAL, ANAL, URETHRAL) ANCILLARY ONLY
Chlamydia: NEGATIVE
Chlamydia: NEGATIVE
Comment: NEGATIVE
Comment: NEGATIVE
Comment: NORMAL
Comment: NORMAL
Neisseria Gonorrhea: NEGATIVE
Neisseria Gonorrhea: NEGATIVE

## 2020-12-15 LAB — URINE CYTOLOGY ANCILLARY ONLY
Chlamydia: NEGATIVE
Comment: NEGATIVE
Comment: NORMAL
Neisseria Gonorrhea: NEGATIVE

## 2020-12-16 LAB — COMPLETE METABOLIC PANEL WITH GFR
AG Ratio: 1.4 (calc) (ref 1.0–2.5)
ALT: 13 U/L (ref 9–46)
AST: 12 U/L (ref 10–40)
Albumin: 4.1 g/dL (ref 3.6–5.1)
Alkaline phosphatase (APISO): 75 U/L (ref 36–130)
BUN: 10 mg/dL (ref 7–25)
CO2: 27 mmol/L (ref 20–32)
Calcium: 8.9 mg/dL (ref 8.6–10.3)
Chloride: 99 mmol/L (ref 98–110)
Creat: 0.93 mg/dL (ref 0.60–1.29)
Globulin: 3 g/dL (calc) (ref 1.9–3.7)
Glucose, Bld: 300 mg/dL — ABNORMAL HIGH (ref 65–99)
Potassium: 4 mmol/L (ref 3.5–5.3)
Sodium: 136 mmol/L (ref 135–146)
Total Bilirubin: 0.5 mg/dL (ref 0.2–1.2)
Total Protein: 7.1 g/dL (ref 6.1–8.1)
eGFR: 105 mL/min/{1.73_m2} (ref >=60)

## 2020-12-16 LAB — HELPER T-LYMPH-CD4 (ARMC ONLY)
% CD 4 Pos. Lymph.: 19.9 % — ABNORMAL LOW (ref 30.8–58.5)
Absolute CD 4 Helper: 299 /uL — ABNORMAL LOW (ref 359–1519)
Basophils Absolute: 0.1 10*3/uL (ref 0.0–0.2)
Basos: 1 %
EOS (ABSOLUTE): 0.3 10*3/uL (ref 0.0–0.4)
Eos: 4 %
Hematocrit: 46.6 % (ref 37.5–51.0)
Hemoglobin: 15.1 g/dL (ref 13.0–17.7)
Immature Grans (Abs): 0 10*3/uL (ref 0.0–0.1)
Immature Granulocytes: 0 %
Lymphocytes Absolute: 1.5 10*3/uL (ref 0.7–3.1)
Lymphs: 24 %
MCH: 29 pg (ref 26.6–33.0)
MCHC: 32.4 g/dL (ref 31.5–35.7)
MCV: 89 fL (ref 79–97)
Monocytes Absolute: 0.5 10*3/uL (ref 0.1–0.9)
Monocytes: 7 %
Neutrophils Absolute: 4 10*3/uL (ref 1.4–7.0)
Neutrophils: 64 %
Platelets: 282 10*3/uL (ref 150–450)
RBC: 5.21 x10E6/uL (ref 4.14–5.80)
RDW: 12.8 % (ref 11.6–15.4)
WBC: 6.3 10*3/uL (ref 3.4–10.8)

## 2020-12-16 LAB — LIPID PANEL
Cholesterol: 159 mg/dL (ref <200)
HDL: 39 mg/dL — ABNORMAL LOW (ref >=40)
LDL Cholesterol (Calc): 95 mg/dL (calc)
Non-HDL Cholesterol (Calc): 120 mg/dL (calc) (ref <130)
Total CHOL/HDL Ratio: 4.1 (calc) (ref <5.0)
Triglycerides: 151 mg/dL — ABNORMAL HIGH (ref <150)

## 2020-12-16 LAB — HEMOGLOBIN A1C
Hgb A1c MFr Bld: 12.4 % of total Hgb — ABNORMAL HIGH (ref <5.7)
Mean Plasma Glucose: 309 mg/dL
eAG (mmol/L): 17.1 mmol/L

## 2020-12-16 LAB — FLUORESCENT TREPONEMAL AB(FTA)-IGG-BLD: Fluorescent Treponemal ABS: REACTIVE — AB

## 2020-12-16 LAB — HIV-1 RNA QUANT-NO REFLEX-BLD
HIV 1 RNA Quant: 23 Copies/mL — ABNORMAL HIGH
HIV-1 RNA Quant, Log: 1.36 Log cps/mL — ABNORMAL HIGH

## 2020-12-16 LAB — RPR TITER: RPR Titer: 1:4 {titer} — ABNORMAL HIGH

## 2020-12-16 LAB — RPR: RPR Ser Ql: REACTIVE — AB

## 2020-12-16 MED ORDER — CANAGLIFLOZIN 300 MG PO TABS: 300.0000 mg | ORAL_TABLET | Freq: Every day | ORAL | 3 refills | Status: DC

## 2020-12-16 NOTE — Addendum Note (Signed)
Addended by: Jeanine Luz D on: 12/16/2020 04:54 PM   Modules accepted: Orders

## 2020-12-21 ENCOUNTER — Other Ambulatory Visit: Payer: Self-pay | Admitting: Pharmacist

## 2020-12-21 DIAGNOSIS — B20 Human immunodeficiency virus [HIV] disease: Secondary | ICD-10-CM

## 2020-12-21 MED ORDER — BICTEGRAVIR-EMTRICITAB-TENOFOV 50-200-25 MG PO TABS: 1.0000 | ORAL_TABLET | Freq: Every day | ORAL | 0 refills | Status: AC

## 2020-12-21 NOTE — Progress Notes (Signed)
Medication Samples have been provided to the patient.  Drug name: Biktarvy        Strength: 50/200/25 mg       Qty: 14 tablets (2 bottles) LOT: [id number]   Exp.Date: 01/11/23  Dosing instructions: Take one tablet by mouth once daily  The patient has been instructed regarding the correct time, dose, and frequency of taking this medication, including desired effects and most common side effects.   Margarite Gouge, PharmD, CPP Clinical Pharmacist Practitioner Infectious Diseases Clinical Pharmacist [facility]

## 2021-01-24 ENCOUNTER — Encounter: Payer: Self-pay | Admitting: Family

## 2021-01-24 ENCOUNTER — Other Ambulatory Visit: Payer: Self-pay | Admitting: Family

## 2021-01-24 DIAGNOSIS — E1165 Type 2 diabetes mellitus with hyperglycemia: Secondary | ICD-10-CM

## 2021-01-24 NOTE — Telephone Encounter (Signed)
Okay to refill? 

## 2021-03-15 ENCOUNTER — Ambulatory Visit: Payer: Self-pay

## 2021-03-15 ENCOUNTER — Ambulatory Visit (INDEPENDENT_AMBULATORY_CARE_PROVIDER_SITE_OTHER): Payer: Self-pay | Admitting: Family

## 2021-03-15 ENCOUNTER — Encounter: Payer: Self-pay | Admitting: Family

## 2021-03-15 ENCOUNTER — Other Ambulatory Visit: Payer: Self-pay

## 2021-03-15 VITALS — BP 169/114 | HR 85 | Temp 97.9°F | Wt 302.0 lb

## 2021-03-15 DIAGNOSIS — Z113 Encounter for screening for infections with a predominantly sexual mode of transmission: Secondary | ICD-10-CM

## 2021-03-15 DIAGNOSIS — E1165 Type 2 diabetes mellitus with hyperglycemia: Secondary | ICD-10-CM

## 2021-03-15 DIAGNOSIS — I10 Essential (primary) hypertension: Secondary | ICD-10-CM

## 2021-03-15 DIAGNOSIS — B20 Human immunodeficiency virus [HIV] disease: Secondary | ICD-10-CM

## 2021-03-15 DIAGNOSIS — Z0000 Encounter for general adult medical examination without abnormal findings: Secondary | ICD-10-CM

## 2021-03-15 MED ORDER — LISINOPRIL 10 MG PO TABS: 10.0000 mg | ORAL_TABLET | Freq: Every day | ORAL | 3 refills | Status: DC

## 2021-03-15 MED ORDER — BIKTARVY 50-200-25 MG PO TABS: 1.0000 | ORAL_TABLET | Freq: Every day | ORAL | 3 refills | Status: DC

## 2021-03-15 MED ORDER — METFORMIN HCL 500 MG PO TABS: ORAL_TABLET | ORAL | 3 refills | Status: DC

## 2021-03-15 MED ORDER — CANAGLIFLOZIN 100 MG PO TABS
100.0000 mg | ORAL_TABLET | Freq: Every day | ORAL | 3 refills | Status: DC
Start: 2021-03-15 — End: 2021-06-30

## 2021-03-15 NOTE — Patient Instructions (Signed)
Nice to see you.  We will check your lab work today.  Continue to take your medication daily as prescribed.  Refills have been sent to the pharmacy.  Plan for follow up in 3 month or sooner if needed with lab work on the same day.  Have a great day and stay safe!  

## 2021-03-15 NOTE — Assessment & Plan Note (Signed)
·   Discussed importance of safe sexual practice and condom use.  Condoms offered.  Declines vaccines.  Declines STD testing.

## 2021-03-15 NOTE — Assessment & Plan Note (Signed)
Mr. Eliot continues to have poorly controlled diabetes with less than optimal adherence and good tolerance to his medication regimen of metformin and Invokana.  Discussed importance of medications daily to lower blood sugars as his last hemoglobin A1c is 12.4.  Discussed the complications and potential associated risks of continued hyperglycemia.  Discussed/educated regarding nutritional intake and reduction of processed/sugary foods and drinks.  Continue current dose of metformin.  Start Invokana at 100 mg daily.  Check hemoglobin A1c.  Due for diabetic eye exam.  A1c does not improve may need referral to primary care and/or endocrinology.  Plan for follow-up in 3 months or sooner if needed.

## 2021-03-15 NOTE — Assessment & Plan Note (Signed)
Alan Boyle has well-controlled virus with good adherence and tolerance to his ART regimen of Biktarvy.  No signs/symptoms of opportunistic infection.  We reviewed previous lab work and discussed plan of care.  Check blood work today.  Continue current dose of Biktarvy.  Renew financial assistance.  Plan for follow-up in 3 months or sooner if needed with lab work on the same day.Marland Kitchen

## 2021-03-15 NOTE — Progress Notes (Signed)
Brief Narrative   Patient ID: Alan Boyle, male    DOB: May 22, 1980, 41 y.o.   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  Initial viral load and CD4 count are unavailable.  Most recent genotype from 3/21 with no significant medication resistance mutations. [id number] negative. No history of opportunistic infection. Previous ART history of a Atripla.   Subjective:    Chief Complaint  Patient presents with   Follow-up    b20    HPI:  Alan Boyle is a 41 y.o. male with HIV disease, type 2 diabetes, and hypertension last seen on 12/14/2020 with well-controlled virus and good adherence and tolerance to his ART regimen of Biktarvy.  Viral load was undetectable with CD4 count of 299.  Diabetes was poorly controlled with less than optimal adherence to his regimen of Invokana and metformin with hemoglobin A1c of 12.4.  Here today for routine follow-up.  Alan Boyle continues to take his Biktarvy and metformin daily as prescribed with no adverse side effects.  Not currently taking Invokana.  Has been having increased frequency of urination, thirst, fatigue, and hunger.  Drinks significant amount of apple juice and soda. Denies fevers, chills, night sweats, headaches, changes in vision, neck pain/stiffness, nausea, diarrhea, vomiting, lesions or rashes.  Alan Boyle has no problems obtaining medication from the pharmacy and remains covered by [employer].  Denies feelings of being down, depressed, or hopeless recently.  No current recreational or illicit drug use, alcohol consumption, and occasional cigar.  Condoms offered.  Healthcare maintenance due includes influenza vaccine and Prevnar.  No Known Allergies    Outpatient Medications Prior to Visit  Medication Sig Dispense Refill   bictegravir-emtricitabine-tenofovir AF (BIKTARVY) 50-200-25 MG TABS tablet Take 1 tablet by mouth daily. 30 tablet 3   canagliflozin (INVOKANA) 300 MG  TABS tablet Take 1 tablet (300 mg total) by mouth daily before breakfast. 30 tablet 3   lisinopril (ZESTRIL) 10 MG tablet Take 1 tablet (10 mg total) by mouth daily. 30 tablet 3   metFORMIN (GLUCOPHAGE) 500 MG tablet TAKE 1 TABLET(500 MG) BY MOUTH TWICE DAILY WITH A MEAL 60 tablet 3   No facility-administered medications prior to visit.     Past Medical History:  Diagnosis Date   Diabetes mellitus    NO MEDS (FINANCIAL ISSUE)   HIV disease ([place])    Hypertension    NO MEDS (FINANCIAL ISSUE)   Rectal mass    Rectal pain    Syphilis      Past Surgical History:  Procedure Laterality Date   TUMOR EXCISION N/A 12/16/2012   Procedure: EXAM UNDER ANESTHESIA AND REMOVAL OF RECTAL MASS;  Surgeon: Edward Jolly, MD;  Location: [facility];  Service: General;  Laterality: N/A;      Review of Systems  Constitutional:  Positive for fatigue. Negative for appetite change, chills, fever and unexpected weight change.  Eyes:  Negative for visual disturbance.       Negative for changes in vision.  Respiratory:  Negative for cough, chest tightness, shortness of breath and wheezing.   Cardiovascular:  Negative for chest pain, palpitations and leg swelling.  Gastrointestinal:  Negative for abdominal pain, constipation, diarrhea, nausea and vomiting.  Endocrine: Positive for polydipsia and polyphagia. Negative for polyuria.  Genitourinary:  Negative for dysuria, flank pain, frequency, genital sores, hematuria and urgency.  Skin:  Negative for rash.  Allergic/Immunologic: Negative for immunocompromised state.  Neurological:  Negative for dizziness, weakness, light-headedness and headaches.     Objective:    BP (!) 169/114    Pulse 85    Temp 97.9 F (36.6 C) (Temporal)    Wt (!) 302 lb (137 kg)    SpO2 99%    BMI 38.77 kg/m  Nursing note and vital signs reviewed.  Physical Exam Constitutional:      General: He is not in acute distress.    Appearance: He is  well-developed.  Eyes:     Conjunctiva/sclera: Conjunctivae normal.  Cardiovascular:     Rate and Rhythm: Normal rate and regular rhythm.     Heart sounds: Normal heart sounds. No murmur heard.   No friction rub. No gallop.  Pulmonary:     Effort: Pulmonary effort is normal. No respiratory distress.     Breath sounds: Normal breath sounds. No wheezing or rales.  Chest:     Chest wall: No tenderness.  Abdominal:     General: Bowel sounds are normal.     Palpations: Abdomen is soft.     Tenderness: There is no abdominal tenderness.  Musculoskeletal:     Cervical back: Neck supple.  Lymphadenopathy:     Cervical: No cervical adenopathy.  Skin:    General: Skin is warm and dry.     Findings: No rash.  Neurological:     Mental Status: He is alert and oriented to person, place, and time.  Psychiatric:        Behavior: Behavior normal.        Thought Content: Thought content normal.        Judgment: Judgment normal.     Depression screen [facility] 2/9 03/15/2021 12/14/2020 06/29/2020 03/10/2020 09/29/2019  Decreased Interest 0 0 0 0 0  Down, Depressed, Hopeless 0 0 0 0 0  PHQ - 2 Score 0 0 0 0 0       Assessment & Plan:    Patient Active Problem List   Diagnosis Date Noted   Healthcare maintenance 07/11/2019   Oral candidiasis 06/10/2019   Rectal lesion 06/10/2019   Tobacco use disorder 09/26/2013   HIV disease ([place])    Obese 08/15/2011   SYPHILIS 03/29/2010   AIDS (acquired immune deficiency syndrome) ([place]) 05/06/2009   Type 2 diabetes mellitus with hyperglycemia ([place]) 05/06/2009   DEPRESSION 05/06/2009   Essential hypertension 05/06/2009     Problem List Items Addressed This Visit       Cardiovascular and Mediastinum   Essential hypertension    Hypertension is above goal 140/90 with current medication regimen of lisinopril.  Denies any headaches and/or changes in vision.  Suspect this is multifactorial given increased weight as well as poorly controlled diabetes and HIV.   He will work on nutritional adjustments first before adjustments in medication.  If blood pressure remains elevated will consider additional medications or titrating lisinopril dose.      Relevant Medications   lisinopril (ZESTRIL) 10 MG tablet     Endocrine   Type 2 diabetes mellitus with hyperglycemia [facility])    Alan Boyle continues to have poorly controlled diabetes with less than optimal adherence and good tolerance to his medication regimen of metformin and Invokana.  Discussed importance of medications daily to lower blood sugars as his last hemoglobin A1c is 12.4.  Discussed the complications and potential associated risks of continued hyperglycemia.  Discussed/educated regarding nutritional intake and reduction of processed/sugary foods and drinks.  Continue current dose of metformin.  Start Invokana at  100 mg daily.  Check hemoglobin A1c.  Due for diabetic eye exam.  A1c does not improve may need referral to primary care and/or endocrinology.  Plan for follow-up in 3 months or sooner if needed.      Relevant Medications   canagliflozin (INVOKANA) 100 MG TABS tablet   lisinopril (ZESTRIL) 10 MG tablet   metFORMIN (GLUCOPHAGE) 500 MG tablet   Other Relevant Orders   HgB A1c     Other   HIV disease ([place])    Alan Boyle has well-controlled virus with good adherence and tolerance to his ART regimen of Biktarvy.  No signs/symptoms of opportunistic infection.  We reviewed previous lab work and discussed plan of care.  Check blood work today.  Continue current dose of Biktarvy.  Renew financial assistance.  Plan for follow-up in 3 months or sooner if needed with lab work on the same day..      Relevant Medications   bictegravir-emtricitabine-tenofovir AF (BIKTARVY) 50-200-25 MG TABS tablet   Other Relevant Orders   COMPLETE METABOLIC PANEL WITH GFR   HIV-1 RNA quant-no reflex-bld   T-helper cell (CD4)- ([facility] clinic only)   Healthcare maintenance    Discussed importance of safe sexual  practice and condom use.  Condoms offered. Declines vaccines. Declines STD testing.      Other Visit Diagnoses     Screening for STDs (sexually transmitted diseases)    -  Primary   Relevant Orders   RPR        I have discontinued Gust Rung. Boyle's canagliflozin. I have also changed his metFORMIN. Additionally, I am having him start on canagliflozin. Lastly, I am having him maintain his Biktarvy and lisinopril.   Meds ordered this encounter  Medications   canagliflozin (INVOKANA) 100 MG TABS tablet    Sig: Take 1 tablet (100 mg total) by mouth daily before breakfast.    Dispense:  30 tablet    Refill:  3    Order Specific Question:   Supervising Provider    Answer:   Baxter Flattery, CYNTHIA [[id number]]   bictegravir-emtricitabine-tenofovir AF (BIKTARVY) 50-200-25 MG TABS tablet    Sig: Take 1 tablet by mouth daily.    Dispense:  30 tablet    Refill:  3    [employer] renewal pending    Order Specific Question:   Supervising Provider    Answer:   Baxter Flattery, CYNTHIA [[id number]]   lisinopril (ZESTRIL) 10 MG tablet    Sig: Take 1 tablet (10 mg total) by mouth daily.    Dispense:  30 tablet    Refill:  3    Order Specific Question:   Supervising Provider    Answer:   Baxter Flattery, CYNTHIA [id number]   metFORMIN (GLUCOPHAGE) 500 MG tablet    Sig: Take 1 tablet by mouth twice daily with a meal    Dispense:  60 tablet    Refill:  3    Order Specific Question:   Supervising Provider    Answer:   Carlyle Basques [[id number]]     Follow-up: Return in about 3 months (around 06/13/2021), or if symptoms worsen or fail to improve.   Terri Piedra, MSN, FNP-C Nurse Practitioner [facility] for Infectious Disease [place] number: [phone]

## 2021-03-15 NOTE — Assessment & Plan Note (Signed)
Hypertension is above goal 140/90 with current medication regimen of lisinopril.  Denies any headaches and/or changes in vision.  Suspect this is multifactorial given increased weight as well as poorly controlled diabetes and HIV.  He will work on nutritional adjustments first before adjustments in medication.  If blood pressure remains elevated will consider additional medications or titrating lisinopril dose.

## 2021-03-16 LAB — T-HELPER CELL (CD4) - (RCID CLINIC ONLY)
CD4 % Helper T Cell: 21 % — ABNORMAL LOW (ref 33–65)
CD4 T Cell Abs: 328 /uL — ABNORMAL LOW (ref 400–1790)

## 2021-03-17 LAB — COMPLETE METABOLIC PANEL WITH GFR
AG Ratio: 1.2 (calc) (ref 1.0–2.5)
ALT: 11 U/L (ref 9–46)
AST: 11 U/L (ref 10–40)
Albumin: 4 g/dL (ref 3.6–5.1)
Alkaline phosphatase (APISO): 71 U/L (ref 36–130)
BUN: 9 mg/dL (ref 7–25)
CO2: 30 mmol/L (ref 20–32)
Calcium: 9.3 mg/dL (ref 8.6–10.3)
Chloride: 98 mmol/L (ref 98–110)
Creat: 1.01 mg/dL (ref 0.60–1.29)
Globulin: 3.4 g/dL (calc) (ref 1.9–3.7)
Glucose, Bld: 364 mg/dL — ABNORMAL HIGH (ref 65–99)
Potassium: 4.1 mmol/L (ref 3.5–5.3)
Sodium: 136 mmol/L (ref 135–146)
Total Bilirubin: 0.3 mg/dL (ref 0.2–1.2)
Total Protein: 7.4 g/dL (ref 6.1–8.1)
eGFR: 95 mL/min/{1.73_m2} (ref >=60)

## 2021-03-17 LAB — RPR: RPR Ser Ql: REACTIVE — AB

## 2021-03-17 LAB — FLUORESCENT TREPONEMAL AB(FTA)-IGG-BLD: Fluorescent Treponemal ABS: REACTIVE — AB

## 2021-03-17 LAB — HIV-1 RNA QUANT-NO REFLEX-BLD
HIV 1 RNA Quant: 29 Copies/mL — ABNORMAL HIGH
HIV-1 RNA Quant, Log: 1.47 Log cps/mL — ABNORMAL HIGH

## 2021-03-17 LAB — HEMOGLOBIN A1C
Hgb A1c MFr Bld: 13.2 % of total Hgb — ABNORMAL HIGH (ref <5.7)
Mean Plasma Glucose: 332 mg/dL
eAG (mmol/L): 18.4 mmol/L

## 2021-03-17 LAB — RPR TITER: RPR Titer: 1:4 {titer} — ABNORMAL HIGH

## 2021-06-27 ENCOUNTER — Ambulatory Visit: Payer: Self-pay | Admitting: Family

## 2021-06-27 ENCOUNTER — Other Ambulatory Visit: Payer: Self-pay

## 2021-06-30 ENCOUNTER — Encounter: Payer: Self-pay | Admitting: Family

## 2021-06-30 ENCOUNTER — Other Ambulatory Visit: Payer: Self-pay

## 2021-06-30 ENCOUNTER — Ambulatory Visit (INDEPENDENT_AMBULATORY_CARE_PROVIDER_SITE_OTHER): Payer: Self-pay | Admitting: Family

## 2021-06-30 VITALS — BP 151/95 | HR 94 | Temp 96.4°F | Wt 290.0 lb

## 2021-06-30 DIAGNOSIS — E1165 Type 2 diabetes mellitus with hyperglycemia: Secondary | ICD-10-CM

## 2021-06-30 DIAGNOSIS — I10 Essential (primary) hypertension: Secondary | ICD-10-CM

## 2021-06-30 DIAGNOSIS — Z0000 Encounter for general adult medical examination without abnormal findings: Secondary | ICD-10-CM

## 2021-06-30 DIAGNOSIS — Z113 Encounter for screening for infections with a predominantly sexual mode of transmission: Secondary | ICD-10-CM

## 2021-06-30 DIAGNOSIS — B20 Human immunodeficiency virus [HIV] disease: Secondary | ICD-10-CM

## 2021-06-30 MED ORDER — METFORMIN HCL 500 MG PO TABS: ORAL_TABLET | ORAL | 3 refills | Status: DC

## 2021-06-30 MED ORDER — BIKTARVY 50-200-25 MG PO TABS: 1.0000 | ORAL_TABLET | Freq: Every day | ORAL | 3 refills | Status: DC

## 2021-06-30 MED ORDER — LISINOPRIL 10 MG PO TABS: 10.0000 mg | ORAL_TABLET | Freq: Every day | ORAL | 3 refills | Status: DC

## 2021-06-30 MED ORDER — CANAGLIFLOZIN 100 MG PO TABS: 100.0000 mg | ORAL_TABLET | Freq: Every day | ORAL | 3 refills | Status: DC

## 2021-06-30 NOTE — Assessment & Plan Note (Signed)
Blood pressure mildly elevated above goal with current dose of lisinopril.  No red flag/warning signs/symptoms concerning for complications.  Discussed importance of nutritional intake and physical activity.  Monitor blood pressure at home as able.  If blood pressure remains elevated will increase lisinopril. ?

## 2021-06-30 NOTE — Assessment & Plan Note (Signed)
Alan Boyle appears to have improvements in his diabetes care and good adherence and tolerance to his regimen of metformin and Invokana.  Has noted decreases in polyuria since last office visit.  Discussed importance of continue take medications daily as prescribed.  Due for diabetic eye exam.  Check hemoglobin A1c.  Continue current dose of Invokana and metformin. ?

## 2021-06-30 NOTE — Assessment & Plan Note (Signed)
Alan Boyle continues to have well-controlled virus with good adherence and tolerance to his ART regimen of Biktarvy.  No signs/symptoms of opportunistic infection.  We reviewed previous lab work and discussed plan of care.  Check blood work today.  Continue current dose of Biktarvy.  Plan for follow-up in 3 months or sooner if needed with lab work on the same day. ?

## 2021-06-30 NOTE — Progress Notes (Signed)
? ? ?Brief Narrative  ? ?Patient ID: Alan Boyle, male    DOB: 08/27/1980, 41 y.o.   MRN: [id number] ? ?Alan Boyle is a 41 year old African-American male with HIV disease with risk factor of MSM.  Initial viral load and CD4 count are unavailable.  Most recent genotype from 3/21 with no significant medication resistance mutations. [id number] negative. No history of opportunistic infection. Previous ART history of a Atripla.  ? ?Subjective:  ?  ?Chief Complaint  ?Patient presents with  ? Follow-up  ? ? ?HPI: ? ?Alan Boyle is a 41 y.o. male with HIV disease, hypertension and Type 2 diabetes last seen on 03/15/21 with well controlled HIV disease and poorly controlled diabetes. Viral load was undetectable with CD4 count of 328. Hemoglobin A1c of 13.2 with hyperglycemia with blood sugar of 364. Kidney function, liver function, and electrolytes within normal ranges. Here today for follow up.  ? ?Alan Boyle has been taking his Biktarvy, Invokana, and metformin daily as prescribed with no significant adverse side effects.  Polyuria has decreased.  Overall feeling well today with no new concerns/complaints. Denies fevers, chills, night sweats, headaches, changes in vision, neck pain/stiffness, nausea, diarrhea, vomiting, lesions or rashes. ? ?Alan Boyle has no problems obtaining medication from the pharmacy.  Denies feelings of being down, depressed, or hopeless recently.  No current recreational illicit drug use, alcohol consumption and occasional cigar use.  Condoms offered.  Healthcare maintenance due includes Prevnar.  Recently lost his job and currently seeking additional employment. ? ? ?No Known Allergies ? ? ? ?Outpatient Medications Prior to Visit  ?Medication Sig Dispense Refill  ? bictegravir-emtricitabine-tenofovir AF (BIKTARVY) 50-200-25 MG TABS tablet Take 1 tablet by mouth daily. 30 tablet 3  ? canagliflozin (INVOKANA) 100 MG TABS tablet Take 1 tablet (100 mg total) by mouth daily before breakfast. 30  tablet 3  ? lisinopril (ZESTRIL) 10 MG tablet Take 1 tablet (10 mg total) by mouth daily. 30 tablet 3  ? metFORMIN (GLUCOPHAGE) 500 MG tablet Take 1 tablet by mouth twice daily with a meal 60 tablet 3  ? ?No facility-administered medications prior to visit.  ? ? ? ?Past Medical History:  ?Diagnosis Date  ? Diabetes mellitus   ? NO MEDS (FINANCIAL ISSUE)  ? HIV disease ([place])   ? Hypertension   ? NO MEDS (FINANCIAL ISSUE)  ? Rectal mass   ? Rectal pain   ? Syphilis   ? ? ? ?Past Surgical History:  ?Procedure Laterality Date  ? TUMOR EXCISION N/A 12/16/2012  ? Procedure: EXAM UNDER ANESTHESIA AND REMOVAL OF RECTAL MASS;  Surgeon: Edward Jolly, MD;  Location: [facility];  Service: General;  Laterality: N/A;  ? ? ? ? ?Review of Systems  ?Constitutional:  Negative for appetite change, chills, fatigue, fever and unexpected weight change.  ?Eyes:  Negative for visual disturbance.  ?Respiratory:  Negative for cough, chest tightness, shortness of breath and wheezing.   ?Cardiovascular:  Negative for chest pain and leg swelling.  ?Gastrointestinal:  Negative for abdominal pain, constipation, diarrhea, nausea and vomiting.  ?Genitourinary:  Negative for dysuria, flank pain, frequency, genital sores, hematuria and urgency.  ?Skin:  Negative for rash.  ?Allergic/Immunologic: Negative for immunocompromised state.  ?Neurological:  Negative for dizziness and headaches.  ?   ?Objective:  ?  ?BP (!) 151/95   Pulse 94   Temp (!) 96.4 ?F (35.8 ?C) (Temporal)   Wt 290 lb (131.5 kg)   SpO2 97%   BMI  37.23 kg/m?  ?Nursing note and vital signs reviewed. ? ?Physical Exam ?Constitutional:   ?   General: He is not in acute distress. ?   Appearance: He is well-developed.  ?Eyes:  ?   Conjunctiva/sclera: Conjunctivae normal.  ?Cardiovascular:  ?   Rate and Rhythm: Normal rate and regular rhythm.  ?   Heart sounds: Normal heart sounds. No murmur heard. ?  No friction rub. No gallop.  ?Pulmonary:  ?   Effort: Pulmonary  effort is normal. No respiratory distress.  ?   Breath sounds: Normal breath sounds. No wheezing or rales.  ?Chest:  ?   Chest wall: No tenderness.  ?Abdominal:  ?   General: Bowel sounds are normal.  ?   Palpations: Abdomen is soft.  ?   Tenderness: There is no abdominal tenderness.  ?Musculoskeletal:  ?   Cervical back: Neck supple.  ?Lymphadenopathy:  ?   Cervical: No cervical adenopathy.  ?Skin: ?   General: Skin is warm and dry.  ?   Findings: No rash.  ?Neurological:  ?   Mental Status: He is alert and oriented to person, place, and time.  ?Psychiatric:     ?   Behavior: Behavior normal.     ?   Thought Content: Thought content normal.     ?   Judgment: Judgment normal.  ? ? ? ? ?  06/30/2021  ? 10:49 AM 03/15/2021  ? 10:16 AM 12/14/2020  ? 10:24 AM 06/29/2020  ?  2:46 PM 03/10/2020  ?  2:58 PM  ?Depression screen PHQ 2/9  ?Decreased Interest 0 0 0 0 0  ?Down, Depressed, Hopeless 0 0 0 0 0  ?PHQ - 2 Score 0 0 0 0 0  ?  ?   ?Assessment & Plan:  ? ? ?Patient Active Problem List  ? Diagnosis Date Noted  ? Healthcare maintenance 07/11/2019  ? Oral candidiasis 06/10/2019  ? Rectal lesion 06/10/2019  ? Tobacco use disorder 09/26/2013  ? HIV disease ([place])   ? Obese 08/15/2011  ? SYPHILIS 03/29/2010  ? AIDS (acquired immune deficiency syndrome) ([place]) 05/06/2009  ? Type 2 diabetes mellitus with hyperglycemia ([place]) 05/06/2009  ? DEPRESSION 05/06/2009  ? Essential hypertension 05/06/2009  ? ? ? ?Problem List Items Addressed This Visit   ? ?  ? Cardiovascular and Mediastinum  ? Essential hypertension  ?  Blood pressure mildly elevated above goal with current dose of lisinopril.  No red flag/warning signs/symptoms concerning for complications.  Discussed importance of nutritional intake and physical activity.  Monitor blood pressure at home as able.  If blood pressure remains elevated will increase lisinopril. ? ?  ?  ? Relevant Medications  ? lisinopril (ZESTRIL) 10 MG tablet  ?  ? Endocrine  ? Type 2 diabetes mellitus with  hyperglycemia (HCC)  ?  Alan Boyle appears to have improvements in his diabetes care and good adherence and tolerance to his regimen of metformin and Invokana.  Has noted decreases in polyuria since last office visit.  Discussed importance of continue take medications daily as prescribed.  Due for diabetic eye exam.  Check hemoglobin A1c.  Continue current dose of Invokana and metformin. ? ?  ?  ? Relevant Medications  ? canagliflozin (INVOKANA) 100 MG TABS tablet  ? lisinopril (ZESTRIL) 10 MG tablet  ? metFORMIN (GLUCOPHAGE) 500 MG tablet  ? Other Relevant Orders  ? HgB A1c  ?  ? Other  ? HIV disease ([place]) - Primary  ?  Mr.  Brouillet continues to have well-controlled virus with good adherence and tolerance to his ART regimen of Biktarvy.  No signs/symptoms of opportunistic infection.  We reviewed previous lab work and discussed plan of care.  Check blood work today.  Continue current dose of Biktarvy.  Plan for follow-up in 3 months or sooner if needed with lab work on the same day. ? ?  ?  ? Relevant Medications  ? bictegravir-emtricitabine-tenofovir AF (BIKTARVY) 50-200-25 MG TABS tablet  ? Other Relevant Orders  ? Comprehensive metabolic panel  ? HIV-1 RNA quant-no reflex-bld  ? T-helper cell (CD4)- ([facility] clinic only)  ? Healthcare maintenance  ?  Discussed importance of safe sexual practices and condom use.  Condoms offered. ?Declines vaccinations. ?Due for diabetic eye exam and will discuss with [facility]. ?  ?  ? ?Other Visit Diagnoses   ? ? Screening for STDs (sexually transmitted diseases)      ? Relevant Orders  ? RPR  ? ?  ? ? ? ?I am having Alan Boyle maintain his Biktarvy, canagliflozin, lisinopril, and metFORMIN. ? ? ?Meds ordered this encounter  ?Medications  ? bictegravir-emtricitabine-tenofovir AF (BIKTARVY) 50-200-25 MG TABS tablet  ?  Sig: Take 1 tablet by mouth daily.  ?  Dispense:  30 tablet  ?  Refill:  3  ?  UMAP renewal pending  ?  Order Specific Question:   Supervising  Provider  ?  Answer:   Carlyle Basques [[id number]]  ? canagliflozin (INVOKANA) 100 MG TABS tablet  ?  Sig: Take 1 tablet (100 mg total) by mouth daily before breakfast.  ?  Dispense:  30 tablet  ?  Refill:  3  ?  Order Spec

## 2021-06-30 NOTE — Assessment & Plan Note (Signed)
?   Discussed importance of safe sexual practices and condom use.  Condoms offered. ?? Declines vaccinations. ?? Due for diabetic eye exam and will discuss with [facility]. ?

## 2021-06-30 NOTE — Patient Instructions (Signed)
Nice to see you.  We will check your lab work today.  Continue to take your medication daily as prescribed.  Refills have been sent to the pharmacy.  Plan for follow up in 3 months or sooner if needed with lab work on the same day.  Have a great day and stay safe!  

## 2021-07-02 LAB — COMPREHENSIVE METABOLIC PANEL
AG Ratio: 1.3 (calc) (ref 1.0–2.5)
ALT: 12 U/L (ref 9–46)
AST: 8 U/L — ABNORMAL LOW (ref 10–40)
Albumin: 3.9 g/dL (ref 3.6–5.1)
Alkaline phosphatase (APISO): 75 U/L (ref 36–130)
BUN: 9 mg/dL (ref 7–25)
CO2: 25 mmol/L (ref 20–32)
Calcium: 9 mg/dL (ref 8.6–10.3)
Chloride: 100 mmol/L (ref 98–110)
Creat: 1.01 mg/dL (ref 0.60–1.29)
Globulin: 3.1 g/dL (calc) (ref 1.9–3.7)
Glucose, Bld: 332 mg/dL — ABNORMAL HIGH (ref 65–99)
Potassium: 3.7 mmol/L (ref 3.5–5.3)
Sodium: 136 mmol/L (ref 135–146)
Total Bilirubin: 0.5 mg/dL (ref 0.2–1.2)
Total Protein: 7 g/dL (ref 6.1–8.1)

## 2021-07-02 LAB — HIV-1 RNA QUANT-NO REFLEX-BLD
HIV 1 RNA Quant: <20 copies/mL — AB
HIV-1 RNA Quant, Log: <1.3 Log copies/mL — AB

## 2021-07-02 LAB — FLUORESCENT TREPONEMAL AB(FTA)-IGG-BLD: Fluorescent Treponemal ABS: REACTIVE — AB

## 2021-07-02 LAB — RPR: RPR Ser Ql: REACTIVE — AB

## 2021-07-02 LAB — HEMOGLOBIN A1C
Hgb A1c MFr Bld: 10.4 % of total Hgb — ABNORMAL HIGH (ref <5.7)
Mean Plasma Glucose: 252 mg/dL
eAG (mmol/L): 13.9 mmol/L

## 2021-07-02 LAB — RPR TITER: RPR Titer: 1:4 {titer} — ABNORMAL HIGH

## 2021-09-29 ENCOUNTER — Ambulatory Visit: Payer: Self-pay | Admitting: Family

## 2021-10-03 ENCOUNTER — Ambulatory Visit: Payer: Self-pay

## 2021-10-03 ENCOUNTER — Other Ambulatory Visit: Payer: Self-pay

## 2021-10-03 ENCOUNTER — Ambulatory Visit (INDEPENDENT_AMBULATORY_CARE_PROVIDER_SITE_OTHER): Payer: Self-pay | Admitting: Family

## 2021-10-03 ENCOUNTER — Encounter: Payer: Self-pay | Admitting: Family

## 2021-10-03 VITALS — BP 161/104 | HR 84 | Temp 98.2°F | Wt 290.0 lb

## 2021-10-03 DIAGNOSIS — Z6837 Body mass index (BMI) 37.0-37.9, adult: Secondary | ICD-10-CM

## 2021-10-03 DIAGNOSIS — A539 Syphilis, unspecified: Secondary | ICD-10-CM

## 2021-10-03 DIAGNOSIS — I10 Essential (primary) hypertension: Secondary | ICD-10-CM

## 2021-10-03 DIAGNOSIS — Z113 Encounter for screening for infections with a predominantly sexual mode of transmission: Secondary | ICD-10-CM

## 2021-10-03 DIAGNOSIS — E1165 Type 2 diabetes mellitus with hyperglycemia: Secondary | ICD-10-CM

## 2021-10-03 DIAGNOSIS — B20 Human immunodeficiency virus [HIV] disease: Secondary | ICD-10-CM

## 2021-10-03 DIAGNOSIS — Z0000 Encounter for general adult medical examination without abnormal findings: Secondary | ICD-10-CM

## 2021-10-03 MED ORDER — CANAGLIFLOZIN 100 MG PO TABS: 100.0000 mg | ORAL_TABLET | Freq: Every day | ORAL | 3 refills | Status: DC

## 2021-10-03 MED ORDER — METFORMIN HCL 500 MG PO TABS: ORAL_TABLET | ORAL | 3 refills | Status: DC

## 2021-10-03 MED ORDER — LISINOPRIL 20 MG PO TABS: 20.0000 mg | ORAL_TABLET | Freq: Every day | ORAL | 3 refills | Status: DC

## 2021-10-03 MED ORDER — BIKTARVY 50-200-25 MG PO TABS: 1.0000 | ORAL_TABLET | Freq: Every day | ORAL | 3 refills | Status: DC

## 2021-10-03 NOTE — Assessment & Plan Note (Signed)
Alan Boyle continues to have well controlled virus with good adherence and tolerance to his ART regimen of Biktarvy. Reviewed previous lab work and discussed plan of care. Check lab work today. Continue current dose of Biktarvy. Renew financial assistance. Plan for follow up in 3 months or sooner if needed with lab work on the same day.

## 2021-10-03 NOTE — Assessment & Plan Note (Signed)
Alan Boyle weight has been stable to slightly decreased over the past 6 months despite less than optimal nutrition and physical activity. Working on starting [name] which will likely come with weight loss. Encouraged improving nutritional intake and physical activity.   Wt Readings from Last 3 Encounters:  10/03/21 290 lb (131.5 kg)  06/30/21 290 lb (131.5 kg)  03/15/21 (!) 302 lb (137 kg)

## 2021-10-03 NOTE — Assessment & Plan Note (Addendum)
Alan Boyle A1c was recently improved and given his less than optimal metformin adherence and return of polyuria would anticipate worsening of his A1c. Will check A1c today. Discussed importance of taking medication as prescribed. Unfortunately UMAP does not cover extended release metformin. Will continue metformin and Invokana and working to add [name] with application started. Discussed importance of improving nutritional intake. Plan for follow up in 3 months or sooner if needed.

## 2021-10-03 NOTE — Assessment & Plan Note (Signed)
   Discussed importance of safe sexual practice and condom use. Condoms offered.   Vaccines declined.  Requested STD testing.   Due for routine dental care with referral placed to [facility]

## 2021-10-03 NOTE — Patient Instructions (Signed)
Nice to see you.  We will check your lab work today.  Continue to take your medication daily as prescribed.  Refills have been sent to the pharmacy.  Lisinopril is now 20 mg. Take 2 tablets of 10mg  until you run out.  Remember second dose of metformin.   Plan for follow up in 3 months or sooner if needed with lab work on the same day.  Have a great day and stay safe!

## 2021-10-03 NOTE — Assessment & Plan Note (Signed)
Syphilis titer stable and down from previous treatment level of 1:64. Recheck RPR.

## 2021-10-03 NOTE — Progress Notes (Signed)
Brief Narrative   Patient ID: Alan Boyle, male    DOB: October 29, 1980, 41 y.o.   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  Initial viral load and CD4 count are unavailable.  Most recent genotype from 3/21 with no significant medication resistance mutations. [id number] negative. No history of opportunistic infection. Previous ART history of a Atripla.   Subjective:    Chief Complaint  Patient presents with   Follow-up    B20 - patient requesting STI testing - patient reports white discharge on rectum x 3 weeks.     HPI:  Alan Boyle is a 41 y.o. male with HIV disease, type 2 diabetes, and essential hypertension last seen on 06/30/2021.  Here today for routine follow-up.  1.  HIV disease -previously well controlled with good adherence and tolerance to his ART regimen of Biktarvy.  Viral load was undetectable with CD4 count of 328.  Kidney function, liver function, electrolytes within normal ranges.  RPR was serofast at 1: 4 down from treatment level 1: 64. Continues to take his Biktarvy daily as prescribed with no adverse side effects. Feeling well today with concern for possible STI with occasional dysuria and white discharge on occasion. Denies fevers, chills, night sweats, headaches, changes in vision, neck pain/stiffness, nausea, diarrhea, vomiting, lesions or rashes. No problems obtaining medication from the pharmacy. Condoms offered.    2.  Type 2 diabetes -previously had improvements with polyuria with good adherence and tolerance to his regimen of Invokana and metformin. Previous blood sugar was poorly controlled at 332 with hemoglobin A1c of 10.4 which was down from 13.2. Has been taking the Invokana daily as prescribed and forgetting about the second dose of metformin. Has had increased polyuria recently. No numbness or tingling. Has been bad with his eating habits.   Lab Results  Component Value Date    HGBA1C 10.4 (H) 06/30/2021    3.) Hypertension - Blood pressure at previous office visit 151/95 which was improved from 169/114 with good adherence and tolerance to his regimen of lisinopril. Renal function stable.  Has been taking his lisinopril daily as prescribed with no adverse side. No changes in vision and denies headaches. Has been bad with his eating habits.   BP Readings from Last 3 Encounters:  10/03/21 (!) 161/104  06/30/21 (!) 151/95  03/15/21 (!) 169/114     No Known Allergies    Outpatient Medications Prior to Visit  Medication Sig Dispense Refill   bictegravir-emtricitabine-tenofovir AF (BIKTARVY) 50-200-25 MG TABS tablet Take 1 tablet by mouth daily. 30 tablet 3   canagliflozin (INVOKANA) 100 MG TABS tablet Take 1 tablet (100 mg total) by mouth daily before breakfast. 30 tablet 3   lisinopril (ZESTRIL) 10 MG tablet Take 1 tablet (10 mg total) by mouth daily. 30 tablet 3   metFORMIN (GLUCOPHAGE) 500 MG tablet Take 1 tablet by mouth twice daily with a meal 60 tablet 3   No facility-administered medications prior to visit.     Past Medical History:  Diagnosis Date   Diabetes mellitus    NO MEDS (FINANCIAL ISSUE)   HIV disease (HCC)    Hypertension    NO MEDS (FINANCIAL ISSUE)   Rectal mass    Rectal pain    Syphilis      Past Surgical History:  Procedure Laterality Date   TUMOR EXCISION N/A 12/16/2012   Procedure: EXAM UNDER ANESTHESIA AND REMOVAL OF RECTAL MASS;  Surgeon: Mariella Saa, MD;  Location: [facility];  Service: General;  Laterality: N/A;      Review of Systems  Constitutional:  Negative for appetite change, chills, fatigue, fever and unexpected weight change.  Eyes:  Negative for visual disturbance.       Negative for changes in vision.  Respiratory:  Negative for cough, chest tightness, shortness of breath and wheezing.   Cardiovascular:  Negative for chest pain, palpitations and leg swelling.  Gastrointestinal:   Negative for abdominal pain, constipation, diarrhea, nausea and vomiting.  Endocrine: Positive for polyuria. Negative for polydipsia and polyphagia.  Genitourinary:  Negative for dysuria, flank pain, frequency, genital sores, hematuria and urgency.  Skin:  Negative for rash.  Allergic/Immunologic: Negative for immunocompromised state.  Neurological:  Negative for dizziness, weakness, light-headedness and headaches.      Objective:    BP (!) 161/104   Pulse 84   Temp 98.2 F (36.8 C) (Oral)   Wt 290 lb (131.5 kg)   SpO2 99%   BMI 37.23 kg/m  Nursing note and vital signs reviewed.  Physical Exam Constitutional:      General: He is not in acute distress.    Appearance: He is well-developed. He is obese.  Cardiovascular:     Rate and Rhythm: Normal rate and regular rhythm.     Heart sounds: Normal heart sounds.  Pulmonary:     Effort: Pulmonary effort is normal.     Breath sounds: Normal breath sounds.  Skin:    General: Skin is warm and dry.  Neurological:     Mental Status: He is alert and oriented to person, place, and time.  Psychiatric:        Behavior: Behavior normal.        Thought Content: Thought content normal.        Judgment: Judgment normal.         10/03/2021    9:21 AM 06/30/2021   10:49 AM 03/15/2021   10:16 AM 12/14/2020   10:24 AM 06/29/2020    2:46 PM  Depression screen PHQ 2/9  Decreased Interest 0 0 0 0 0  Down, Depressed, Hopeless 0 0 0 0 0  PHQ - 2 Score 0 0 0 0 0       Assessment & Plan:    Patient Active Problem List   Diagnosis Date Noted   Healthcare maintenance 07/11/2019   Oral candidiasis 06/10/2019   Rectal lesion 06/10/2019   Tobacco use disorder 09/26/2013   HIV disease (HCC)    Obese 08/15/2011   SYPHILIS 03/29/2010   AIDS (acquired immune deficiency syndrome) (HCC) 05/06/2009   Type 2 diabetes mellitus with hyperglycemia (HCC) 05/06/2009   DEPRESSION 05/06/2009   Essential hypertension 05/06/2009     Problem List  Items Addressed This Visit       Cardiovascular and Mediastinum   Essential hypertension    Alan Boyle blood pressure is poorly controlled with current regimen and further complicated by his less than optimal nutritional intake. No neurological or ophthalmologic signs/symptoms. Discussed risks of poorly controlled blood pressure including renal and cardiovascular disease. Increase lisinopril to 20 mg. Encouraged to improve nutritional intake. Renal function stable. Monitor blood pressure at home as able.      Relevant Medications   lisinopril (ZESTRIL) 20 MG tablet     Endocrine   Type 2 diabetes mellitus with hyperglycemia (HCC)    Alan Boyle A1c was recently improved and given his less than optimal metformin adherence  and return of polyuria would anticipate worsening of his A1c. Will check A1c today. Discussed importance of taking medication as prescribed. Unfortunately UMAP does not cover extended release metformin. Will continue metformin and Invokana and working to add [name] with application started. Discussed importance of improving nutritional intake. Plan for follow up in 3 months or sooner if needed.       Relevant Medications   metFORMIN (GLUCOPHAGE) 500 MG tablet   canagliflozin (INVOKANA) 100 MG TABS tablet   lisinopril (ZESTRIL) 20 MG tablet   Other Relevant Orders   HgB A1c     Other   SYPHILIS    Syphilis titer stable and down from previous treatment level of 1:64. Recheck RPR.       Relevant Medications   bictegravir-emtricitabine-tenofovir AF (BIKTARVY) 50-200-25 MG TABS tablet   Obese    Mr. Toral weight has been stable to slightly decreased over the past 6 months despite less than optimal nutrition and physical activity. Working on starting [name] which will likely come with weight loss. Encouraged improving nutritional intake and physical activity.   Wt Readings from Last 3 Encounters:  10/03/21 290 lb (131.5 kg)  06/30/21 290 lb (131.5 kg)  03/15/21  (!) 302 lb (137 kg)         Relevant Medications   metFORMIN (GLUCOPHAGE) 500 MG tablet   canagliflozin (INVOKANA) 100 MG TABS tablet   HIV disease (HCC) - Primary    Mr. Marcy continues to have well controlled virus with good adherence and tolerance to his ART regimen of Biktarvy. Reviewed previous lab work and discussed plan of care. Check lab work today. Continue current dose of Biktarvy. Renew financial assistance. Plan for follow up in 3 months or sooner if needed with lab work on the same day.      Relevant Medications   bictegravir-emtricitabine-tenofovir AF (BIKTARVY) 50-200-25 MG TABS tablet   Other Relevant Orders   Comprehensive metabolic panel   HIV-1 RNA quant-no reflex-bld   T-helper cell (CD4)- ([facility] clinic only)   Healthcare maintenance    Discussed importance of safe sexual practice and condom use. Condoms offered.  Vaccines declined. Requested STD testing.  Due for routine dental care with referral placed to [facility]      Other Visit Diagnoses     Screening for STDs (sexually transmitted diseases)       Relevant Orders   RPR   Urine cytology ancillary only   Cytology (oral, anal, urethral) ancillary only   Cytology (oral, anal, urethral) ancillary only        I have discontinued Donella Stade. Stevison's lisinopril. I am also having him start on lisinopril. Additionally, I am having him maintain his metFORMIN, canagliflozin, and Biktarvy.   Meds ordered this encounter  Medications   metFORMIN (GLUCOPHAGE) 500 MG tablet    Sig: Take 1 tablet by mouth twice daily with a meal    Dispense:  60 tablet    Refill:  3    Order Specific Question:   Supervising Provider    Answer:   Drue Second, CYNTHIA [[id number]]   canagliflozin (INVOKANA) 100 MG TABS tablet    Sig: Take 1 tablet (100 mg total) by mouth daily before breakfast.    Dispense:  30 tablet    Refill:  3    Order Specific Question:   Supervising Provider    Answer:   Drue Second, CYNTHIA [[id number]]    bictegravir-emtricitabine-tenofovir AF (BIKTARVY) 50-200-25 MG TABS tablet    Sig: Take 1 tablet by mouth  daily.    Dispense:  30 tablet    Refill:  3    UMAP renewal pending    Order Specific Question:   Supervising Provider    Answer:   Drue Second, CYNTHIA [[id number]]   lisinopril (ZESTRIL) 20 MG tablet    Sig: Take 1 tablet (20 mg total) by mouth daily.    Dispense:  30 tablet    Refill:  3    Order Specific Question:   Supervising Provider    Answer:   Judyann Munson [[id number]]     Follow-up: Return in about 3 months (around 01/03/2022), or if symptoms worsen or fail to improve.   Marcos Eke, MSN, FNP-C Nurse Practitioner [facility]ious [facility] Main number: [phone]

## 2021-10-03 NOTE — Assessment & Plan Note (Signed)
Alan Boyle blood pressure is poorly controlled with current regimen and further complicated by his less than optimal nutritional intake. No neurological or ophthalmologic signs/symptoms. Discussed risks of poorly controlled blood pressure including renal and cardiovascular disease. Increase lisinopril to 20 mg. Encouraged to improve nutritional intake. Renal function stable. Monitor blood pressure at home as able.

## 2021-10-04 LAB — CYTOLOGY, (ORAL, ANAL, URETHRAL) ANCILLARY ONLY
Chlamydia: NEGATIVE
Chlamydia: NEGATIVE
Comment: NEGATIVE
Comment: NEGATIVE
Comment: NORMAL
Comment: NORMAL
Neisseria Gonorrhea: NEGATIVE
Neisseria Gonorrhea: NEGATIVE

## 2021-10-04 LAB — T-HELPER CELL (CD4) - (RCID CLINIC ONLY)
CD4 % Helper T Cell: 20 % — ABNORMAL LOW (ref 33–65)
CD4 T Cell Abs: 377 /uL — ABNORMAL LOW (ref 400–1790)

## 2021-10-05 LAB — URINE CYTOLOGY ANCILLARY ONLY
Candida Urine: NEGATIVE
Chlamydia: NEGATIVE
Comment: NEGATIVE
Comment: NORMAL
Neisseria Gonorrhea: NEGATIVE

## 2021-10-08 LAB — FLUORESCENT TREPONEMAL AB(FTA)-IGG-BLD: Fluorescent Treponemal ABS: REACTIVE — AB

## 2021-10-08 LAB — HIV-1 RNA QUANT-NO REFLEX-BLD
HIV 1 RNA Quant: NOT DETECTED Copies/mL
HIV-1 RNA Quant, Log: NOT DETECTED Log cps/mL

## 2021-10-08 LAB — COMPREHENSIVE METABOLIC PANEL
AG Ratio: 1.2 (calc) (ref 1.0–2.5)
ALT: 12 U/L (ref 9–46)
AST: 10 U/L (ref 10–40)
Albumin: 4 g/dL (ref 3.6–5.1)
Alkaline phosphatase (APISO): 69 U/L (ref 36–130)
BUN: 10 mg/dL (ref 7–25)
CO2: 25 mmol/L (ref 20–32)
Calcium: 9 mg/dL (ref 8.6–10.3)
Chloride: 100 mmol/L (ref 98–110)
Creat: 0.91 mg/dL (ref 0.60–1.29)
Globulin: 3.4 g/dL (calc) (ref 1.9–3.7)
Glucose, Bld: 206 mg/dL — ABNORMAL HIGH (ref 65–99)
Potassium: 4 mmol/L (ref 3.5–5.3)
Sodium: 137 mmol/L (ref 135–146)
Total Bilirubin: 0.4 mg/dL (ref 0.2–1.2)
Total Protein: 7.4 g/dL (ref 6.1–8.1)

## 2021-10-08 LAB — HEMOGLOBIN A1C
Hgb A1c MFr Bld: 9.8 % of total Hgb — ABNORMAL HIGH (ref <5.7)
Mean Plasma Glucose: 235 mg/dL
eAG (mmol/L): 13 mmol/L

## 2021-10-08 LAB — RPR TITER: RPR Titer: 1:4 {titer} — ABNORMAL HIGH

## 2021-10-08 LAB — RPR: RPR Ser Ql: REACTIVE — AB

## 2021-10-17 ENCOUNTER — Other Ambulatory Visit: Payer: Self-pay | Admitting: Family

## 2021-10-17 DIAGNOSIS — I10 Essential (primary) hypertension: Secondary | ICD-10-CM

## 2021-10-25 ENCOUNTER — Other Ambulatory Visit: Payer: Self-pay | Admitting: Family

## 2021-10-25 DIAGNOSIS — E1165 Type 2 diabetes mellitus with hyperglycemia: Secondary | ICD-10-CM

## 2021-12-30 NOTE — Progress Notes (Deleted)
Brief Narrative   Patient ID: Alan Boyle, male    DOB: 03-27-1980, 41 y.o.   MRN: [id number]  Alan Boyle is a 41 year old African-American male with HIV disease with risk factor of MSM.  Initial viral load and CD4 count are unavailable.  Most recent genotype from 3/21 with no significant medication resistance mutations. [id number] negative. No history of opportunistic infection. Previous ART history of a Atripla.   Subjective:    No chief complaint on file.   HPI:  Alan Boyle is a 41 y.o. male with HIV/AIDS last seen 10/03/21 with well-controlled virus and good adherence and tolerance to Biktarvy.  Viral load was undetectable with CD4 count of 377.  RPR was serofast at 1: 4 (treatment at 1:64).  Kidney function, liver function, electrolytes within normal ranges.  Glucose elevated at 206 and hemoglobin 9.8.  Had less than optimal adherence to metformin and was continued on metformin with addition of Invokana and started on Ozempic. Here today for follow up.   No Known Allergies    Outpatient Medications Prior to Visit  Medication Sig Dispense Refill   bictegravir-emtricitabine-tenofovir AF (BIKTARVY) 50-200-25 MG TABS tablet Take 1 tablet by mouth daily. 30 tablet 3   canagliflozin (INVOKANA) 100 MG TABS tablet Take 1 tablet (100 mg total) by mouth daily before breakfast. 30 tablet 3   lisinopril (ZESTRIL) 20 MG tablet Take 1 tablet (20 mg total) by mouth daily. 30 tablet 3   metFORMIN (GLUCOPHAGE) 500 MG tablet Take 1 tablet by mouth twice daily with a meal 60 tablet 3   No facility-administered medications prior to visit.     Past Medical History:  Diagnosis Date   Diabetes mellitus    NO MEDS (FINANCIAL ISSUE)   HIV disease (HCC)    Hypertension    NO MEDS (FINANCIAL ISSUE)   Rectal mass    Rectal pain    Syphilis      Past Surgical History:  Procedure Laterality Date   TUMOR EXCISION N/A 12/16/2012   Procedure: EXAM UNDER ANESTHESIA AND REMOVAL OF RECTAL  MASS;  Surgeon: Mariella Saa, MD;  Location: [facility];  Service: General;  Laterality: N/A;      Review of Systems  Constitutional:  Negative for appetite change, chills, fatigue, fever and unexpected weight change.  Eyes:  Negative for visual disturbance.       Negative for changes in vision.  Respiratory:  Negative for cough, chest tightness, shortness of breath and wheezing.   Cardiovascular:  Negative for chest pain, palpitations and leg swelling.  Gastrointestinal:  Negative for abdominal pain, constipation, diarrhea, nausea and vomiting.  Endocrine: Negative for polydipsia, polyphagia and polyuria.  Genitourinary:  Negative for dysuria, flank pain, frequency, genital sores, hematuria and urgency.  Skin:  Negative for rash.  Allergic/Immunologic: Negative for immunocompromised state.  Neurological:  Negative for dizziness, weakness, light-headedness and headaches.      Objective:    There were no vitals taken for this visit. Nursing note and vital signs reviewed.  Physical Exam Constitutional:      General: He is not in acute distress.    Appearance: He is well-developed.  Eyes:     Conjunctiva/sclera: Conjunctivae normal.  Cardiovascular:     Rate and Rhythm: Normal rate and regular rhythm.     Heart sounds: Normal heart sounds. No murmur heard.    No friction rub. No gallop.  Pulmonary:     Effort: Pulmonary effort is normal. No respiratory  distress.     Breath sounds: Normal breath sounds. No wheezing or rales.  Chest:     Chest wall: No tenderness.  Abdominal:     General: Bowel sounds are normal.     Palpations: Abdomen is soft.     Tenderness: There is no abdominal tenderness.  Musculoskeletal:     Cervical back: Neck supple.  Lymphadenopathy:     Cervical: No cervical adenopathy.  Skin:    General: Skin is warm and dry.     Findings: No rash.  Neurological:     Mental Status: He is alert and oriented to person, place, and time.   Psychiatric:        Behavior: Behavior normal.        Thought Content: Thought content normal.        Judgment: Judgment normal.         10/03/2021    9:21 AM 06/30/2021   10:49 AM 03/15/2021   10:16 AM 12/14/2020   10:24 AM 06/29/2020    2:46 PM  Depression screen PHQ 2/9  Decreased Interest 0 0 0 0 0  Down, Depressed, Hopeless 0 0 0 0 0  PHQ - 2 Score 0 0 0 0 0       Assessment & Plan:    Patient Active Problem List   Diagnosis Date Noted   Healthcare maintenance 07/11/2019   Oral candidiasis 06/10/2019   Rectal lesion 06/10/2019   Tobacco use disorder 09/26/2013   HIV disease ([place])    Obese 08/15/2011   SYPHILIS 03/29/2010   AIDS (acquired immune deficiency syndrome) ([place]) 05/06/2009   Type 2 diabetes mellitus with hyperglycemia ([place]) 05/06/2009   DEPRESSION 05/06/2009   Essential hypertension 05/06/2009     Problem List Items Addressed This Visit   None    I am having Vernelle Emerald maintain his metFORMIN, canagliflozin, Biktarvy, and lisinopril.   No orders of the defined types were placed in this encounter.    Follow-up: No follow-ups on file.   Terri Piedra, MSN, FNP-C Nurse Practitioner [facility] for [facility]ious Disease [place] number: [phone]

## 2022-01-09 ENCOUNTER — Telehealth: Payer: Self-pay

## 2022-01-09 ENCOUNTER — Ambulatory Visit: Payer: Self-pay | Admitting: Family

## 2022-01-09 NOTE — Telephone Encounter (Signed)
Called patient to reschedule missed appointment. No answer. Left HIPAA-compliant voicemail requesting call back. Will send MyChart message.  Verla Bryngelson E Jacia Sickman, RN  

## 2022-01-10 ENCOUNTER — Encounter: Payer: Self-pay | Admitting: Family

## 2022-01-10 ENCOUNTER — Other Ambulatory Visit: Payer: Self-pay

## 2022-01-10 ENCOUNTER — Ambulatory Visit (INDEPENDENT_AMBULATORY_CARE_PROVIDER_SITE_OTHER): Payer: Self-pay | Admitting: Family

## 2022-01-10 VITALS — BP 143/92 | HR 88 | Temp 98.0°F | Resp 16 | Ht 74.0 in | Wt 285.0 lb

## 2022-01-10 DIAGNOSIS — B20 Human immunodeficiency virus [HIV] disease: Secondary | ICD-10-CM

## 2022-01-10 DIAGNOSIS — A539 Syphilis, unspecified: Secondary | ICD-10-CM

## 2022-01-10 DIAGNOSIS — Z79899 Other long term (current) drug therapy: Secondary | ICD-10-CM

## 2022-01-10 DIAGNOSIS — Z0000 Encounter for general adult medical examination without abnormal findings: Secondary | ICD-10-CM

## 2022-01-10 DIAGNOSIS — I10 Essential (primary) hypertension: Secondary | ICD-10-CM

## 2022-01-10 DIAGNOSIS — E1165 Type 2 diabetes mellitus with hyperglycemia: Secondary | ICD-10-CM

## 2022-01-10 MED ORDER — LISINOPRIL 20 MG PO TABS: 20.0000 mg | ORAL_TABLET | Freq: Every day | ORAL | 3 refills | Status: DC

## 2022-01-10 MED ORDER — CANAGLIFLOZIN 100 MG PO TABS: 100.0000 mg | ORAL_TABLET | Freq: Every day | ORAL | 3 refills | Status: DC

## 2022-01-10 MED ORDER — METFORMIN HCL 500 MG PO TABS: ORAL_TABLET | ORAL | 3 refills | Status: DC

## 2022-01-10 MED ORDER — BIKTARVY 50-200-25 MG PO TABS: 1.0000 | ORAL_TABLET | Freq: Every day | ORAL | 3 refills | Status: DC

## 2022-01-10 NOTE — Assessment & Plan Note (Signed)
Alan Boyle's RPR appears serofast at 1:4. No evidence of new infection. Continue to monitor RPR.

## 2022-01-10 NOTE — Assessment & Plan Note (Signed)
Mr. Switalski continues to have well controlled virus with good adherence and tolerance to Biktarvy. Reviewed previous lab work and discussed plan of care. Check lab work today. Continue current dose Biktarvy. Plan for follow up in 3 months or sooner if needed.

## 2022-01-10 NOTE — Assessment & Plan Note (Signed)
Alan Boyle appears to have improved adherence and good tolerance to metformin and canagliflozin. No current urinary symptoms. Check A1c. Discussed reducing amount of [employer] intake as this does not help his blood sugars with the goal of achieving <7.0% A1c.  Continue current dose of metformin and canagliflozin. Plan for follow up in 3 month or sooner if needed.

## 2022-01-10 NOTE — Assessment & Plan Note (Signed)
Blood pressure near goal with good adherence and tolerance to lisinopril and no cough. No neurological/ophthalmologic signs/symptoms. Continue current dose of lisinopril.

## 2022-01-10 NOTE — Assessment & Plan Note (Signed)
Discussed importance of safe sexual practice and condom use. Condoms and STD testing offered.  Declines vaccines.  

## 2022-01-10 NOTE — Progress Notes (Signed)
Brief Narrative   Patient ID: Alan Boyle, male    DOB: [birth year], 40 y.o.   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  Initial viral load and CD4 count are unavailable.  Most recent genotype from 3/21 with no significant medication resistance mutations. [id number] negative. No history of opportunistic infection. Previous ART history of a Atripla.   Subjective:    Chief Complaint  Patient presents with   Follow-up    B20     HPI:  Alan Boyle is a 41 y.o. male with HIV disease last seen on 10/03/2021 with well-controlled virus and good adherence and tolerance to [employer].  Viral load was undetectable with CD4 count of 377.  RPR was serofast at 1: 4.  Kidney function, liver function, electrolytes within normal ranges.  Blood pressure was poorly controlled with lisinopril increased to 20 mg.  Type 2 diabetes was also poorly controlled with hemoglobin A1c of 9.8 and less than optimal adherence to metformin.  Here today for follow-up.  Alan Boyle has been doing well since his last office visit and taking his medications as prescribed with no adverse side effects or problems obtaining them from the pharmacy. Urinary symptoms have improved with continued fatigue. Drinking Coca-[employer]a on a daily basis. No new concerns/complaints. Searching for employment. Condoms and STD testing offered. Declines vaccinations.   Denies fevers, chills, night sweats, headaches, changes in vision, neck pain/stiffness, nausea, diarrhea, vomiting, lesions or rashes.   No Known Allergies    Outpatient Medications Prior to Visit  Medication Sig Dispense Refill   bictegravir-emtricitabine-tenofovir AF (BIKTARVY) 50-200-25 MG TABS tablet Take 1 tablet by mouth daily. 30 tablet 3   canagliflozin (INVOKANA) 100 MG TABS tablet Take 1 tablet (100 mg total) by mouth daily before breakfast. 30 tablet 3   lisinopril (ZESTRIL) 20 MG tablet Take 1  tablet (20 mg total) by mouth daily. 30 tablet 3   metFORMIN (GLUCOPHAGE) 500 MG tablet Take 1 tablet by mouth twice daily with a meal 60 tablet 3   No facility-administered medications prior to visit.     Past Medical History:  Diagnosis Date   Diabetes mellitus    NO MEDS (FINANCIAL ISSUE)   HIV disease ([place])    Hypertension    NO MEDS (FINANCIAL ISSUE)   Rectal mass    Rectal pain    Syphilis      Past Surgical History:  Procedure Laterality Date   TUMOR EXCISION N/A 12/16/2012   Procedure: EXAM UNDER ANESTHESIA AND REMOVAL OF RECTAL MASS;  Surgeon: Edward Jolly, MD;  Location: [facility];  Service: General;  Laterality: N/A;      Review of Systems  Constitutional:  Positive for fatigue. Negative for appetite change, chills, fever and unexpected weight change.  Eyes:  Negative for visual disturbance.       Negative for changes in vision.  Respiratory:  Negative for cough, chest tightness, shortness of breath and wheezing.   Cardiovascular:  Negative for chest pain, palpitations and leg swelling.  Gastrointestinal:  Negative for abdominal pain, constipation, diarrhea, nausea and vomiting.  Endocrine: Negative for polydipsia, polyphagia and polyuria.  Genitourinary:  Negative for dysuria, flank pain, frequency, genital sores, hematuria and urgency.  Skin:  Negative for rash.  Allergic/Immunologic: Negative for immunocompromised state.  Neurological:  Negative for dizziness, weakness, light-headedness and headaches.      Objective:    BP (!) 143/92  Pulse 88   Temp 98 F (36.7 C) (Oral)   Resp 16   Ht 6\' 2"  (1.88 m)   Wt 285 lb (129.3 kg)   SpO2 98%   BMI 36.59 kg/m  Nursing note and vital signs reviewed.   Physical Exam Constitutional:      General: He is not in acute distress.    Appearance: He is well-developed.  Eyes:     Conjunctiva/sclera: Conjunctivae normal.  Cardiovascular:     Rate and Rhythm: Normal rate and regular  rhythm.     Heart sounds: Normal heart sounds. No murmur heard.    No friction rub. No gallop.  Pulmonary:     Effort: Pulmonary effort is normal. No respiratory distress.     Breath sounds: Normal breath sounds. No wheezing or rales.  Chest:     Chest wall: No tenderness.  Abdominal:     General: Bowel sounds are normal.     Palpations: Abdomen is soft.     Tenderness: There is no abdominal tenderness.  Musculoskeletal:     Cervical back: Neck supple.  Lymphadenopathy:     Cervical: No cervical adenopathy.  Skin:    General: Skin is warm and dry.     Findings: No rash.  Neurological:     Mental Status: He is alert and oriented to person, place, and time.  Psychiatric:        Behavior: Behavior normal.        Thought Content: Thought content normal.        Judgment: Judgment normal.         01/10/2022    1:50 PM 10/03/2021    9:21 AM 06/30/2021   10:49 AM 03/15/2021   10:16 AM 12/14/2020   10:24 AM  Depression screen PHQ 2/9  Decreased Interest 0 0 0 0 0  Down, Depressed, Hopeless 0 0 0 0 0  PHQ - 2 Score 0 0 0 0 0       Assessment & Plan:    Patient Active Problem List   Diagnosis Date Noted   Healthcare maintenance 07/11/2019   Oral candidiasis 06/10/2019   Rectal lesion 06/10/2019   Tobacco use disorder 09/26/2013   HIV disease ([place])    Obese 08/15/2011   SYPHILIS 03/29/2010   AIDS (acquired immune deficiency syndrome) ([place]) 05/06/2009   Type 2 diabetes mellitus with hyperglycemia ([place]) 05/06/2009   DEPRESSION 05/06/2009   Essential hypertension 05/06/2009     Problem List Items Addressed This Visit       Cardiovascular and Mediastinum   Essential hypertension    Blood pressure near goal with good adherence and tolerance to lisinopril and no cough. No neurological/ophthalmologic signs/symptoms. Continue current dose of lisinopril.       Relevant Medications   lisinopril (ZESTRIL) 20 MG tablet     Endocrine   Type 2 diabetes mellitus with  hyperglycemia [facility])    Alan Boyle appears to have improved adherence and good tolerance to metformin and canagliflozin. No current urinary symptoms. Check A1c. Discussed reducing amount of [employer] intake as this does not help his blood sugars with the goal of achieving <7.0% A1c.  Continue current dose of metformin and canagliflozin. Plan for follow up in 3 month or sooner if needed.       Relevant Medications   metFORMIN (GLUCOPHAGE) 500 MG tablet   lisinopril (ZESTRIL) 20 MG tablet   canagliflozin (INVOKANA) 100 MG TABS tablet   Other Relevant Orders   Lipid panel  HgB A1c     Other   SYPHILIS    Alan Boyle RPR appears serofast at 1:4. No evidence of new infection. Continue to monitor RPR.       Relevant Medications   bictegravir-emtricitabine-tenofovir AF (BIKTARVY) 50-200-25 MG TABS tablet   Other Relevant Orders   RPR   HIV disease ([place]) - Primary    Alan Boyle continues to have well controlled virus with good adherence and tolerance to Biktarvy. Reviewed previous lab work and discussed plan of care. Check lab work today. Continue current dose Biktarvy. Plan for follow up in 3 months or sooner if needed.       Relevant Medications   bictegravir-emtricitabine-tenofovir AF (BIKTARVY) 50-200-25 MG TABS tablet   Other Relevant Orders   Comprehensive metabolic panel   HIV-1 RNA quant-no reflex-bld   T-helper cell (CD4)- ([facility] only)   Healthcare maintenance    Discussed importance of safe sexual practice and condom use. Condoms and STD testing offered.  Declines vaccines      Other Visit Diagnoses     Pharmacologic therapy       Relevant Orders   Lipid panel        I am having Alan Boyle maintain his metFORMIN, lisinopril, canagliflozin, and Biktarvy.   Meds ordered this encounter  Medications   metFORMIN (GLUCOPHAGE) 500 MG tablet    Sig: Take 1 tablet by mouth twice daily with a meal    Dispense:  60 tablet    Refill:  3    Order Specific  Question:   Supervising Provider    Answer:   Baxter Flattery, CYNTHIA [[id number]]   lisinopril (ZESTRIL) 20 MG tablet    Sig: Take 1 tablet (20 mg total) by mouth daily.    Dispense:  30 tablet    Refill:  3    Order Specific Question:   Supervising Provider    Answer:   Baxter Flattery, CYNTHIA [[id number]]   canagliflozin (INVOKANA) 100 MG TABS tablet    Sig: Take 1 tablet (100 mg total) by mouth daily before breakfast.    Dispense:  30 tablet    Refill:  3    Order Specific Question:   Supervising Provider    Answer:   Baxter Flattery, CYNTHIA [[id number]]   bictegravir-emtricitabine-tenofovir AF (BIKTARVY) 50-200-25 MG TABS tablet    Sig: Take 1 tablet by mouth daily.    Dispense:  30 tablet    Refill:  3    Order Specific Question:   Supervising Provider    Answer:   Carlyle Basques [[id number]]     Follow-up: Return in about 3 months (around 04/12/2022), or if symptoms worsen or fail to improve.   Terri Piedra, MSN, FNP-C Nurse Practitioner [facility] for Infectious Disease [place] number: [phone]

## 2022-01-10 NOTE — Patient Instructions (Signed)
Nice to see you.  We will check your lab work today.  Continue to take your medication daily as prescribed.  Refills have been sent to the pharmacy.  Plan for follow up in 3 months or sooner if needed with lab work on the same day.  Have a great day and stay safe!  

## 2022-01-11 LAB — T-HELPER CELL (CD4) - (RCID CLINIC ONLY)
CD4 % Helper T Cell: 22 % — ABNORMAL LOW (ref 33–65)
CD4 T Cell Abs: 428 /uL (ref 400–1790)

## 2022-01-12 LAB — RPR TITER: RPR Titer: 1:8 {titer} — ABNORMAL HIGH

## 2022-01-12 LAB — COMPREHENSIVE METABOLIC PANEL
AG Ratio: 1.2 (calc) (ref 1.0–2.5)
ALT: 11 U/L (ref 9–46)
AST: 11 U/L (ref 10–40)
Albumin: 4 g/dL (ref 3.6–5.1)
Alkaline phosphatase (APISO): 74 U/L (ref 36–130)
BUN: 10 mg/dL (ref 7–25)
CO2: 28 mmol/L (ref 20–32)
Calcium: 9 mg/dL (ref 8.6–10.3)
Chloride: 100 mmol/L (ref 98–110)
Creat: 0.93 mg/dL (ref 0.60–1.29)
Globulin: 3.3 g/dL (calc) (ref 1.9–3.7)
Glucose, Bld: 165 mg/dL — ABNORMAL HIGH (ref 65–99)
Potassium: 3.9 mmol/L (ref 3.5–5.3)
Sodium: 138 mmol/L (ref 135–146)
Total Bilirubin: 0.7 mg/dL (ref 0.2–1.2)
Total Protein: 7.3 g/dL (ref 6.1–8.1)

## 2022-01-12 LAB — HIV-1 RNA QUANT-NO REFLEX-BLD
HIV 1 RNA Quant: NOT DETECTED Copies/mL
HIV-1 RNA Quant, Log: NOT DETECTED Log cps/mL

## 2022-01-12 LAB — HEMOGLOBIN A1C
Hgb A1c MFr Bld: 9.5 % of total Hgb — ABNORMAL HIGH (ref <5.7)
Mean Plasma Glucose: 226 mg/dL
eAG (mmol/L): 12.5 mmol/L

## 2022-01-12 LAB — LIPID PANEL
Cholesterol: 160 mg/dL (ref <200)
HDL: 36 mg/dL — ABNORMAL LOW (ref >=40)
LDL Cholesterol (Calc): 105 mg/dL (calc) — ABNORMAL HIGH
Non-HDL Cholesterol (Calc): 124 mg/dL (calc) (ref <130)
Total CHOL/HDL Ratio: 4.4 (calc) (ref <5.0)
Triglycerides: 99 mg/dL (ref <150)

## 2022-01-12 LAB — RPR: RPR Ser Ql: REACTIVE — AB

## 2022-01-12 LAB — FLUORESCENT TREPONEMAL AB(FTA)-IGG-BLD: Fluorescent Treponemal ABS: REACTIVE — AB

## 2022-01-21 ENCOUNTER — Other Ambulatory Visit: Payer: Self-pay | Admitting: Family

## 2022-04-03 ENCOUNTER — Ambulatory Visit: Payer: Self-pay | Admitting: Internal Medicine

## 2022-04-04 ENCOUNTER — Ambulatory Visit: Payer: Self-pay | Admitting: Family

## 2022-05-04 ENCOUNTER — Telehealth: Payer: Self-pay

## 2022-05-04 ENCOUNTER — Other Ambulatory Visit: Payer: Self-pay

## 2022-05-04 ENCOUNTER — Ambulatory Visit (INDEPENDENT_AMBULATORY_CARE_PROVIDER_SITE_OTHER): Payer: Self-pay | Admitting: Family

## 2022-05-04 ENCOUNTER — Encounter: Payer: Self-pay | Admitting: Family

## 2022-05-04 VITALS — BP 129/84 | HR 101 | Temp 97.8°F | Ht 74.0 in | Wt 294.0 lb

## 2022-05-04 DIAGNOSIS — B20 Human immunodeficiency virus [HIV] disease: Secondary | ICD-10-CM

## 2022-05-04 DIAGNOSIS — Z0000 Encounter for general adult medical examination without abnormal findings: Secondary | ICD-10-CM

## 2022-05-04 DIAGNOSIS — E1165 Type 2 diabetes mellitus with hyperglycemia: Secondary | ICD-10-CM

## 2022-05-04 DIAGNOSIS — Z79899 Other long term (current) drug therapy: Secondary | ICD-10-CM

## 2022-05-04 DIAGNOSIS — I10 Essential (primary) hypertension: Secondary | ICD-10-CM

## 2022-05-04 DIAGNOSIS — Z113 Encounter for screening for infections with a predominantly sexual mode of transmission: Secondary | ICD-10-CM

## 2022-05-04 MED ORDER — LISINOPRIL 20 MG PO TABS: 20.0000 mg | ORAL_TABLET | Freq: Every day | ORAL | 3 refills | Status: DC

## 2022-05-04 MED ORDER — CANAGLIFLOZIN 100 MG PO TABS: 100.0000 mg | ORAL_TABLET | Freq: Every day | ORAL | 3 refills | Status: DC

## 2022-05-04 MED ORDER — METFORMIN HCL 500 MG PO TABS: ORAL_TABLET | ORAL | 3 refills | Status: DC

## 2022-05-04 MED ORDER — BIKTARVY 50-200-25 MG PO TABS: 1.0000 | ORAL_TABLET | Freq: Every day | ORAL | 3 refills | Status: DC

## 2022-05-04 NOTE — Telephone Encounter (Signed)
Attempted to call patient regarding resources for eye exam. Per provider patient needs diabetic eye exam, but is currently uninsured.  Patient can enroll in [place] expansion or could provide patient information for [employer].  Left voicemail requesting call back. Leatrice Jewels, RMA

## 2022-05-04 NOTE — Assessment & Plan Note (Signed)
Diane's A1c is improved from previous but still remains poorly controlled with A1c of 9.5. Encouraged to continue to work on nutritional component with decreasing soda and sweet tea. No numbness and tingling at present. Needs diabetic eye exam and will work to find resources since he is uninsured. Continue current dose of metformin and Invokana. Check A1c today. Plan for follow up in 3 months or sooner if needed.

## 2022-05-04 NOTE — Assessment & Plan Note (Signed)
Alan Boyle has well controlled blood pressure and no adverse side effects with linsinopril. Denies cough and potassium levels normal. No neurological or ophthalmologic signs/symptoms. Check lab work. Continue to monitor blood pressure at home as able. Continue current dose of lisinopril.

## 2022-05-04 NOTE — Assessment & Plan Note (Signed)
Discussed importance of safe sexual practice and condom use. Condoms and STD testing offered.  Declines vaccines Dental appointment information provided in AVS.

## 2022-05-04 NOTE — Assessment & Plan Note (Signed)
Mohammed continues to have well controlled virus with good adherence and tolerance to [employer]. Reviewed previous lab work and discussed plan of care. Check lab work. Will need to renew financial assistance after July 1st. Continue current dose of Biktarvy. Plan for follow up in 3 months or sooner if needed with lab work on the same day.

## 2022-05-04 NOTE — Progress Notes (Signed)
Brief Narrative   Patient ID: Alan Boyle, male    DOB: 1981/03/10, 42 y.o.   MRN: [id number]  Alan Boyle is a 42 year old African-American male with HIV disease with risk factor of MSM.  Initial viral load and CD4 count are unavailable.  Most recent genotype from 3/21 with no significant medication resistance mutations. [id number] negative. No history of opportunistic infection. Previous ART history of a Atripla.    Subjective:    Chief Complaint  Patient presents with   Follow-up    Metformin off two weeks now on it again.     HPI:  Alan Boyle is a 42 y.o. male with HIV disease, hypertension and Type 2 diabetes last seen on 01/10/22 with well controlled virus and good adherence and tolerance to Biktarvy. Hypertension adequately controlled with lisinopril. Diabetes remains less than optimally controlled but improved with A1c of 9.5. Here today for follow up.  [name] has been doing well since his last office visit. He has been out of his metformin for two weeks but now is taking it once again. Continues to take Biktarvy and lisinopril with no adverse side effects or problems obtaining from the pharmacy.  Has dental concerns and requesting contact information for dental clinic. Was not able to cut down on soda or tea as previously discussed. Condoms and STD testing offered. Declines vaccines.   Denies fevers, chills, night sweats, headaches, changes in vision, neck pain/stiffness, nausea, diarrhea, vomiting, lesions or rashes.   No Known Allergies    Outpatient Medications Prior to Visit  Medication Sig Dispense Refill   bictegravir-emtricitabine-tenofovir AF (BIKTARVY) 50-200-25 MG TABS tablet Take 1 tablet by mouth daily. 30 tablet 3   canagliflozin (INVOKANA) 100 MG TABS tablet Take 1 tablet (100 mg total) by mouth daily before breakfast. 30 tablet 3   lisinopril (ZESTRIL) 20 MG tablet Take 1 tablet (20 mg total) by mouth daily. 30 tablet 3   metFORMIN (GLUCOPHAGE)  500 MG tablet Take 1 tablet by mouth twice daily with a meal 60 tablet 3   No facility-administered medications prior to visit.     Past Medical History:  Diagnosis Date   Diabetes mellitus    NO MEDS (FINANCIAL ISSUE)   HIV disease ([place])    Hypertension    NO MEDS (FINANCIAL ISSUE)   Rectal mass    Rectal pain    Syphilis      Past Surgical History:  Procedure Laterality Date   TUMOR EXCISION N/A 12/16/2012   Procedure: EXAM UNDER ANESTHESIA AND REMOVAL OF RECTAL MASS;  Surgeon: Edward Jolly, MD;  Location: [facility];  Service: General;  Laterality: N/A;      Review of Systems  Constitutional:  Negative for appetite change, chills, fatigue, fever and unexpected weight change.  Eyes:  Negative for visual disturbance.  Respiratory:  Negative for cough, chest tightness, shortness of breath and wheezing.   Cardiovascular:  Negative for chest pain and leg swelling.  Gastrointestinal:  Negative for abdominal pain, constipation, diarrhea, nausea and vomiting.  Genitourinary:  Negative for dysuria, flank pain, frequency, genital sores, hematuria and urgency.  Skin:  Negative for rash.  Allergic/Immunologic: Negative for immunocompromised state.  Neurological:  Negative for dizziness and headaches.      Objective:    BP 129/84   Pulse (!) 101   Temp 97.8 F (36.6 C) (Oral)   Ht 6' 2"$  (1.88 m)   Wt 294 lb (133.4 kg)   SpO2 99%  BMI 37.75 kg/m  Nursing note and vital signs reviewed.  Physical Exam Constitutional:      General: He is not in acute distress.    Appearance: He is well-developed.  Eyes:     Conjunctiva/sclera: Conjunctivae normal.  Cardiovascular:     Rate and Rhythm: Normal rate and regular rhythm.     Heart sounds: Normal heart sounds. No murmur heard.    No friction rub. No gallop.  Pulmonary:     Effort: Pulmonary effort is normal. No respiratory distress.     Breath sounds: Normal breath sounds. No wheezing or rales.   Chest:     Chest wall: No tenderness.  Abdominal:     General: Bowel sounds are normal.     Palpations: Abdomen is soft.     Tenderness: There is no abdominal tenderness.  Musculoskeletal:     Cervical back: Neck supple.  Lymphadenopathy:     Cervical: No cervical adenopathy.  Skin:    General: Skin is warm and dry.     Findings: No rash.  Neurological:     Mental Status: He is alert and oriented to person, place, and time.  Psychiatric:        Behavior: Behavior normal.        Thought Content: Thought content normal.        Judgment: Judgment normal.         01/10/2022    1:50 PM 10/03/2021    9:21 AM 06/30/2021   10:49 AM 03/15/2021   10:16 AM 12/14/2020   10:24 AM  Depression screen PHQ 2/9  Decreased Interest 0 0 0 0 0  Down, Depressed, Hopeless 0 0 0 0 0  PHQ - 2 Score 0 0 0 0 0       Assessment & Plan:    Patient Active Problem List   Diagnosis Date Noted   Healthcare maintenance 07/11/2019   Oral candidiasis 06/10/2019   Rectal lesion 06/10/2019   Tobacco use disorder 09/26/2013   HIV disease ([place])    Obese 08/15/2011   SYPHILIS 03/29/2010   AIDS (acquired immune deficiency syndrome) ([place]) 05/06/2009   Type 2 diabetes mellitus with hyperglycemia ([place]) 05/06/2009   DEPRESSION 05/06/2009   Essential hypertension 05/06/2009     Problem List Items Addressed This Visit       Cardiovascular and Mediastinum   Essential hypertension    Donyell has well controlled blood pressure and no adverse side effects with linsinopril. Denies cough and potassium levels normal. No neurological or ophthalmologic signs/symptoms. Check lab work. Continue to monitor blood pressure at home as able. Continue current dose of lisinopril.       Relevant Medications   lisinopril (ZESTRIL) 20 MG tablet     Endocrine   Type 2 diabetes mellitus with hyperglycemia (HCC)    Alan Boyle's A1c is improved from previous but still remains poorly controlled with A1c of 9.5. Encouraged to  continue to work on nutritional component with decreasing soda and sweet tea. No numbness and tingling at present. Needs diabetic eye exam and will work to find resources since he is uninsured. Continue current dose of metformin and Invokana. Check A1c today. Plan for follow up in 3 months or sooner if needed.       Relevant Medications   canagliflozin (INVOKANA) 100 MG TABS tablet   lisinopril (ZESTRIL) 20 MG tablet   metFORMIN (GLUCOPHAGE) 500 MG tablet   Other Relevant Orders   HgB A1c   AMB REFERRAL TO COMMUNITY SERVICE  AGENCY     Other   HIV disease ([place]) - Primary    Inesh continues to have well controlled virus with good adherence and tolerance to [employer]. Reviewed previous lab work and discussed plan of care. Check lab work. Will need to renew financial assistance after July 1st. Continue current dose of Biktarvy. Plan for follow up in 3 months or sooner if needed with lab work on the same day.       Relevant Medications   bictegravir-emtricitabine-tenofovir AF (BIKTARVY) 50-200-25 MG TABS tablet   Other Relevant Orders   COMPLETE METABOLIC PANEL WITH GFR   HIV-1 RNA quant-no reflex-bld   T-helper cell (CD4)- ([facility] clinic only)   AMB REFERRAL TO [place] maintenance    Discussed importance of safe sexual practice and condom use. Condoms and STD testing offered.  Declines vaccines Dental appointment information provided in AVS.       Other Visit Diagnoses     Pharmacologic therapy       Screening for STDs (sexually transmitted diseases)       Relevant Orders   RPR        I am having Vernelle Emerald maintain his Biktarvy, canagliflozin, lisinopril, and metFORMIN.   Meds ordered this encounter  Medications   bictegravir-emtricitabine-tenofovir AF (BIKTARVY) 50-200-25 MG TABS tablet    Sig: Take 1 tablet by mouth daily.    Dispense:  30 tablet    Refill:  3    Order Specific Question:   Supervising Provider    Answer:   Baxter Flattery,  CYNTHIA [[id number]]   canagliflozin (INVOKANA) 100 MG TABS tablet    Sig: Take 1 tablet (100 mg total) by mouth daily before breakfast.    Dispense:  30 tablet    Refill:  3    Order Specific Question:   Supervising Provider    Answer:   Baxter Flattery, CYNTHIA [[id number]]   lisinopril (ZESTRIL) 20 MG tablet    Sig: Take 1 tablet (20 mg total) by mouth daily.    Dispense:  30 tablet    Refill:  3    Order Specific Question:   Supervising Provider    Answer:   Baxter Flattery, CYNTHIA [id number]   metFORMIN (GLUCOPHAGE) 500 MG tablet    Sig: Take 1 tablet by mouth twice daily with a meal    Dispense:  60 tablet    Refill:  3    Order Specific Question:   Supervising Provider    Answer:   Carlyle Basques [[id number]]     Follow-up: Return in about 3 months (around 08/02/2022), or if symptoms worsen or fail to improve.   Terri Piedra, MSN, FNP-C Nurse Practitioner [facility] for Infectious Disease [place] number: [phone]

## 2022-05-04 NOTE — Patient Instructions (Addendum)
Nice to see you.  We will check your lab work today.  Continue to take your medication daily as prescribed.  Refills have been sent to the pharmacy.  Please call [facility]) to schedule/follow up on your dental care at [phone]  Plan for follow up in 3 months or sooner if needed with lab work on the same day.  Have a great day and stay safe!

## 2022-05-05 LAB — T-HELPER CELL (CD4) - (RCID CLINIC ONLY)
CD4 % Helper T Cell: 22 % — ABNORMAL LOW (ref 33–65)
CD4 T Cell Abs: 395 /uL — ABNORMAL LOW (ref 400–1790)

## 2022-05-07 LAB — COMPLETE METABOLIC PANEL WITH GFR
AG Ratio: 1.1 (calc) (ref 1.0–2.5)
ALT: 11 U/L (ref 9–46)
AST: 9 U/L — ABNORMAL LOW (ref 10–40)
Albumin: 3.9 g/dL (ref 3.6–5.1)
Alkaline phosphatase (APISO): 72 U/L (ref 36–130)
BUN: 13 mg/dL (ref 7–25)
CO2: 25 mmol/L (ref 20–32)
Calcium: 9.2 mg/dL (ref 8.6–10.3)
Chloride: 100 mmol/L (ref 98–110)
Creat: 1.08 mg/dL (ref 0.60–1.29)
Globulin: 3.4 g/dL (calc) (ref 1.9–3.7)
Glucose, Bld: 360 mg/dL — ABNORMAL HIGH (ref 65–99)
Potassium: 4.1 mmol/L (ref 3.5–5.3)
Sodium: 135 mmol/L (ref 135–146)
Total Bilirubin: 0.2 mg/dL (ref 0.2–1.2)
Total Protein: 7.3 g/dL (ref 6.1–8.1)
eGFR: 87 mL/min/{1.73_m2} (ref >=60)

## 2022-05-07 LAB — HIV-1 RNA QUANT-NO REFLEX-BLD
HIV 1 RNA Quant: NOT DETECTED Copies/mL
HIV-1 RNA Quant, Log: NOT DETECTED Log cps/mL

## 2022-05-07 LAB — HEMOGLOBIN A1C
Hgb A1c MFr Bld: 10.2 % of total Hgb — ABNORMAL HIGH (ref <5.7)
Mean Plasma Glucose: 246 mg/dL
eAG (mmol/L): 13.6 mmol/L

## 2022-05-07 LAB — T PALLIDUM AB: T Pallidum Abs: POSITIVE — AB

## 2022-05-07 LAB — RPR: RPR Ser Ql: REACTIVE — AB

## 2022-05-07 LAB — RPR TITER: RPR Titer: 1:32 {titer} — ABNORMAL HIGH

## 2022-05-08 ENCOUNTER — Telehealth: Payer: Self-pay

## 2022-05-08 NOTE — Telephone Encounter (Signed)
Called patient to relay results and schedule treatment, no answer. Left HIPAA compliant voicemail requesting callback.   Beryle Flock, RN

## 2022-05-08 NOTE — Telephone Encounter (Signed)
RPR titer up to 1:32.    Latest Reference Range & Units 10/03/21 10:08 01/10/22 14:14 05/04/22 11:54  T Pallidum Abs NEGATIVE    POSITIVE !  RPR NON-REACTIVE  REACTIVE ! REACTIVE ! REACTIVE !  RPR Titer  1:4 (H) 1:8 (H) 1:32 (H)  Fluorescent Treponemal ABS NON-REACTIVE  REACTIVE ! REACTIVE !   !: Data is abnormal (H): Data is abnormally high  Beryle Flock, RN

## 2022-05-09 NOTE — Telephone Encounter (Signed)
Patient aware and scheduled to come in for treatment tomorrow 2/28.   [place], CMA

## 2022-05-10 ENCOUNTER — Other Ambulatory Visit: Payer: Self-pay

## 2022-05-10 ENCOUNTER — Ambulatory Visit (INDEPENDENT_AMBULATORY_CARE_PROVIDER_SITE_OTHER): Payer: Self-pay

## 2022-05-10 DIAGNOSIS — A539 Syphilis, unspecified: Secondary | ICD-10-CM

## 2022-05-10 MED ORDER — PENICILLIN G BENZATHINE 1200000 UNIT/2ML IM SUSY
1.2000 10*6.[IU] | PREFILLED_SYRINGE | Freq: Once | INTRAMUSCULAR | Status: AC
  Administered 2022-05-10: 1.2 10*6.[IU] via INTRAMUSCULAR

## 2022-05-10 NOTE — Progress Notes (Signed)
Reviewed and verified allergies with patient. Patient tolerated Bicillin injections well. Reinforced abstinence until treatment completed plus an additional 10 days, offered condoms and encouraged use. Advised patient to notify sexual partners for testing and treatment. Patient verbalized understanding.   Beryle Flock, RN

## 2022-06-01 ENCOUNTER — Encounter: Payer: Self-pay | Admitting: Family

## 2022-06-01 ENCOUNTER — Ambulatory Visit (INDEPENDENT_AMBULATORY_CARE_PROVIDER_SITE_OTHER): Payer: Self-pay | Admitting: Family

## 2022-06-01 ENCOUNTER — Other Ambulatory Visit: Payer: Self-pay

## 2022-06-01 VITALS — BP 142/94 | HR 82 | Temp 97.6°F | Ht 74.0 in | Wt 290.0 lb

## 2022-06-01 DIAGNOSIS — E1165 Type 2 diabetes mellitus with hyperglycemia: Secondary | ICD-10-CM

## 2022-06-01 DIAGNOSIS — B20 Human immunodeficiency virus [HIV] disease: Secondary | ICD-10-CM

## 2022-06-01 DIAGNOSIS — I10 Essential (primary) hypertension: Secondary | ICD-10-CM

## 2022-06-01 NOTE — Patient Instructions (Addendum)
Nice to see you.  Continue to take your medication daily as prescribed.  Refills have been sent to the pharmacy.  Plan for follow up in 3 months or sooner if needed with lab work on the same day.  Have a great day and stay safe!  Diabetes Mellitus and Nutrition, Adult When you have diabetes, or diabetes mellitus, it is very important to have healthy eating habits because your blood sugar (glucose) levels are greatly affected by what you eat and drink. Eating healthy foods in the right amounts, at about the same times every day, can help you: Manage your blood glucose. Lower your risk of heart disease. Improve your blood pressure. Reach or maintain a healthy weight. What can affect my meal plan? Every person with diabetes is different, and each person has different needs for a meal plan. Your health care provider may recommend that you work with a dietitian to make a meal plan that is best for you. Your meal plan may vary depending on factors such as: The calories you need. The medicines you take. Your weight. Your blood glucose, blood pressure, and cholesterol levels. Your activity level. Other health conditions you have, such as heart or kidney disease. How do carbohydrates affect me? Carbohydrates, also called carbs, affect your blood glucose level more than any other type of food. Eating carbs raises the amount of glucose in your blood. It is important to know how many carbs you can safely have in each meal. This is different for every person. Your dietitian can help you calculate how many carbs you should have at each meal and for each snack. How does alcohol affect me? Alcohol can cause a decrease in blood glucose (hypoglycemia), especially if you use insulin or take certain diabetes medicines by mouth. Hypoglycemia can be a life-threatening condition. Symptoms of hypoglycemia, such as sleepiness, dizziness, and confusion, are similar to symptoms of having too much alcohol. Do not  drink alcohol if: Your health care provider tells you not to drink. You are pregnant, may be pregnant, or are planning to become pregnant. If you drink alcohol: Limit how much you have to: 0-1 drink a day for women. 0-2 drinks a day for men. Know how much alcohol is in your drink. In the [place] one drink equals one 12 oz bottle of beer (355 mL), one 5 oz glass of wine (148 mL), or one 1 oz glass of hard liquor (44 mL). Keep yourself hydrated with water, diet soda, or unsweetened iced tea. Keep in mind that regular soda, juice, and other mixers may contain a lot of sugar and must be counted as carbs. What are tips for following this plan?  Reading food labels Start by checking the serving size on the Nutrition Facts label of packaged foods and drinks. The number of calories and the amount of carbs, fats, and other nutrients listed on the label are based on one serving of the item. Many items contain more than one serving per package. Check the total grams (g) of carbs in one serving. Check the number of grams of saturated fats and trans fats in one serving. Choose foods that have a low amount or none of these fats. Check the number of milligrams (mg) of salt (sodium) in one serving. Most people should limit total sodium intake to less than 2,300 mg per day. Always check the nutrition information of foods labeled as "low-fat" or "nonfat." These foods may be higher in added sugar or refined carbs and should be  avoided. Talk to your dietitian to identify your daily goals for nutrients listed on the label. Shopping Avoid buying canned, pre-made, or processed foods. These foods tend to be high in fat, sodium, and added sugar. Shop around the outside edge of the grocery store. This is where you will most often find fresh fruits and vegetables, bulk grains, fresh meats, and fresh dairy products. Cooking Use low-heat cooking methods, such as baking, instead of high-heat cooking methods, such as deep  frying. Cook using healthy oils, such as olive, canola, or sunflower oil. Avoid cooking with butter, cream, or high-fat meats. Meal planning Eat meals and snacks regularly, preferably at the same times every day. Avoid going long periods of time without eating. Eat foods that are high in fiber, such as fresh fruits, vegetables, beans, and whole grains. Eat 4-6 oz (112-168 g) of lean protein each day, such as lean meat, chicken, fish, eggs, or tofu. One ounce (oz) (28 g) of lean protein is equal to: 1 oz (28 g) of meat, chicken, or fish. 1 egg.  cup (62 g) of tofu. Eat some foods each day that contain healthy fats, such as avocado, nuts, seeds, and fish. What foods should I eat? Fruits Berries. Apples. Oranges. Peaches. Apricots. Plums. Grapes. Mangoes. Papayas. Pomegranates. Kiwi. Cherries. Vegetables Leafy greens, including lettuce, spinach, kale, chard, collard greens, mustard greens, and cabbage. Beets. Cauliflower. Broccoli. Carrots. Green beans. Tomatoes. Peppers. Onions. Cucumbers. Brussels sprouts. Grains Whole grains, such as whole-wheat or whole-grain bread, crackers, tortillas, cereal, and pasta. Unsweetened oatmeal. Quinoa. Brown or wild rice. Meats and other proteins Seafood. Poultry without skin. Lean cuts of poultry and beef. Tofu. Nuts. Seeds. Dairy Low-fat or fat-free dairy products such as milk, yogurt, and cheese. The items listed above may not be a complete list of foods and beverages you can eat and drink. Contact a dietitian for more information. What foods should I avoid? Fruits Fruits canned with syrup. Vegetables Canned vegetables. Frozen vegetables with butter or cream sauce. Grains Refined white flour and flour products such as bread, pasta, snack foods, and cereals. Avoid all processed foods. Meats and other proteins Fatty cuts of meat. Poultry with skin. Breaded or fried meats. Processed meat. Avoid saturated fats. Dairy Full-fat yogurt, cheese, or  milk. Beverages Sweetened drinks, such as soda or iced tea. The items listed above may not be a complete list of foods and beverages you should avoid. Contact a dietitian for more information. Questions to ask a health care provider Do I need to meet with a certified diabetes care and education specialist? Do I need to meet with a dietitian? What number can I call if I have questions? When are the best times to check my blood glucose? Where to find more information: American Diabetes Association: diabetes.org Academy of Nutrition and Dietetics: eatright.[employer] of Diabetes and Digestive and Kidney Diseases: [employer]is Association of Diabetes Care & Education Specialists: diabeteseducator.org Summary It is important to have healthy eating habits because your blood sugar (glucose) levels are greatly affected by what you eat and drink. It is important to use alcohol carefully. A healthy meal plan will help you manage your blood glucose and lower your risk of heart disease. Your health care provider may recommend that you work with a dietitian to make a meal plan that is best for you. This information is not intended to replace advice given to you by your health care provider. Make sure you discuss any questions you have with your health care provider.  Document Revised: 10/01/2019 Document Reviewed: 10/01/2019 Elsevier Patient Education  [place].

## 2022-06-01 NOTE — Assessment & Plan Note (Signed)
Kalven continues to have poorly controlled diabetes secondary to being off metformin and poor dietary choices that are rich in carbohydrates. Provided nutrition education and basics of diabetes and glucose control. Continue current dose of metformin and Invokana. If no improvement in 3 months will consider addition of weekly injectable medication.

## 2022-06-01 NOTE — Assessment & Plan Note (Signed)
Blood pressure elevated today with no neurological/ophthalmologic signs/symptoms. Consider additional medication if blood pressure remains elevated above goal of 130/80. Continue current dose of lisinopril.

## 2022-06-01 NOTE — Assessment & Plan Note (Signed)
Alan Boyle continues to have well controlled virus with good adherence and tolerance to [employer]. Reviewed lab work. Continue current dose of Biktarvy. Will need to renew financial assistance after July 1st. Plan for follow up in 3 months or sooner if needed.

## 2022-06-01 NOTE — Progress Notes (Signed)
Brief Narrative   Patient ID: Alan Boyle, male    DOB: 03-Oct-1980, 42 y.o.   MRN: [id number]  Alan Boyle is a 42 year old African-American male with HIV disease with risk factor of MSM.  Initial viral load and CD4 count are unavailable.  Most recent genotype from 3/21 with no significant medication resistance mutations. Alan Boyle negative. No history of opportunistic infection. Previous ART history of a Atripla.    Subjective:    Chief Complaint  Patient presents with   Follow-up    HPI:  Alan Boyle is a 42 y.o. male with HIV disease and Type 2 diabetes presenting today for follow up.   Alan Boyle has had problems getting his metformin from the pharmacy which has been on order and continues to take his Invokana. Continues to take Biktarvy daily with no adverse side effects. A1c elevated from previous and has not been eating well including drinking a considerable amount of juice and soda and consuming a lot of fast food and simple carbohydrates. Having to urinate more at times when not taking his metformin. No numbness or tingling or changes in vision.   Denies fevers, chills, night sweats, headaches, changes in vision, neck pain/stiffness, nausea, diarrhea, vomiting, lesions or rashes.    No Known Allergies    Outpatient Medications Prior to Visit  Medication Sig Dispense Refill   bictegravir-emtricitabine-tenofovir AF (BIKTARVY) 50-200-25 MG TABS tablet Take 1 tablet by mouth daily. 30 tablet 3   canagliflozin (INVOKANA) 100 MG TABS tablet Take 1 tablet (100 mg total) by mouth daily before breakfast. 30 tablet 3   lisinopril (ZESTRIL) 20 MG tablet Take 1 tablet (20 mg total) by mouth daily. 30 tablet 3   metFORMIN (GLUCOPHAGE) 500 MG tablet Take 1 tablet by mouth twice daily with a meal (Patient not taking: Reported on 06/01/2022) 60 tablet 3   No facility-administered medications prior to visit.     Past Medical History:  Diagnosis Date   Diabetes mellitus     NO MEDS (FINANCIAL ISSUE)   HIV disease ([place])    Hypertension    NO MEDS (FINANCIAL ISSUE)   Rectal mass    Rectal pain    Syphilis      Past Surgical History:  Procedure Laterality Date   TUMOR EXCISION N/A 12/16/2012   Procedure: EXAM UNDER ANESTHESIA AND REMOVAL OF RECTAL MASS;  Surgeon: Edward Jolly, MD;  Location: [place];  Service: General;  Laterality: N/A;      Review of Systems  Constitutional:  Negative for appetite change, chills, fatigue, fever and unexpected weight change.  Eyes:  Negative for visual disturbance.       Negative for changes in vision.  Respiratory:  Negative for cough, chest tightness, shortness of breath and wheezing.   Cardiovascular:  Negative for chest pain, palpitations and leg swelling.  Gastrointestinal:  Negative for abdominal pain, constipation, diarrhea, nausea and vomiting.  Endocrine: Negative for polydipsia, polyphagia and polyuria.  Genitourinary:  Negative for dysuria, flank pain, frequency, genital sores, hematuria and urgency.  Skin:  Negative for rash.  Allergic/Immunologic: Negative for immunocompromised state.  Neurological:  Negative for dizziness, weakness, light-headedness and headaches.      Objective:    BP (!) 142/94   Pulse 82   Temp 97.6 F (36.4 C) (Oral)   Ht 6\' 2"  (1.88 m)   Wt 290 lb (131.5 kg)   SpO2 96%   BMI 37.23 kg/m  Nursing note and vital signs reviewed.  Physical Exam Constitutional:      General: He is not in acute distress.    Appearance: He is well-developed. He is obese.  Cardiovascular:     Rate and Rhythm: Normal rate and regular rhythm.     Heart sounds: Normal heart sounds.  Pulmonary:     Effort: Pulmonary effort is normal.     Breath sounds: Normal breath sounds.  Skin:    General: Skin is warm and dry.  Neurological:     Mental Status: He is alert and oriented to person, place, and time.  Psychiatric:        Behavior: Behavior normal.        Thought  Content: Thought content normal.        Judgment: Judgment normal.         06/01/2022   10:46 AM 01/10/2022    1:50 PM 10/03/2021    9:21 AM 06/30/2021   10:49 AM 03/15/2021   10:16 AM  Depression screen PHQ 2/9  Decreased Interest 0 0 0 0 0  Down, Depressed, Hopeless 0 0 0 0 0  PHQ - 2 Score 0 0 0 0 0       Assessment & Plan:    Patient Active Problem List   Diagnosis Date Noted   Healthcare maintenance 07/11/2019   Oral candidiasis 06/10/2019   Rectal lesion 06/10/2019   Tobacco use disorder 09/26/2013   HIV disease ([place])    Obese 08/15/2011   SYPHILIS 03/29/2010   AIDS (acquired immune deficiency syndrome) ([place]) 05/06/2009   Type 2 diabetes mellitus with hyperglycemia ([place]) 05/06/2009   DEPRESSION 05/06/2009   Essential hypertension 05/06/2009     Problem List Items Addressed This Visit       Cardiovascular and Mediastinum   Essential hypertension    Blood pressure elevated today with no neurological/ophthalmologic signs/symptoms. Consider additional medication if blood pressure remains elevated above goal of 130/80. Continue current dose of lisinopril.         Endocrine   Type 2 diabetes mellitus with hyperglycemia ([place]) - Primary    Alan Boyle continues to have poorly controlled diabetes secondary to being off metformin and poor dietary choices that are rich in carbohydrates. Provided nutrition education and basics of diabetes and glucose control. Continue current dose of metformin and Invokana. If no improvement in 3 months will consider addition of weekly injectable medication.         Other   HIV disease ([place])    Alan Boyle continues to have well controlled virus with good adherence and tolerance to [employer]. Reviewed lab work. Continue current dose of Biktarvy. Will need to renew financial assistance after July 1st. Plan for follow up in 3 months or sooner if needed.         I am having Alan Boyle maintain his Biktarvy, canagliflozin, lisinopril, and  metFORMIN.   Follow-up: Return in about 3 months (around 09/01/2022).   Terri Piedra, MSN, FNP-C Nurse Practitioner [facility] for [facility]ious Disease [place] number: [phone]

## 2022-07-12 IMAGING — DX DG KNEE COMPLETE 4+V*R*
4 series · 4 of 4 positions shown · non-contrast
Comparison: May 03, 2015.

CLINICAL DATA: Acute right knee pain.

EXAM:
RIGHT KNEE - COMPLETE 4+ VIEW

[knee ap]
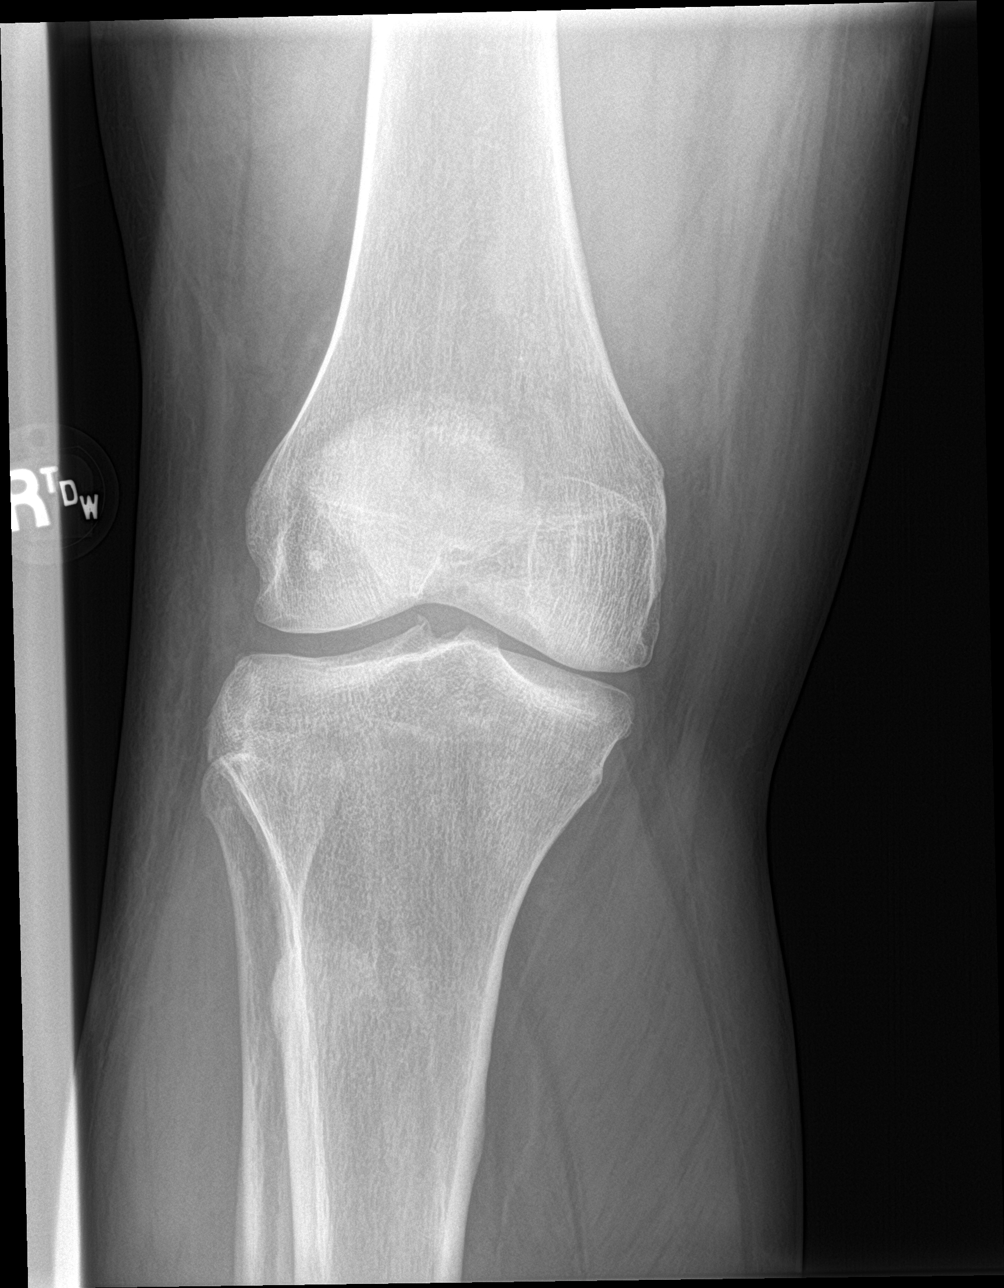

[knee obl (1 of 2)]
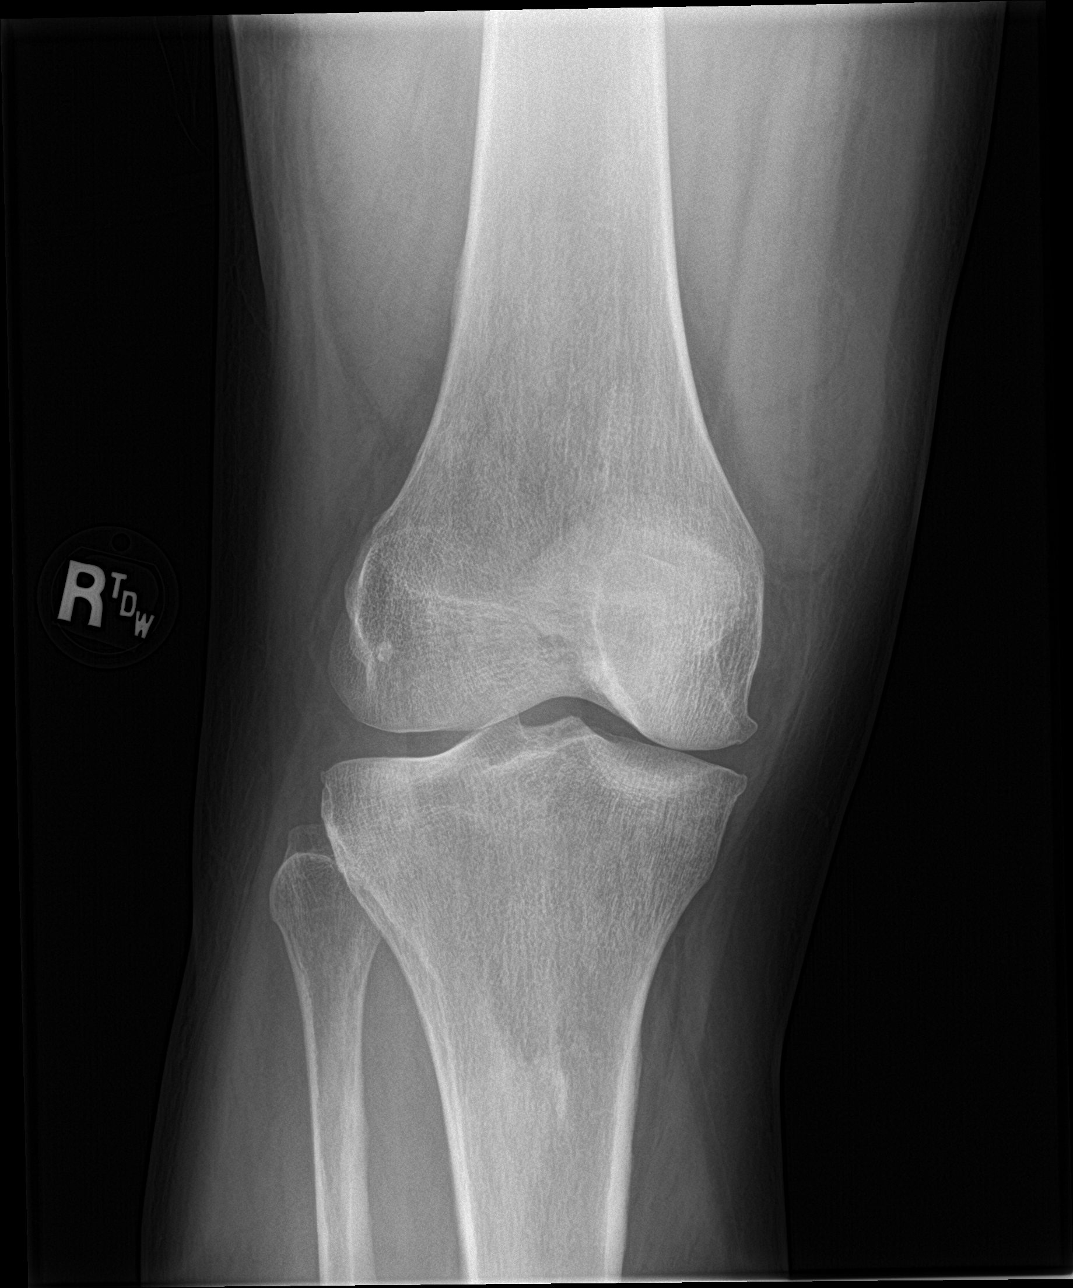

[knee lat]
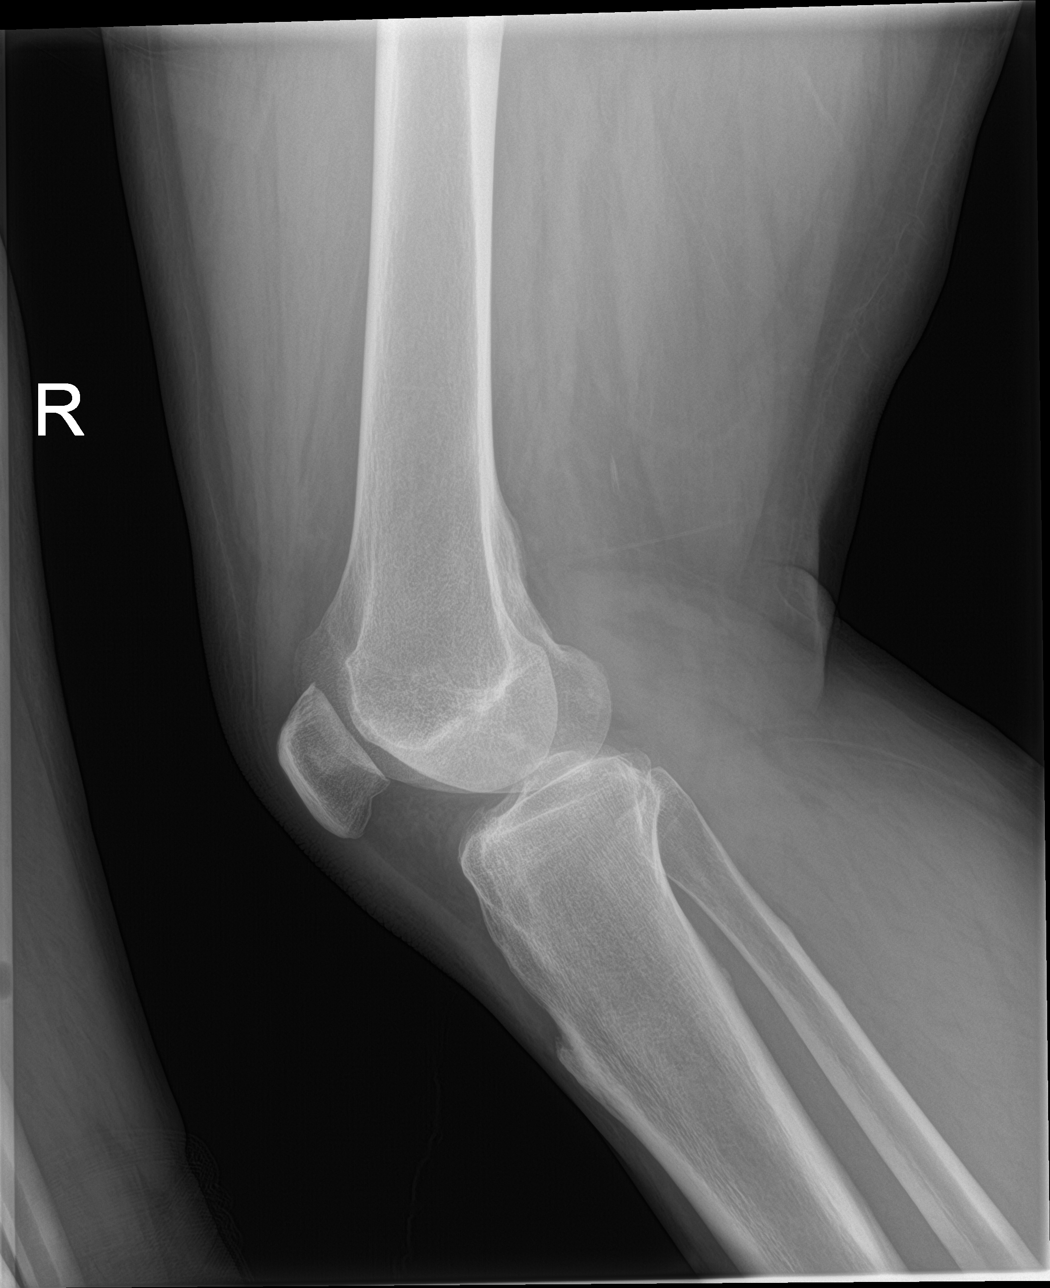

[knee obl (2 of 2)]
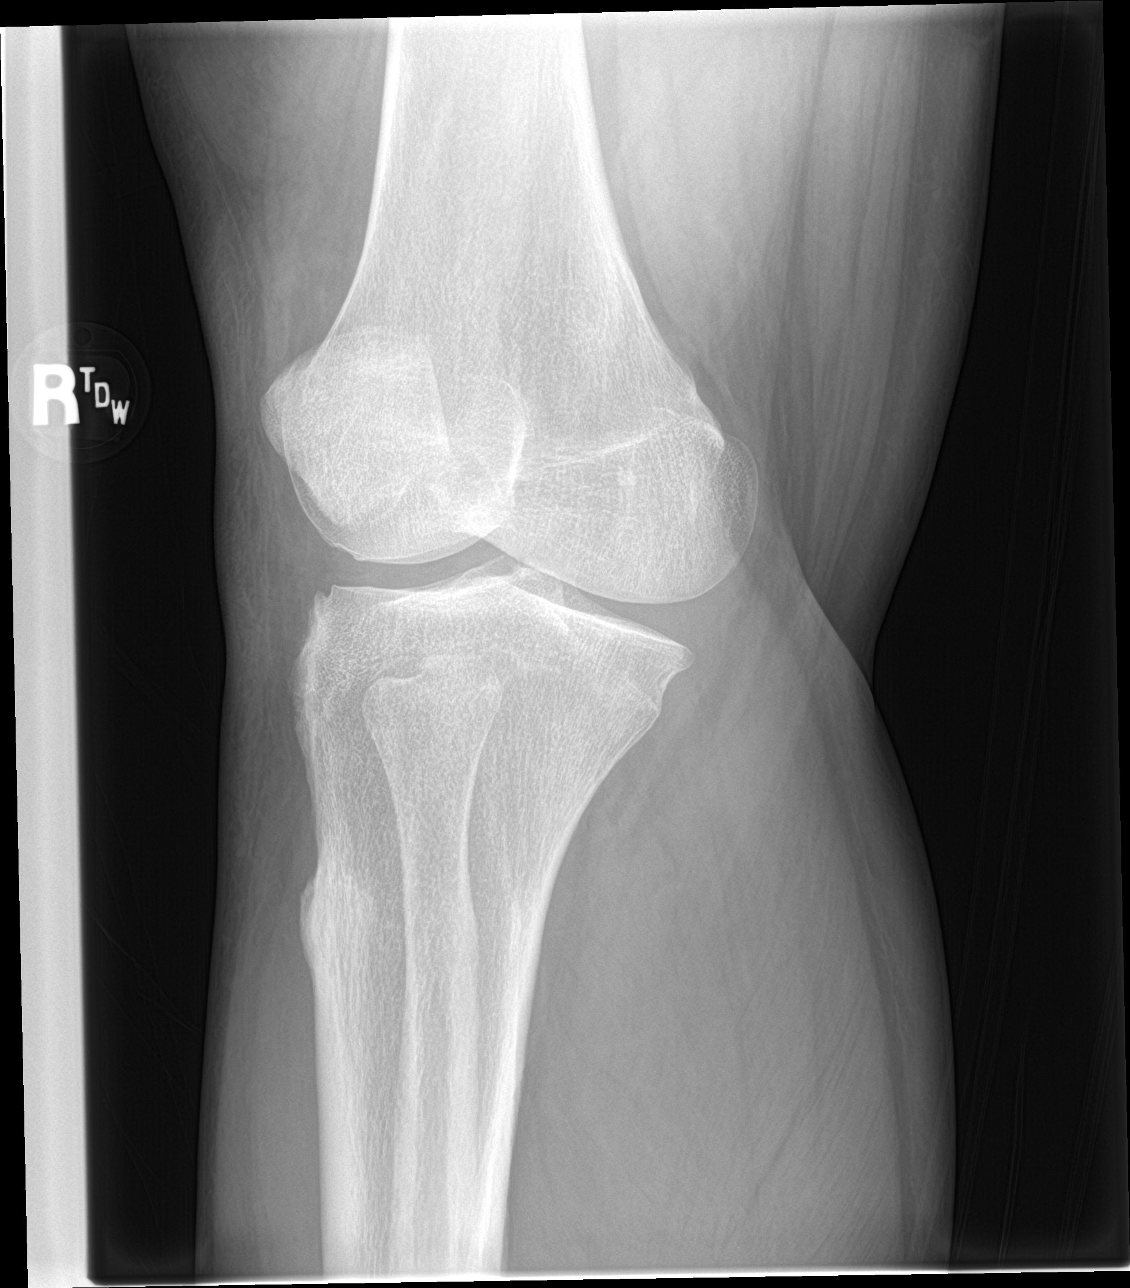

[4 of 4 positions shown; findings below may reference images not displayed]

FINDINGS: No evidence of fracture, dislocation, or joint effusion. Mild
degenerative changes seen involving medial joint space. Soft tissues
are unremarkable.
IMPRESSION: Mild degenerative joint disease. No acute abnormality seen in the
right knee.

## 2022-07-20 ENCOUNTER — Other Ambulatory Visit: Payer: Self-pay | Admitting: Family

## 2022-07-20 DIAGNOSIS — B20 Human immunodeficiency virus [HIV] disease: Secondary | ICD-10-CM

## 2022-08-18 ENCOUNTER — Other Ambulatory Visit: Payer: Self-pay

## 2022-08-18 DIAGNOSIS — E1165 Type 2 diabetes mellitus with hyperglycemia: Secondary | ICD-10-CM

## 2022-08-18 MED ORDER — METFORMIN HCL 500 MG PO TABS: ORAL_TABLET | ORAL | 1 refills | Status: DC

## 2022-08-21 NOTE — Progress Notes (Signed)
The 10-year ASCVD risk score (Arnett DK, et al., 2019) is: 24.3%   Values used to calculate the score:     Age: 42 years     Sex: Male     Is Non-Hispanic African American: Yes     Diabetic: Yes     Tobacco smoker: Yes     Systolic Blood Pressure: 142 mmHg     Is BP treated: Yes     HDL Cholesterol: 36 mg/dL     Total Cholesterol: 160 mg/dL  Sandie Ano, RN

## 2022-08-31 ENCOUNTER — Ambulatory Visit: Payer: Self-pay | Admitting: Family

## 2022-09-05 ENCOUNTER — Other Ambulatory Visit: Payer: Self-pay

## 2022-09-05 DIAGNOSIS — B20 Human immunodeficiency virus [HIV] disease: Secondary | ICD-10-CM

## 2022-09-05 DIAGNOSIS — E1165 Type 2 diabetes mellitus with hyperglycemia: Secondary | ICD-10-CM

## 2022-09-05 MED ORDER — CANAGLIFLOZIN 100 MG PO TABS: 100.0000 mg | ORAL_TABLET | Freq: Every day | ORAL | 1 refills | Status: DC

## 2022-09-05 MED ORDER — BIKTARVY 50-200-25 MG PO TABS: 1.0000 | ORAL_TABLET | Freq: Every day | ORAL | 5 refills | Status: DC

## 2022-09-05 MED ORDER — METFORMIN HCL 500 MG PO TABS: ORAL_TABLET | ORAL | 1 refills | Status: DC

## 2022-09-05 MED ORDER — LISINOPRIL 20 MG PO TABS: 20.0000 mg | ORAL_TABLET | Freq: Every day | ORAL | 1 refills | Status: DC

## 2022-09-20 ENCOUNTER — Ambulatory Visit: Payer: Self-pay | Admitting: Family

## 2022-10-09 ENCOUNTER — Ambulatory Visit: Payer: Self-pay

## 2022-10-09 ENCOUNTER — Encounter: Payer: Self-pay | Admitting: Family

## 2022-10-09 ENCOUNTER — Other Ambulatory Visit: Payer: Self-pay

## 2022-10-09 ENCOUNTER — Ambulatory Visit (INDEPENDENT_AMBULATORY_CARE_PROVIDER_SITE_OTHER): Payer: Self-pay | Admitting: Family

## 2022-10-09 VITALS — BP 139/95 | HR 85 | Temp 97.9°F | Resp 16 | Wt 292.0 lb

## 2022-10-09 DIAGNOSIS — F17200 Nicotine dependence, unspecified, uncomplicated: Secondary | ICD-10-CM

## 2022-10-09 DIAGNOSIS — A539 Syphilis, unspecified: Secondary | ICD-10-CM

## 2022-10-09 DIAGNOSIS — F17210 Nicotine dependence, cigarettes, uncomplicated: Secondary | ICD-10-CM

## 2022-10-09 DIAGNOSIS — Z0000 Encounter for general adult medical examination without abnormal findings: Secondary | ICD-10-CM

## 2022-10-09 DIAGNOSIS — I10 Essential (primary) hypertension: Secondary | ICD-10-CM

## 2022-10-09 DIAGNOSIS — Z7984 Long term (current) use of oral hypoglycemic drugs: Secondary | ICD-10-CM | POA: Diagnosis not present

## 2022-10-09 DIAGNOSIS — E1165 Type 2 diabetes mellitus with hyperglycemia: Secondary | ICD-10-CM

## 2022-10-09 DIAGNOSIS — B20 Human immunodeficiency virus [HIV] disease: Secondary | ICD-10-CM | POA: Diagnosis present

## 2022-10-09 DIAGNOSIS — Z9189 Other specified personal risk factors, not elsewhere classified: Secondary | ICD-10-CM

## 2022-10-09 MED ORDER — CANAGLIFLOZIN 100 MG PO TABS: 100.0000 mg | ORAL_TABLET | Freq: Every day | ORAL | 3 refills | Status: DC

## 2022-10-09 MED ORDER — LISINOPRIL 20 MG PO TABS: 20.0000 mg | ORAL_TABLET | Freq: Every day | ORAL | 3 refills | Status: DC

## 2022-10-09 MED ORDER — METFORMIN HCL 500 MG PO TABS
ORAL_TABLET | ORAL | 3 refills | Status: DC
Start: 2022-10-09 — End: 2023-03-08

## 2022-10-09 MED ORDER — ROSUVASTATIN CALCIUM 10 MG PO TABS: 10.0000 mg | ORAL_TABLET | Freq: Every day | ORAL | 5 refills | Status: DC

## 2022-10-09 MED ORDER — BIKTARVY 50-200-25 MG PO TABS
1.0000 | ORAL_TABLET | Freq: Every day | ORAL | 5 refills | Status: DC
Start: 2022-10-09 — End: 2022-12-20

## 2022-10-09 NOTE — Assessment & Plan Note (Addendum)
Mr. Finner blood pressure remains elevated above goal. No neurological or ophthalmologic signs symptoms. Consistent with medication. Consider increasing lisinopril if blood pressure continues to remain elevated.   BP Readings from Last 3 Encounters:  10/09/22 (!) 139/95  06/01/22 (!) 142/94  05/04/22 129/84

## 2022-10-09 NOTE — Assessment & Plan Note (Signed)
Discussed importance of safe sexual practice and condom use. Condoms and STD testing offered.  Declines vaccinations Dental clinic appointment information provided in AVS.

## 2022-10-09 NOTE — Assessment & Plan Note (Signed)
Recently found to have titer of 1:32 and new infection treated with 2.4 million units of Bicillin. Recheck RPR.

## 2022-10-09 NOTE — Assessment & Plan Note (Signed)
Mr. Felde is at increased risk for cardiovascular disease with multiple co-morbid conditions in addition to tobacco use. Start rosuvastatin to help decrease inflammation and reduce cardiovascular disease risk.

## 2022-10-09 NOTE — Progress Notes (Signed)
Brief Narrative   Patient ID: Alan Boyle, male    DOB: September 29, 1980, 42 y.o.   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  Initial viral load and CD4 count are unavailable.  Most recent genotype from 3/21 with no significant medication resistance mutations. [id number] negative. No history of opportunistic infection. Previous ART history of a Atripla.    Subjective:    Chief Complaint  Patient presents with   Follow-up    B20     HPI:  Alan Boyle is a 42 y.o. male with HIV disease, type 2 diabetes, and hypertension last seen on 06/01/2022 with well-controlled virus and poorly controlled diabetes.  Viral load was undetectable with CD4 count of 395.  Hemoglobin A1c 10.2.  Kidney function, liver function, electrolytes within normal ranges.  RPR testing positive for syphilis at 1: 32 and treated with 2,400,000 units of Bicillin once.  Here today for follow-up.  Alan Boyle has been doing okay since his last office visit and is having increased urination and questions if it is related to medication.  Continues to take all medications as prescribed with no adverse side effects or problems obtaining medication.  Will need to renew financial assistance. Not currently working and living with his mother to help support himself.  Condoms and STD testing offered. Declines vaccinations.   Denies fevers, chills, night sweats, headaches, changes in vision, neck pain/stiffness, nausea, diarrhea, vomiting, lesions or rashes.   No Known Allergies    Outpatient Medications Prior to Visit  Medication Sig Dispense Refill   bictegravir-emtricitabine-tenofovir AF (BIKTARVY) 50-200-25 MG TABS tablet Take 1 tablet by mouth daily. 30 tablet 5   canagliflozin (INVOKANA) 100 MG TABS tablet Take 1 tablet (100 mg total) by mouth daily before breakfast. 30 tablet 1   lisinopril (ZESTRIL) 20 MG tablet Take 1 tablet (20 mg total) by mouth daily.  30 tablet 1   metFORMIN (GLUCOPHAGE) 500 MG tablet Take 1 tablet by mouth twice daily with a meal 60 tablet 1   No facility-administered medications prior to visit.     Past Medical History:  Diagnosis Date   Diabetes mellitus    NO MEDS (FINANCIAL ISSUE)   HIV disease ([facility])    Hypertension    NO MEDS (FINANCIAL ISSUE)   Rectal mass    Rectal pain    Syphilis      Past Surgical History:  Procedure Laterality Date   TUMOR EXCISION N/A 12/16/2012   Procedure: EXAM UNDER ANESTHESIA AND REMOVAL OF RECTAL MASS;  Surgeon: Mariella Saa, MD;  Location: [facility];  Service: General;  Laterality: N/A;      Review of Systems  Constitutional:  Negative for appetite change, chills, fatigue, fever and unexpected weight change.  Eyes:  Negative for visual disturbance.  Respiratory:  Negative for cough, chest tightness, shortness of breath and wheezing.   Cardiovascular:  Negative for chest pain and leg swelling.  Gastrointestinal:  Negative for abdominal pain, constipation, diarrhea, nausea and vomiting.  Endocrine: Positive for polydipsia and polyuria. Negative for cold intolerance, heat intolerance and polyphagia.  Genitourinary:  Negative for dysuria, flank pain, frequency, genital sores, hematuria and urgency.  Skin:  Negative for rash.  Allergic/Immunologic: Negative for immunocompromised state.  Neurological:  Negative for dizziness and headaches.      Objective:    BP (!) 139/95   Pulse 85   Temp 97.9 F (36.6 C) (Oral)  Resp 16   Wt 292 lb (132.5 kg)   SpO2 98%   BMI 37.49 kg/m  Nursing note and vital signs reviewed.  Physical Exam Constitutional:      General: He is not in acute distress.    Appearance: He is well-developed.  Eyes:     Conjunctiva/sclera: Conjunctivae normal.  Cardiovascular:     Rate and Rhythm: Normal rate and regular rhythm.     Heart sounds: Normal heart sounds. No murmur heard.    No friction rub. No gallop.   Pulmonary:     Effort: Pulmonary effort is normal. No respiratory distress.     Breath sounds: Normal breath sounds. No wheezing or rales.  Chest:     Chest wall: No tenderness.  Abdominal:     General: Bowel sounds are normal.     Palpations: Abdomen is soft.     Tenderness: There is no abdominal tenderness.  Musculoskeletal:     Cervical back: Neck supple.  Lymphadenopathy:     Cervical: No cervical adenopathy.  Skin:    General: Skin is warm and dry.     Findings: No rash.  Neurological:     Mental Status: He is alert and oriented to person, place, and time.  Psychiatric:        Mood and Affect: Mood normal.         10/09/2022   10:20 AM 06/01/2022   10:46 AM 01/10/2022    1:50 PM 10/03/2021    9:21 AM 06/30/2021   10:49 AM  Depression screen PHQ 2/9  Decreased Interest 0 0 0 0 0  Down, Depressed, Hopeless 0 0 0 0 0  PHQ - 2 Score 0 0 0 0 0       Assessment & Plan:    Patient Active Problem List   Diagnosis Date Noted   At increased risk for cardiovascular disease 10/09/2022   Healthcare maintenance 07/11/2019   Oral candidiasis 06/10/2019   Rectal lesion 06/10/2019   Tobacco use disorder 09/26/2013   HIV disease ([facility])    Obese 08/15/2011   SYPHILIS 03/29/2010   AIDS (acquired immune deficiency syndrome) ([facility]) 05/06/2009   Type 2 diabetes mellitus with hyperglycemia ([facility]) 05/06/2009   DEPRESSION 05/06/2009   Essential hypertension 05/06/2009     Problem List Items Addressed This Visit       Cardiovascular and Mediastinum   Essential hypertension    Alan Boyle blood pressure remains elevated above goal. No neurological or ophthalmologic signs symptoms. Consistent with medication. Consider increasing lisinopril if blood pressure continues to remain elevated.   BP Readings from Last 3 Encounters:  10/09/22 (!) 139/95  06/01/22 (!) 142/94  05/04/22 129/84         Relevant Medications   lisinopril (ZESTRIL) 20 MG tablet   rosuvastatin (CRESTOR)  10 MG tablet     Endocrine   Type 2 diabetes mellitus with hyperglycemia [facility])    Alan Boyle has poorly controlled diabetes despite good adherence and tolerance to Metformin and Invokana. Will await financial assistance to determine availability of GLP-1 medications in addition to his current regimen which we will continue. Reviewed importance of blood sugar control and potential need for insulin if blood sugars are not improved. Will need diabetic eye exam and may need referral to Endocrinology. Check A1c. Follow up in 3 months or sooner if needed.       Relevant Medications   metFORMIN (GLUCOPHAGE) 500 MG tablet   lisinopril (ZESTRIL) 20 MG tablet  canagliflozin (INVOKANA) 100 MG TABS tablet   rosuvastatin (CRESTOR) 10 MG tablet   Other Relevant Orders   HgB A1c     Other   SYPHILIS    Recently found to have titer of 1:32 and new infection treated with 2.4 million units of Bicillin. Recheck RPR.       Relevant Medications   bictegravir-emtricitabine-tenofovir AF (BIKTARVY) 50-200-25 MG TABS tablet   Other Relevant Orders   RPR   HIV disease ([facility]) - Primary    Alan Boyle continues to have well controlled virus with good adherence and tolerance to Biktarvy. Reviewed previous lab work and discussed plan of care. Check lab work. Apply for Medicaid and renew financial assistance. Continue current dose of Biktarvy. Plan for follow up in 3 months or sooner if needed with lab work on the same day.       Relevant Medications   bictegravir-emtricitabine-tenofovir AF (BIKTARVY) 50-200-25 MG TABS tablet   Other Relevant Orders   BASIC METABOLIC PANEL WITH GFR   T-helper cell (CD4)- ([facility] clinic only)   HIV-1 RNA quant-no reflex-bld   Tobacco use disorder    Alan Boyle continues to smoke cigars on some days. Discussed importance of tobacco cessation to reduce risk of cardiovascular, respiratory, renal and malignant disease which are all elevated given his comorbidities. In the pre-contemplation  stage and not ready to quit at this time.       Healthcare maintenance    Discussed importance of safe sexual practice and condom use. Condoms and STD testing offered.  Declines vaccinations Dental clinic appointment information provided in AVS.       At increased risk for cardiovascular disease    Alan Boyle is at increased risk for cardiovascular disease with multiple co-morbid conditions in addition to tobacco use. Start rosuvastatin to help decrease inflammation and reduce cardiovascular disease risk.         I am having Wadie Lessen start on rosuvastatin. I am also having him maintain his metFORMIN, lisinopril, canagliflozin, and Biktarvy.   Meds ordered this encounter  Medications   metFORMIN (GLUCOPHAGE) 500 MG tablet    Sig: Take 1 tablet by mouth twice daily with a meal    Dispense:  60 tablet    Refill:  3    Order Specific Question:   Supervising Provider    Answer:   Drue Second, CYNTHIA [[id number]]   lisinopril (ZESTRIL) 20 MG tablet    Sig: Take 1 tablet (20 mg total) by mouth daily.    Dispense:  30 tablet    Refill:  3    Order Specific Question:   Supervising Provider    Answer:   Drue Second, CYNTHIA [[id number]]   canagliflozin (INVOKANA) 100 MG TABS tablet    Sig: Take 1 tablet (100 mg total) by mouth daily before breakfast.    Dispense:  30 tablet    Refill:  3    Order Specific Question:   Supervising Provider    Answer:   Drue Second, CYNTHIA [[id number]]   bictegravir-emtricitabine-tenofovir AF (BIKTARVY) 50-200-25 MG TABS tablet    Sig: Take 1 tablet by mouth daily.    Dispense:  30 tablet    Refill:  5    Order Specific Question:   Supervising Provider    Answer:   Drue Second, CYNTHIA [[id number]]   rosuvastatin (CRESTOR) 10 MG tablet    Sig: Take 1 tablet (10 mg total) by mouth daily.    Dispense:  30 tablet    Refill:  5  Order Specific Question:   Supervising Provider    Answer:   Judyann Munson [[id number]]     Follow-up: Return in about 3 months (around 01/09/2023), or if  symptoms worsen or fail to improve.   Marcos Eke, MSN, FNP-C Nurse Practitioner [facility] for Infectious Disease [facility] Main number: [phone]

## 2022-10-09 NOTE — Patient Instructions (Addendum)
Nice to see you.  We will check your lab work today.  Continue to take your medication daily as prescribed.  Refills have been sent to the pharmacy.  Please call [facility] ([facility]) to schedule/follow up on your dental care at [phone]  Plan for follow up in 3 months or sooner if needed with lab work on the same day.  Have a great day and stay safe!  

## 2022-10-09 NOTE — Assessment & Plan Note (Signed)
Alan Boyle has poorly controlled diabetes despite good adherence and tolerance to Metformin and Invokana. Will await financial assistance to determine availability of GLP-1 medications in addition to his current regimen which we will continue. Reviewed importance of blood sugar control and potential need for insulin if blood sugars are not improved. Will need diabetic eye exam and may need referral to Endocrinology. Check A1c. Follow up in 3 months or sooner if needed.

## 2022-10-09 NOTE — Assessment & Plan Note (Signed)
Alan Boyle continues to smoke cigars on some days. Discussed importance of tobacco cessation to reduce risk of cardiovascular, respiratory, renal and malignant disease which are all elevated given his comorbidities. In the pre-contemplation stage and not ready to quit at this time.

## 2022-10-09 NOTE — Assessment & Plan Note (Signed)
Mr. Herin continues to have well controlled virus with good adherence and tolerance to Biktarvy. Reviewed previous lab work and discussed plan of care. Check lab work. Apply for Medicaid and renew financial assistance. Continue current dose of Biktarvy. Plan for follow up in 3 months or sooner if needed with lab work on the same day.

## 2022-10-19 ENCOUNTER — Other Ambulatory Visit (HOSPITAL_COMMUNITY): Payer: Self-pay

## 2022-10-27 ENCOUNTER — Other Ambulatory Visit (HOSPITAL_COMMUNITY): Payer: Self-pay

## 2022-10-31 ENCOUNTER — Other Ambulatory Visit (HOSPITAL_COMMUNITY): Payer: Self-pay

## 2022-11-02 ENCOUNTER — Other Ambulatory Visit (HOSPITAL_COMMUNITY): Payer: Self-pay

## 2022-11-02 ENCOUNTER — Telehealth: Payer: Self-pay | Admitting: Pharmacy Technician

## 2022-11-02 NOTE — Telephone Encounter (Signed)
Pharmacy Patient Advocate Encounter   Received notification from Physician's Office that prior authorization for Trulicity is required/requested.   Insurance verification completed.   The patient is insured through [employer] .   Per test claim: PA required; PA started via CoverMyMeds. KEY [id number] initial request to [employer] . Waiting for clinical questions to populate.

## 2022-11-02 NOTE — Telephone Encounter (Addendum)
Questions populated and answers submitted. PA Case ID #: [id number]

## 2022-11-02 NOTE — Telephone Encounter (Signed)
Pharmacy Patient Advocate Encounter  Received notification from [facility] that Prior Authorization for Trulicity has been APPROVED from 11/02/22 to 11/02/23  PA #/Case ID/Reference #:  [id number]

## 2022-11-07 ENCOUNTER — Other Ambulatory Visit (HOSPITAL_COMMUNITY): Payer: Self-pay

## 2022-11-07 ENCOUNTER — Telehealth: Payer: Self-pay

## 2022-11-07 NOTE — Telephone Encounter (Signed)
Patient left vm that we need to call [facility] to discuss something in order to fill Invokona rx.  Called [facility] to discuss and I was told by RJ that Prior AUth is required. They are faxing that information over now.

## 2022-11-07 NOTE — Telephone Encounter (Signed)
PA started for Invokana.   Key: [id number]

## 2022-11-16 ENCOUNTER — Other Ambulatory Visit: Payer: Self-pay | Admitting: Family

## 2022-11-16 ENCOUNTER — Other Ambulatory Visit (HOSPITAL_COMMUNITY): Payer: Self-pay

## 2022-11-16 ENCOUNTER — Telehealth: Payer: Self-pay | Admitting: Pharmacy Technician

## 2022-11-16 DIAGNOSIS — E1165 Type 2 diabetes mellitus with hyperglycemia: Secondary | ICD-10-CM

## 2022-11-16 MED ORDER — CANAGLIFLOZIN 100 MG PO TABS: 100.0000 mg | ORAL_TABLET | Freq: Every day | ORAL | 3 refills | Status: DC

## 2022-11-16 MED ORDER — TRULICITY 0.75 MG/0.5ML ~~LOC~~ SOAJ: 0.7500 mg | SUBCUTANEOUS | 1 refills | Status: DC

## 2022-11-16 NOTE — Telephone Encounter (Signed)
Pharmacy Patient Advocate Encounter   Clinical questions populated. PA required; PA submitted to [employer] via CoverMyMeds Key/confirmation #/EOC [id number] Status is pending

## 2022-11-16 NOTE — Telephone Encounter (Signed)
Pharmacy Patient Advocate Encounter   Received notification from Physician's Office that prior authorization for Invokana is required/requested.   Insurance verification completed.   The patient is insured through [facility] .   Per test claim: PA required; PA started via CoverMyMeds. KEY  [id number] . Waiting for clinical questions to populate.   Previous PA denied, but chart notes weren't sent and questions were answered incorrectly. Trying to resubmit prior to doing an appeal.

## 2022-11-16 NOTE — Telephone Encounter (Signed)
Pharmacy Patient Advocate Encounter  Received notification from [facility] that Prior Authorization for Invokana has been APPROVED from 11/16/22 to 11/16/23. Ran test claim, Copay is $4. This test claim was processed through [facility]- copay amounts may vary at other pharmacies due to pharmacy/plan contracts, or as the patient moves through the different stages of their insurance plan. Ins will allow him to get a 90 day supply for $4. - same for Trulicity. 84 day supply for $4. If this is a better option for this pt.   PA #/Case ID/Reference #: PA Case ID #: [id number]

## 2022-12-20 ENCOUNTER — Other Ambulatory Visit (HOSPITAL_COMMUNITY)
Admission: RE | Admit: 2022-12-20 | Discharge: 2022-12-20 | Disposition: A | Discharge status: Home / Self Care | Payer: Medicaid Other | Source: Ambulatory Visit | Attending: Family | Admitting: Family

## 2022-12-20 ENCOUNTER — Ambulatory Visit (INDEPENDENT_AMBULATORY_CARE_PROVIDER_SITE_OTHER): Payer: Medicaid Other | Admitting: Family

## 2022-12-20 ENCOUNTER — Other Ambulatory Visit: Payer: Self-pay

## 2022-12-20 ENCOUNTER — Encounter: Payer: Self-pay | Admitting: Family

## 2022-12-20 VITALS — BP 169/106 | HR 95 | Temp 98.0°F | Ht 74.0 in | Wt 298.0 lb

## 2022-12-20 DIAGNOSIS — K047 Periapical abscess without sinus: Secondary | ICD-10-CM

## 2022-12-20 DIAGNOSIS — Z113 Encounter for screening for infections with a predominantly sexual mode of transmission: Secondary | ICD-10-CM | POA: Diagnosis present

## 2022-12-20 DIAGNOSIS — I10 Essential (primary) hypertension: Secondary | ICD-10-CM

## 2022-12-20 DIAGNOSIS — Z0000 Encounter for general adult medical examination without abnormal findings: Secondary | ICD-10-CM

## 2022-12-20 DIAGNOSIS — Z7985 Long-term (current) use of injectable non-insulin antidiabetic drugs: Secondary | ICD-10-CM

## 2022-12-20 DIAGNOSIS — E1165 Type 2 diabetes mellitus with hyperglycemia: Secondary | ICD-10-CM

## 2022-12-20 DIAGNOSIS — F17210 Nicotine dependence, cigarettes, uncomplicated: Secondary | ICD-10-CM

## 2022-12-20 DIAGNOSIS — Z7984 Long term (current) use of oral hypoglycemic drugs: Secondary | ICD-10-CM | POA: Diagnosis not present

## 2022-12-20 DIAGNOSIS — B20 Human immunodeficiency virus [HIV] disease: Secondary | ICD-10-CM

## 2022-12-20 MED ORDER — LISINOPRIL 40 MG PO TABS: 20.0000 mg | ORAL_TABLET | Freq: Every day | ORAL | 3 refills | Status: DC

## 2022-12-20 MED ORDER — BIKTARVY 50-200-25 MG PO TABS
1.0000 | ORAL_TABLET | Freq: Every day | ORAL | 5 refills | Status: DC
Start: 2022-12-20 — End: 2023-05-01

## 2022-12-20 MED ORDER — AMOXICILLIN-POT CLAVULANATE 875-125 MG PO TABS: 1.0000 | ORAL_TABLET | Freq: Two times a day (BID) | ORAL | 0 refills | Status: DC

## 2022-12-20 MED ORDER — ROSUVASTATIN CALCIUM 10 MG PO TABS: 10.0000 mg | ORAL_TABLET | Freq: Every day | ORAL | 5 refills | Status: DC

## 2022-12-20 NOTE — Assessment & Plan Note (Signed)
Alan Boyle is doing well with the addition of Trulicity to his regimen of metformin and canagliflozin. Check A1c. Continue current dose of metformin, canagliflozin, and Trulicity. Working on scheduling diabetic eye exam. Plan for follow up in 3 months or sooner if needed.

## 2022-12-20 NOTE — Assessment & Plan Note (Signed)
Discussed importance of safe sexual practice and condom use. Condoms and STD testing offered.  Vaccinations declined.  Anal pap discussed and declined today.

## 2022-12-20 NOTE — Patient Instructions (Addendum)
Nice to see you.  We will check your lab work today.  Continue to take your medication daily as prescribed.  Refills have been sent to the pharmacy.  Please call [facility] ([facility]) to schedule/follow up on your dental care at [phone]  Plan for follow up in 3 months or sooner if needed with lab work on the same day.  Have a great day and stay safe!  

## 2022-12-20 NOTE — Progress Notes (Signed)
Brief Narrative   Patient ID: Alan Boyle, male    DOB: Oct 17, 1980, 42 y.o.   MRN: [id number]  Mr. Alan Boyle is a 42 year old African-American male with HIV disease with risk factor of MSM.  Initial viral load and CD4 count are unavailable.  Most recent genotype from 3/21 with no significant medication resistance mutations. [id number] negative. No history of opportunistic infection. Previous ART history of a Atripla.    Subjective:    Chief Complaint  Patient presents with   Follow-up    Sti testing    HPI:  Alan Boyle is a 42 y.o. male with HIV disease, hypertension and diabetes last seen on 10/09/22 with well controlled virus and good adherence and tolerance to Biktarvy. Diabetes and hypertension poorly controlled. Viral load was undetectable and CD4 count 441. Kidney function, liver function and electrolytes within normal ranges. A1c was 10.5. Here today for follow up.  Mr. Alan Boyle has been doing well since his last office visit and has some concern about possible STD and having some mouth bleeding/dental issues that has been going on and off for the last few months and is working on finding a Education officer, community. Continues to take medications with addition of Trulicity as prescribed with no adverse side effects or problems obtaining medication. Condoms and STD testing offered. Reviewed vaccinations.   Denies fevers, chills, night sweats, headaches, changes in vision, neck pain/stiffness, nausea, diarrhea, vomiting, lesions or rashes.  Lab Results  Component Value Date   CD4TCELL 22 (L) 10/09/2022   CD4TABS 441 10/09/2022   Lab Results  Component Value Date   HIV1RNAQUANT Not Detected 10/09/2022     No Known Allergies    Outpatient Medications Prior to Visit  Medication Sig Dispense Refill   canagliflozin (INVOKANA) 100 MG TABS tablet Take 1 tablet (100 mg total) by mouth daily before breakfast. 30 tablet 3   Dulaglutide (TRULICITY) 0.75 MG/0.5ML SOPN Inject 0.75 mg into the  skin once a week. 6 mL 1   metFORMIN (GLUCOPHAGE) 500 MG tablet Take 1 tablet by mouth twice daily with a meal 60 tablet 3   bictegravir-emtricitabine-tenofovir AF (BIKTARVY) 50-200-25 MG TABS tablet Take 1 tablet by mouth daily. 30 tablet 5   lisinopril (ZESTRIL) 20 MG tablet Take 1 tablet (20 mg total) by mouth daily. 30 tablet 3   rosuvastatin (CRESTOR) 10 MG tablet Take 1 tablet (10 mg total) by mouth daily. 30 tablet 5   No facility-administered medications prior to visit.     Past Medical History:  Diagnosis Date   Diabetes mellitus    NO MEDS (FINANCIAL ISSUE)   HIV disease (HCC)    Hypertension    NO MEDS (FINANCIAL ISSUE)   Rectal mass    Rectal pain    Syphilis      Past Surgical History:  Procedure Laterality Date   TUMOR EXCISION N/A 12/16/2012   Procedure: EXAM UNDER ANESTHESIA AND REMOVAL OF RECTAL MASS;  Surgeon: Mariella Saa, MD;  Location: [facility];  Service: General;  Laterality: N/A;      Review of Systems  Constitutional:  Negative for appetite change, chills, fatigue, fever and unexpected weight change.  Eyes:  Negative for visual disturbance.  Respiratory:  Negative for cough, chest tightness, shortness of breath and wheezing.   Cardiovascular:  Negative for chest pain and leg swelling.  Gastrointestinal:  Negative for abdominal pain, constipation, diarrhea, nausea and vomiting.  Genitourinary:  Negative for dysuria, flank pain, frequency, genital sores, hematuria and  urgency.  Skin:  Negative for rash.  Allergic/Immunologic: Negative for immunocompromised state.  Neurological:  Negative for dizziness and headaches.      Objective:    BP (!) 169/106   Pulse 95   Temp 98 F (36.7 C) (Temporal)   Ht 6\' 2"  (1.88 m)   Wt 298 lb (135.2 kg)   SpO2 94%   BMI 38.26 kg/m  Nursing note and vital signs reviewed.  Physical Exam Constitutional:      General: He is not in acute distress.    Appearance: He is well-developed.   Eyes:     Conjunctiva/sclera: Conjunctivae normal.  Cardiovascular:     Rate and Rhythm: Normal rate and regular rhythm.     Heart sounds: Normal heart sounds. No murmur heard.    No friction rub. No gallop.  Pulmonary:     Effort: Pulmonary effort is normal. No respiratory distress.     Breath sounds: Normal breath sounds. No wheezing or rales.  Chest:     Chest wall: No tenderness.  Abdominal:     General: Bowel sounds are normal.     Palpations: Abdomen is soft.     Tenderness: There is no abdominal tenderness.  Musculoskeletal:     Cervical back: Neck supple.  Lymphadenopathy:     Cervical: No cervical adenopathy.  Skin:    General: Skin is warm and dry.     Findings: No rash.  Neurological:     Mental Status: He is alert and oriented to person, place, and time.  Psychiatric:        Behavior: Behavior normal.        Thought Content: Thought content normal.        Judgment: Judgment normal.         12/20/2022    9:49 AM 10/09/2022   10:20 AM 06/01/2022   10:46 AM 01/10/2022    1:50 PM 10/03/2021    9:21 AM  Depression screen PHQ 2/9  Decreased Interest 0 0 0 0 0  Down, Depressed, Hopeless 0 0 0 0 0  PHQ - 2 Score 0 0 0 0 0       Assessment & Plan:    Patient Active Problem List   Diagnosis Date Noted   Dental infection 12/20/2022   At increased risk for cardiovascular disease 10/09/2022   Healthcare maintenance 07/11/2019   Oral candidiasis 06/10/2019   Rectal lesion 06/10/2019   Tobacco use disorder 09/26/2013   HIV disease (HCC)    Obese 08/15/2011   SYPHILIS 03/29/2010   AIDS (acquired immune deficiency syndrome) (HCC) 05/06/2009   Type 2 diabetes mellitus with hyperglycemia (HCC) 05/06/2009   DEPRESSION 05/06/2009   Essential hypertension 05/06/2009     Problem List Items Addressed This Visit       Cardiovascular and Mediastinum   Essential hypertension    Blood pressure remains poorly controlled with current regimen. Discussed indications  for increasing medication. Fortunately does not have any neurological / ophthalmologic signs/symptoms. Increase lisinopril to 40 mg daily. Continue to monitor blood pressure as able.       Relevant Medications   lisinopril (ZESTRIL) 40 MG tablet   rosuvastatin (CRESTOR) 10 MG tablet     Digestive   Dental infection    Mr. Defusco has an ongoing dental issue with no clear abscess present. Will treat with short course of Augmentin while awaiting for dental follow up.       Relevant Medications   bictegravir-emtricitabine-tenofovir AF (BIKTARVY) 50-200-25 MG  TABS tablet     Endocrine   Type 2 diabetes mellitus with hyperglycemia [facility])    Mr. Kohlhoff is doing well with the addition of Trulicity to his regimen of metformin and canagliflozin. Check A1c. Continue current dose of metformin, canagliflozin, and Trulicity. Working on scheduling diabetic eye exam. Plan for follow up in 3 months or sooner if needed.       Relevant Medications   lisinopril (ZESTRIL) 40 MG tablet   rosuvastatin (CRESTOR) 10 MG tablet     Other   HIV disease (HCC) - Primary    Mr. Derck continues to have well controlled virus with good adherence and tolerance to Biktarvy.  Reviewed lab work and discussed plan of care, U equals U, and family planning. Check lab work. Continue current dose of Biktarvy. Plan for follow up in  3 months or sooner if needed with lab work on the same day.       Relevant Medications   bictegravir-emtricitabine-tenofovir AF (BIKTARVY) 50-200-25 MG TABS tablet   Other Relevant Orders   COMPLETE METABOLIC PANEL WITH GFR   HIV-1 RNA quant-no reflex-bld   T-helper cell (CD4)- ([facility] clinic only)   Healthcare maintenance    Discussed importance of safe sexual practice and condom use. Condoms and STD testing offered.  Vaccinations declined.  Anal pap discussed and declined today.       Other Visit Diagnoses     Screening for STDs (sexually transmitted diseases)       Relevant Orders    RPR   Cytology (oral, anal, urethral) ancillary only   Urine cytology ancillary only   Cytology (oral, anal, urethral) ancillary only        I have changed Donella Stade. Macy's lisinopril. I am also having him start on amoxicillin-clavulanate. Additionally, I am having him maintain his metFORMIN, canagliflozin, Trulicity, Biktarvy, and rosuvastatin.   Meds ordered this encounter  Medications   amoxicillin-clavulanate (AUGMENTIN) 875-125 MG tablet    Sig: Take 1 tablet by mouth 2 (two) times daily.    Dispense:  14 tablet    Refill:  0    Order Specific Question:   Supervising Provider    Answer:   Drue Second, CYNTHIA [[id number]]   lisinopril (ZESTRIL) 40 MG tablet    Sig: Take 0.5 tablets (20 mg total) by mouth daily.    Dispense:  30 tablet    Refill:  3    Order Specific Question:   Supervising Provider    Answer:   Drue Second, CYNTHIA [[id number]]   bictegravir-emtricitabine-tenofovir AF (BIKTARVY) 50-200-25 MG TABS tablet    Sig: Take 1 tablet by mouth daily.    Dispense:  30 tablet    Refill:  5    Order Specific Question:   Supervising Provider    Answer:   Drue Second, CYNTHIA [[id number]]   rosuvastatin (CRESTOR) 10 MG tablet    Sig: Take 1 tablet (10 mg total) by mouth daily.    Dispense:  30 tablet    Refill:  5    Order Specific Question:   Supervising Provider    Answer:   Judyann Munson [[id number]]     Follow-up: Return in about 3 months (around 03/22/2023), or if symptoms worsen or fail to improve. or sooner if needed.    Marcos Eke, MSN, FNP-C Nurse Practitioner [facility] for [facility] Main number: [phone]

## 2022-12-20 NOTE — Assessment & Plan Note (Signed)
Blood pressure remains poorly controlled with current regimen. Discussed indications for increasing medication. Fortunately does not have any neurological / ophthalmologic signs/symptoms. Increase lisinopril to 40 mg daily. Continue to monitor blood pressure as able.

## 2022-12-20 NOTE — Assessment & Plan Note (Signed)
Mr. Garron has an ongoing dental issue with no clear abscess present. Will treat with short course of Augmentin while awaiting for dental follow up.

## 2022-12-20 NOTE — Assessment & Plan Note (Signed)
Alan Boyle continues to have well controlled virus with good adherence and tolerance to Biktarvy.  Reviewed lab work and discussed plan of care, U equals U, and family planning. Check lab work. Continue current dose of Biktarvy. Plan for follow up in  3 months or sooner if needed with lab work on the same day.

## 2022-12-21 LAB — CYTOLOGY, (ORAL, ANAL, URETHRAL) ANCILLARY ONLY
Chlamydia: NEGATIVE
Chlamydia: NEGATIVE
Comment: NEGATIVE
Comment: NEGATIVE
Comment: NORMAL
Comment: NORMAL
Neisseria Gonorrhea: NEGATIVE
Neisseria Gonorrhea: NEGATIVE

## 2022-12-21 LAB — URINE CYTOLOGY ANCILLARY ONLY
Chlamydia: NEGATIVE
Comment: NEGATIVE
Comment: NORMAL
Neisseria Gonorrhea: NEGATIVE

## 2022-12-21 LAB — T-HELPER CELL (CD4) - (RCID CLINIC ONLY)
CD4 % Helper T Cell: 21 % — ABNORMAL LOW (ref 33–65)
CD4 T Cell Abs: 506 /uL (ref 400–1790)

## 2022-12-22 LAB — COMPLETE METABOLIC PANEL WITH GFR
AG Ratio: 1.4 (calc) (ref 1.0–2.5)
ALT: 12 U/L (ref 9–46)
AST: 11 U/L (ref 10–40)
Albumin: 4.2 g/dL (ref 3.6–5.1)
Alkaline phosphatase (APISO): 74 U/L (ref 36–130)
BUN: 10 mg/dL (ref 7–25)
CO2: 27 mmol/L (ref 20–32)
Calcium: 9.6 mg/dL (ref 8.6–10.3)
Chloride: 102 mmol/L (ref 98–110)
Creat: 1.08 mg/dL (ref 0.60–1.29)
Globulin: 3.1 g/dL (ref 1.9–3.7)
Glucose, Bld: 256 mg/dL — ABNORMAL HIGH (ref 65–99)
Potassium: 3.8 mmol/L (ref 3.5–5.3)
Sodium: 139 mmol/L (ref 135–146)
Total Bilirubin: 0.4 mg/dL (ref 0.2–1.2)
Total Protein: 7.3 g/dL (ref 6.1–8.1)
eGFR: 87 mL/min/{1.73_m2} (ref >=60)

## 2022-12-22 LAB — HIV-1 RNA QUANT-NO REFLEX-BLD
HIV 1 RNA Quant: <20 {copies}/mL — ABNORMAL HIGH
HIV-1 RNA Quant, Log: <1.3 {Log} — ABNORMAL HIGH

## 2022-12-22 LAB — RPR TITER: RPR Titer: 1:4 {titer} — ABNORMAL HIGH

## 2022-12-22 LAB — RPR: RPR Ser Ql: REACTIVE — AB

## 2022-12-22 LAB — T PALLIDUM AB: T Pallidum Abs: POSITIVE — AB

## 2023-01-09 ENCOUNTER — Ambulatory Visit: Payer: Self-pay | Admitting: Family

## 2023-03-05 ENCOUNTER — Other Ambulatory Visit: Payer: Self-pay | Admitting: Family

## 2023-03-05 DIAGNOSIS — E1165 Type 2 diabetes mellitus with hyperglycemia: Secondary | ICD-10-CM

## 2023-03-16 ENCOUNTER — Ambulatory Visit: Payer: Medicaid Other | Admitting: Family

## 2023-03-30 ENCOUNTER — Other Ambulatory Visit: Payer: Self-pay | Admitting: Family

## 2023-03-30 DIAGNOSIS — I10 Essential (primary) hypertension: Secondary | ICD-10-CM

## 2023-03-30 NOTE — Telephone Encounter (Signed)
Please advise, current Rx for lisinopril is 20 mg daily. Last office note mentions increasing to 40 mg daily. Thanks!

## 2023-04-12 ENCOUNTER — Ambulatory Visit: Payer: Medicaid Other | Admitting: Family

## 2023-05-01 ENCOUNTER — Encounter: Payer: Self-pay | Admitting: Family

## 2023-05-01 ENCOUNTER — Other Ambulatory Visit: Payer: Self-pay

## 2023-05-01 ENCOUNTER — Other Ambulatory Visit (HOSPITAL_COMMUNITY): Payer: Self-pay

## 2023-05-01 ENCOUNTER — Ambulatory Visit (INDEPENDENT_AMBULATORY_CARE_PROVIDER_SITE_OTHER): Payer: Medicaid Other | Admitting: Family

## 2023-05-01 VITALS — BP 149/101 | HR 89 | Temp 98.0°F | Resp 16 | Wt 298.2 lb

## 2023-05-01 DIAGNOSIS — F17200 Nicotine dependence, unspecified, uncomplicated: Secondary | ICD-10-CM

## 2023-05-01 DIAGNOSIS — Z7985 Long-term (current) use of injectable non-insulin antidiabetic drugs: Secondary | ICD-10-CM

## 2023-05-01 DIAGNOSIS — E1165 Type 2 diabetes mellitus with hyperglycemia: Secondary | ICD-10-CM | POA: Diagnosis not present

## 2023-05-01 DIAGNOSIS — B20 Human immunodeficiency virus [HIV] disease: Secondary | ICD-10-CM | POA: Diagnosis present

## 2023-05-01 DIAGNOSIS — A539 Syphilis, unspecified: Secondary | ICD-10-CM

## 2023-05-01 DIAGNOSIS — Z1212 Encounter for screening for malignant neoplasm of rectum: Secondary | ICD-10-CM

## 2023-05-01 DIAGNOSIS — Z7984 Long term (current) use of oral hypoglycemic drugs: Secondary | ICD-10-CM

## 2023-05-01 DIAGNOSIS — F17210 Nicotine dependence, cigarettes, uncomplicated: Secondary | ICD-10-CM

## 2023-05-01 DIAGNOSIS — Z0000 Encounter for general adult medical examination without abnormal findings: Secondary | ICD-10-CM

## 2023-05-01 DIAGNOSIS — I10 Essential (primary) hypertension: Secondary | ICD-10-CM

## 2023-05-01 MED ORDER — BIKTARVY 50-200-25 MG PO TABS: 1.0000 | ORAL_TABLET | Freq: Every day | ORAL | 5 refills | Status: DC

## 2023-05-01 NOTE — Patient Instructions (Addendum)
 Nice to see you.  We will check your lab work today.  Continue to take your medication daily as prescribed.  Refills have been sent to the pharmacy.  Please call [facility]) to schedule/follow up on your dental care at [phone]  Plan for follow up in 3 months or sooner if needed with lab work on the same day.  Have a great day and stay safe!

## 2023-05-01 NOTE — Progress Notes (Unsigned)
 Brief Narrative   Patient ID: Alan Boyle, male    DOB: April 26, 1980, 43 y.o.   MRN: [id number]  Mr. 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  Initial viral load and CD4 count are unavailable.  Most recent genotype from 3/21 with no significant medication resistance mutations. [id number] negative. No history of opportunistic infection. Previous ART history of a Atripla.    Subjective:    Chief Complaint  Patient presents with   Follow-up    B20 - patient reports he has forgetting to take Trulicity once per week.     HPI:  Alan Boyle is a 43 y.o. male with HIV disease last seen on   Not currently checking sugars at home  Denies fevers, chills, night sweats, headaches, changes in vision, neck pain/stiffness, nausea, diarrhea, vomiting, lesions or rashes.  Lab Results  Component Value Date   CD4TCELL 21 (L) 12/20/2022   CD4TABS 506 12/20/2022   Lab Results  Component Value Date   HIV1RNAQUANT <20 (H) 12/20/2022     No Known Allergies    Outpatient Medications Prior to Visit  Medication Sig Dispense Refill   bictegravir-emtricitabine-tenofovir AF (BIKTARVY) 50-200-25 MG TABS tablet Take 1 tablet by mouth daily. 30 tablet 5   canagliflozin (INVOKANA) 100 MG TABS tablet TAKE 1 TABLET(100 MG) BY MOUTH DAILY BEFORE BREAKFAST 30 tablet 0   Dulaglutide (TRULICITY) 0.75 MG/0.5ML SOPN Inject 0.75 mg into the skin once a week. 6 mL 1   lisinopril (ZESTRIL) 40 MG tablet Take 1 tablet (40 mg total) by mouth daily. 30 tablet 5   metFORMIN (GLUCOPHAGE) 500 MG tablet TAKE 1 TABLET BY MOUTH TWICE DAILY WITH A MEAL 60 tablet 0   rosuvastatin (CRESTOR) 10 MG tablet TAKE 1 TABLET(10 MG) BY MOUTH DAILY 30 tablet 5   amoxicillin-clavulanate (AUGMENTIN) 875-125 MG tablet Take 1 tablet by mouth 2 (two) times daily. (Patient not taking: Reported on 05/01/2023) 14 tablet 0   No facility-administered medications prior to visit.     Past  Medical History:  Diagnosis Date   Diabetes mellitus    NO MEDS (FINANCIAL ISSUE)   HIV disease (HCC)    Hypertension    NO MEDS (FINANCIAL ISSUE)   Rectal mass    Rectal pain    Syphilis      Past Surgical History:  Procedure Laterality Date   TUMOR EXCISION N/A 12/16/2012   Procedure: EXAM UNDER ANESTHESIA AND REMOVAL OF RECTAL MASS;  Surgeon: Mariella Saa, MD;  Location: [facility];  Service: General;  Laterality: N/A;      Review of Systems    Objective:    BP (!) 149/101   Pulse 89   Temp 98 F (36.7 C) (Oral)   Resp 16   Wt 298 lb 3.2 oz (135.3 kg)   SpO2 98%   BMI 38.29 kg/m  Nursing note and vital signs reviewed.  Physical Exam      05/01/2023   10:59 AM 12/20/2022    9:49 AM 10/09/2022   10:20 AM 06/01/2022   10:46 AM 01/10/2022    1:50 PM  Depression screen PHQ 2/9  Decreased Interest 0 0 0 0 0  Down, Depressed, Hopeless 0 0 0 0 0  PHQ - 2 Score 0 0 0 0 0       Assessment & Plan:    Patient Active Problem List   Diagnosis Date Noted   Dental infection 12/20/2022  At increased risk for cardiovascular disease 10/09/2022   Healthcare maintenance 07/11/2019   Oral candidiasis 06/10/2019   Rectal lesion 06/10/2019   Tobacco use disorder 09/26/2013   HIV disease (HCC)    Obese 08/15/2011   SYPHILIS 03/29/2010   AIDS (acquired immune deficiency syndrome) (HCC) 05/06/2009   Type 2 diabetes mellitus with hyperglycemia (HCC) 05/06/2009   DEPRESSION 05/06/2009   Essential hypertension 05/06/2009     Problem List Items Addressed This Visit   None    I am having Wadie Lessen maintain his Trulicity, amoxicillin-clavulanate, Biktarvy, metFORMIN, Invokana, rosuvastatin, and lisinopril.   No orders of the defined types were placed in this encounter.    Follow-up: No follow-ups on file. or sooner if needed.    Marcos Eke, MSN, FNP-C Nurse Practitioner [facility] for Infectious Disease [facility] Main number: [phone]

## 2023-05-02 ENCOUNTER — Encounter: Payer: Self-pay | Admitting: Family

## 2023-05-02 MED ORDER — CANAGLIFLOZIN 100 MG PO TABS: 100.0000 mg | ORAL_TABLET | Freq: Every day | ORAL | 3 refills | Status: DC

## 2023-05-02 MED ORDER — TRULICITY 1.5 MG/0.5ML ~~LOC~~ SOAJ: 1.5000 mg | SUBCUTANEOUS | 3 refills | Status: DC

## 2023-05-02 MED ORDER — AMLODIPINE BESYLATE 10 MG PO TABS: 10.0000 mg | ORAL_TABLET | Freq: Every day | ORAL | 11 refills | Status: DC

## 2023-05-02 MED ORDER — METFORMIN HCL ER 500 MG PO TB24: 500.0000 mg | ORAL_TABLET | Freq: Every day | ORAL | 3 refills | Status: DC

## 2023-05-02 NOTE — Assessment & Plan Note (Signed)
 Discussed importance of safe sexual practice and condom use. Condoms and site specific STD testing offered.  Vaccinations reviewed and declined.  Due for routine dental care and will schedule independently.  Anal pap for rectal cancer screening collected.  Discussed upcoming recommendation for colonoscopy this year.

## 2023-05-02 NOTE — Assessment & Plan Note (Signed)
 Mr. Alan Boyle continues to have well-controlled virus with good adherence and tolerance to Biktarvy.  Reviewed previous lab work and discussed plan of care and U equals U.  Check blood work.  Continue current dose of Biktarvy.  Covered by Medicaid.  Social determinants of health reviewed with no interventions indicated.  Plan for follow-up in 3 months or sooner if needed with lab work on the same day.

## 2023-05-02 NOTE — Assessment & Plan Note (Signed)
 Mr. Santone RPR titer is down to 1: 4 from treatment level 1: 32 with no sign/symptoms of current infection.  Check RPR.  Discussed nature of RPR testing.  Continue to monitor.

## 2023-05-02 NOTE — Assessment & Plan Note (Signed)
 Alan Boyle blood pressure is elevated above goal 130/80 with current dose of lisinopril.  No ophthalmologic or neurologic signs/symptoms.  Will add amlodipine given continued elevation in blood pressure. Continue current dose of lisinopril. Encouraged to follow low sodium and lower carbohydrate intake. Check blood pressure at home as able.

## 2023-05-02 NOTE — Assessment & Plan Note (Addendum)
 Alan Boyle continues to have poorly controlled type 2 diabetes with less than optimal adherence and good tolerance to his medication regimen of Invokana, metformin, and Trulicity.  Change metformin to extended release for once daily.  Continue current dose of Trulicity and Invokana.  Check blood work.  Due for diabetic eye exam and will work to up refer to ophthalmology or optometry.  May benefit from diabetes education and will check availability and interest. Renal function stable.  Plan for follow-up in 3 months or sooner if needed.

## 2023-05-02 NOTE — Assessment & Plan Note (Signed)
 Alan Boyle continues to smoke tobacco daily.  Discussed importance of tobacco cessation to reduce risk of disease development and complications in the future.  Methods include gums, patches, and medications.  Not ready to quit smoking at this time.

## 2023-05-04 LAB — COMPLETE METABOLIC PANEL WITH GFR
AG Ratio: 1.6 (calc) (ref 1.0–2.5)
ALT: 13 U/L (ref 9–46)
AST: 11 U/L (ref 10–40)
Albumin: 4.6 g/dL (ref 3.6–5.1)
Alkaline phosphatase (APISO): 68 U/L (ref 36–130)
BUN: 13 mg/dL (ref 7–25)
CO2: 28 mmol/L (ref 20–32)
Calcium: 9.7 mg/dL (ref 8.6–10.3)
Chloride: 102 mmol/L (ref 98–110)
Creat: 1.07 mg/dL (ref 0.60–1.29)
Globulin: 2.9 g/dL (ref 1.9–3.7)
Glucose, Bld: 191 mg/dL — ABNORMAL HIGH (ref 65–99)
Potassium: 4.2 mmol/L (ref 3.5–5.3)
Sodium: 140 mmol/L (ref 135–146)
Total Bilirubin: 0.3 mg/dL (ref 0.2–1.2)
Total Protein: 7.5 g/dL (ref 6.1–8.1)
eGFR: 88 mL/min/{1.73_m2} (ref >=60)

## 2023-05-04 LAB — T-HELPER CELLS (CD4) COUNT (NOT AT ARMC)
Absolute CD4: 598 {cells}/uL (ref 490–1740)
CD4 T Helper %: 22 % — ABNORMAL LOW (ref 30–61)
Total lymphocyte count: 2705 {cells}/uL (ref 850–3900)

## 2023-05-04 LAB — RPR: RPR Ser Ql: REACTIVE — AB

## 2023-05-04 LAB — CYTOLOGY - NON PAP

## 2023-05-04 LAB — RPR TITER: RPR Titer: 1:4 {titer} — ABNORMAL HIGH

## 2023-05-04 LAB — NON-GYN, SPECIMEN A

## 2023-05-04 LAB — HIV-1 RNA QUANT-NO REFLEX-BLD
HIV 1 RNA Quant: <20 {copies}/mL — ABNORMAL HIGH
HIV-1 RNA Quant, Log: <1.3 {Log} — ABNORMAL HIGH

## 2023-05-04 LAB — T PALLIDUM AB: T Pallidum Abs: POSITIVE — AB

## 2023-05-14 NOTE — Progress Notes (Unsigned)
   05/14/2023  HPI: Alan Boyle is a 43 y.o. male who presents to the [facility] clinic today for STI testing.  Patient Active Problem List   Diagnosis Date Noted   Dental infection 12/20/2022   At increased risk for cardiovascular disease 10/09/2022   Healthcare maintenance 07/11/2019   Oral candidiasis 06/10/2019   Rectal lesion 06/10/2019   Tobacco use disorder 09/26/2013   HIV disease (HCC)    Obese 08/15/2011   SYPHILIS 03/29/2010   AIDS (acquired immune deficiency syndrome) (HCC) 05/06/2009   Type 2 diabetes mellitus with hyperglycemia (HCC) 05/06/2009   DEPRESSION 05/06/2009   Essential hypertension 05/06/2009    Patient's Medications  New Prescriptions   No medications on file  Previous Medications   AMLODIPINE (NORVASC) 10 MG TABLET    Take 1 tablet (10 mg total) by mouth daily.   BICTEGRAVIR-EMTRICITABINE-TENOFOVIR AF (BIKTARVY) 50-200-25 MG TABS TABLET    Take 1 tablet by mouth daily.   CANAGLIFLOZIN (INVOKANA) 100 MG TABS TABLET    Take 1 tablet (100 mg total) by mouth daily before breakfast.   DULAGLUTIDE (TRULICITY) 1.5 MG/0.5ML SOAJ    Inject 1.5 mg into the skin once a week.   LISINOPRIL (ZESTRIL) 40 MG TABLET    Take 1 tablet (40 mg total) by mouth daily.   METFORMIN (GLUCOPHAGE-XR) 500 MG 24 HR TABLET    Take 1 tablet (500 mg total) by mouth daily with breakfast.   ROSUVASTATIN (CRESTOR) 10 MG TABLET    TAKE 1 TABLET(10 MG) BY MOUTH DAILY  Modified Medications   No medications on file  Discontinued Medications   No medications on file    Assessment: Alan Boyle presents today for STI testing. Last saw Tammy Sours on 05/01/23 for management of his HIV infection. Currently takes Biktarvy with no issues. Last HIV RNA was undetectable in February appointment.   Plan: - STI screening: RPR, urine/rectal/pharyngeal GC/CT swabs for cytology today - F/u results to see if treatment is needed  Alan Boyle, PharmD, BCIDP, AAHIVP, CPP Clinical Pharmacist  Practitioner Infectious Diseases Clinical Pharmacist [facility] 05/14/2023, 3:37 PM

## 2023-05-15 ENCOUNTER — Ambulatory Visit (INDEPENDENT_AMBULATORY_CARE_PROVIDER_SITE_OTHER): Admitting: Pharmacist

## 2023-05-15 ENCOUNTER — Other Ambulatory Visit (HOSPITAL_COMMUNITY)
Admission: RE | Admit: 2023-05-15 | Discharge: 2023-05-15 | Disposition: A | Discharge status: Home / Self Care | Source: Ambulatory Visit | Attending: Family | Admitting: Family

## 2023-05-15 ENCOUNTER — Other Ambulatory Visit: Payer: Self-pay

## 2023-05-15 DIAGNOSIS — B009 Herpesviral infection, unspecified: Secondary | ICD-10-CM | POA: Diagnosis not present

## 2023-05-15 DIAGNOSIS — Z113 Encounter for screening for infections with a predominantly sexual mode of transmission: Secondary | ICD-10-CM | POA: Diagnosis present

## 2023-05-15 MED ORDER — VALACYCLOVIR HCL 1 G PO TABS: 1000.0000 mg | ORAL_TABLET | Freq: Two times a day (BID) | ORAL | 0 refills | Status: AC

## 2023-05-16 LAB — URINE CYTOLOGY ANCILLARY ONLY
Chlamydia: NEGATIVE
Comment: NEGATIVE
Comment: NORMAL
Neisseria Gonorrhea: NEGATIVE

## 2023-05-16 LAB — CYTOLOGY, (ORAL, ANAL, URETHRAL) ANCILLARY ONLY
Chlamydia: NEGATIVE
Comment: NEGATIVE
Comment: NORMAL
Neisseria Gonorrhea: NEGATIVE

## 2023-05-17 LAB — RPR: RPR Ser Ql: REACTIVE — AB

## 2023-05-17 LAB — T PALLIDUM AB: T Pallidum Abs: POSITIVE — AB

## 2023-05-17 LAB — RPR TITER: RPR Titer: 1:4 {titer} — ABNORMAL HIGH

## 2023-05-21 ENCOUNTER — Other Ambulatory Visit: Payer: Self-pay | Admitting: Family

## 2023-05-21 NOTE — Telephone Encounter (Signed)
 Refills were sent 05/02/23

## 2023-07-26 NOTE — Progress Notes (Signed)
 The 10-year ASCVD risk score (Arnett DK, et al., 2019) is: 27.6%   Values used to calculate the score:     Age: 43 years     Sex: Male     Is Non-Hispanic African American: Yes     Diabetic: Yes     Tobacco smoker: Yes     Systolic Blood Pressure: 149 mmHg     Is BP treated: Yes     HDL Cholesterol: 36 mg/dL     Total Cholesterol: 160 mg/dL  Currently prescribed rosuvastatin  10 mg.   Tinna Kolker, BSN, RN

## 2023-07-30 ENCOUNTER — Ambulatory Visit: Payer: Medicaid Other | Admitting: Family

## 2023-07-31 ENCOUNTER — Other Ambulatory Visit: Payer: Self-pay | Admitting: Family

## 2023-07-31 DIAGNOSIS — I10 Essential (primary) hypertension: Secondary | ICD-10-CM

## 2023-08-20 ENCOUNTER — Other Ambulatory Visit: Payer: Self-pay | Admitting: Family

## 2023-08-23 ENCOUNTER — Other Ambulatory Visit: Payer: Self-pay

## 2023-08-23 ENCOUNTER — Other Ambulatory Visit: Payer: Self-pay | Admitting: Family

## 2023-08-23 ENCOUNTER — Ambulatory Visit (INDEPENDENT_AMBULATORY_CARE_PROVIDER_SITE_OTHER): Admitting: Family

## 2023-08-23 ENCOUNTER — Encounter: Payer: Self-pay | Admitting: Family

## 2023-08-23 VITALS — BP 125/81 | HR 90 | Temp 96.3°F | Ht 74.0 in | Wt 284.0 lb

## 2023-08-23 DIAGNOSIS — Z9189 Other specified personal risk factors, not elsewhere classified: Secondary | ICD-10-CM

## 2023-08-23 DIAGNOSIS — Z7984 Long term (current) use of oral hypoglycemic drugs: Secondary | ICD-10-CM | POA: Diagnosis not present

## 2023-08-23 DIAGNOSIS — F17200 Nicotine dependence, unspecified, uncomplicated: Secondary | ICD-10-CM | POA: Diagnosis not present

## 2023-08-23 DIAGNOSIS — A539 Syphilis, unspecified: Secondary | ICD-10-CM | POA: Diagnosis not present

## 2023-08-23 DIAGNOSIS — I10 Essential (primary) hypertension: Secondary | ICD-10-CM

## 2023-08-23 DIAGNOSIS — Z0000 Encounter for general adult medical examination without abnormal findings: Secondary | ICD-10-CM

## 2023-08-23 DIAGNOSIS — B20 Human immunodeficiency virus [HIV] disease: Secondary | ICD-10-CM | POA: Diagnosis present

## 2023-08-23 DIAGNOSIS — E1165 Type 2 diabetes mellitus with hyperglycemia: Secondary | ICD-10-CM | POA: Diagnosis not present

## 2023-08-23 LAB — COMPREHENSIVE METABOLIC PANEL WITH GFR
ALT: 10 U/L (ref 9–46)
AST: 12 U/L (ref 10–40)
Alkaline phosphatase (APISO): 64 U/L (ref 36–130)
Chloride: 104 mmol/L (ref 98–110)
Globulin: 2.8 g/dL (ref 1.9–3.7)
Potassium: 4 mmol/L (ref 3.5–5.3)
Total Bilirubin: 0.4 mg/dL (ref 0.2–1.2)

## 2023-08-23 MED ORDER — BIKTARVY 50-200-25 MG PO TABS: 1.0000 | ORAL_TABLET | Freq: Every day | ORAL | 5 refills | Status: AC

## 2023-08-23 MED ORDER — METFORMIN HCL ER 500 MG PO TB24: 500.0000 mg | ORAL_TABLET | Freq: Every day | ORAL | 3 refills | Status: DC

## 2023-08-23 MED ORDER — INVOKANA 100 MG PO TABS: 100.0000 mg | ORAL_TABLET | Freq: Every day | ORAL | 3 refills | Status: DC

## 2023-08-23 MED ORDER — LISINOPRIL 40 MG PO TABS: 40.0000 mg | ORAL_TABLET | Freq: Every day | ORAL | 1 refills | Status: DC

## 2023-08-23 MED ORDER — TRULICITY 1.5 MG/0.5ML ~~LOC~~ SOAJ: 1.5000 mg | SUBCUTANEOUS | 3 refills | Status: DC

## 2023-08-23 MED ORDER — AMLODIPINE BESYLATE 10 MG PO TABS: 10.0000 mg | ORAL_TABLET | Freq: Every day | ORAL | 1 refills | Status: DC

## 2023-08-23 MED ORDER — ROSUVASTATIN CALCIUM 10 MG PO TABS: 10.0000 mg | ORAL_TABLET | Freq: Every day | ORAL | 1 refills | Status: DC

## 2023-08-23 NOTE — Progress Notes (Signed)
 Brief Narrative   Patient ID: Alan Boyle, male    DOB: 1980/05/02, 43 y.o.   MRN: [id number]  Alan Boyle is a 43 year old African-American male with HIV disease with risk factor of MSM.  Initial viral load and CD4 count are unavailable.  Most recent genotype from 3/21 with no significant medication resistance mutations. [id number] negative. No history of opportunistic infection. Previous ART history of a Atripla.   Subjective:   Chief Complaint  Patient presents with   Follow-up    B20    HPI:  Alan Boyle is a 43 y.o. male with HIV disease last seen on 05/01/2023 with well-controlled virus and good adherence and tolerance to Biktarvy .  Viral load was undetectable with CD4 count 598.  RPR titer 1: 4 down from treatment level 1: 32.  Kidney function, liver function, electrolytes within normal ranges.  Blood sugar elevated at 191.  Here today for routine follow-up.  Alan Boyle has been doing well since his last office visit and continues to take his medications as prescribed with no adverse side effects or problems obtaining medication from the pharmacy.  Covered by Medicaid.  Does not currently check blood sugars at home.  He is in the interview process and looking at starting a new job in the near future.  Housing, access to food, and transportation are stable.  Currently taking care of his mother and uses her car for transportation.  No new concerns/complaints.  Condoms and site-specific STD testing offered.  Healthcare maintenance reviewed. Working on getting a new job; Medicaid; moms car; basic   Denies fevers, chills, night sweats, headaches, changes in vision, neck pain/stiffness, nausea, diarrhea, vomiting, lesions or rashes.  Lab Results  Component Value Date   CD4TCELL 22 (L) 05/01/2023   CD4TABS 506 12/20/2022   Lab Results  Component Value Date   HIV1RNAQUANT <20 (H) 05/01/2023     No Known Allergies    Outpatient Medications Prior to Visit  Medication Sig  Dispense Refill   amLODipine  (NORVASC ) 10 MG tablet Take 1 tablet (10 mg total) by mouth daily. 30 tablet 11   bictegravir-emtricitabine-tenofovir  AF (BIKTARVY ) 50-200-25 MG TABS tablet Take 1 tablet by mouth daily. 30 tablet 5   canagliflozin  (INVOKANA ) 100 MG TABS tablet Take 1 tablet (100 mg total) by mouth daily before breakfast. 30 tablet 3   Dulaglutide  (TRULICITY ) 1.5 MG/0.5ML SOAJ Inject 1.5 mg into the skin once a week. 2 mL 3   lisinopril  (ZESTRIL ) 40 MG tablet TAKE 1 TABLET(40 MG) BY MOUTH DAILY 90 tablet 0   metFORMIN  (GLUCOPHAGE -XR) 500 MG 24 hr tablet Take 1 tablet (500 mg total) by mouth daily with breakfast. 30 tablet 3   rosuvastatin  (CRESTOR ) 10 MG tablet TAKE 1 TABLET(10 MG) BY MOUTH DAILY 30 tablet 5   No facility-administered medications prior to visit.     Past Medical History:  Diagnosis Date   Diabetes mellitus    NO MEDS (FINANCIAL ISSUE)   HIV disease (HCC)    Hypertension    NO MEDS (FINANCIAL ISSUE)   Rectal mass    Rectal pain    Syphilis      Past Surgical History:  Procedure Laterality Date   TUMOR EXCISION N/A 12/16/2012   Procedure: EXAM UNDER ANESTHESIA AND REMOVAL OF RECTAL MASS;  Surgeon: Quitman Bucy, MD;  Location: [facility];  Service: General;  Laterality: N/A;        Review of Systems  Constitutional:  Negative for appetite  change, chills, fatigue, fever and unexpected weight change.  Eyes:  Negative for visual disturbance.  Respiratory:  Negative for cough, chest tightness, shortness of breath and wheezing.   Cardiovascular:  Negative for chest pain and leg swelling.  Gastrointestinal:  Negative for abdominal pain, constipation, diarrhea, nausea and vomiting.  Genitourinary:  Negative for dysuria, flank pain, frequency, genital sores, hematuria and urgency.  Skin:  Negative for rash.  Allergic/Immunologic: Negative for immunocompromised state.  Neurological:  Negative for dizziness and headaches.      Objective:   BP 125/81   Pulse 90   Temp (!) 96.3 F (35.7 C) (Temporal)   Ht 6' 2 (1.88 m)   Wt 284 lb (128.8 kg)   SpO2 98%   BMI 36.46 kg/m  Nursing note and vital signs reviewed.  Physical Exam Constitutional:      General: He is not in acute distress.    Appearance: He is well-developed.   Eyes:     Conjunctiva/sclera: Conjunctivae normal.    Cardiovascular:     Rate and Rhythm: Normal rate and regular rhythm.     Heart sounds: Normal heart sounds. No murmur heard.    No friction rub. No gallop.  Pulmonary:     Effort: Pulmonary effort is normal. No respiratory distress.     Breath sounds: Normal breath sounds. No wheezing or rales.  Chest:     Chest wall: No tenderness.  Abdominal:     General: Bowel sounds are normal.     Palpations: Abdomen is soft.     Tenderness: There is no abdominal tenderness.   Musculoskeletal:     Cervical back: Neck supple.  Lymphadenopathy:     Cervical: No cervical adenopathy.   Skin:    General: Skin is warm and dry.     Findings: No rash.   Neurological:     Mental Status: He is alert and oriented to person, place, and time.   Psychiatric:        Behavior: Behavior normal.        Thought Content: Thought content normal.        Judgment: Judgment normal.          08/23/2023    3:37 PM 05/01/2023   10:59 AM 12/20/2022    9:49 AM 10/09/2022   10:20 AM 06/01/2022   10:46 AM  Depression screen PHQ 2/9  Decreased Interest 0 0 0 0 0  Down, Depressed, Hopeless 0 0 0 0 0  PHQ - 2 Score 0 0 0 0 0  Altered sleeping 0      Tired, decreased energy 0      Change in appetite 0      Feeling bad or failure about yourself  0      Trouble concentrating 0      Moving slowly or fidgety/restless 0      Suicidal thoughts 0      PHQ-9 Score 0            08/23/2023    3:36 PM  GAD 7 : Generalized Anxiety Score  Nervous, Anxious, on Edge 0  Control/stop worrying 0  Worry too much - different things 0  Trouble relaxing 0   Restless 0  Easily annoyed or irritable 0  Afraid - awful might happen 0  Total GAD 7 Score 0     The 10-year ASCVD risk score (Arnett DK, et al., 2019) is: 20.6%   Values used to calculate the score:  Age: 43 years     Clincally relevant sex: Male     Is Non-Hispanic African American: Yes     Diabetic: Yes     Tobacco smoker: Yes     Systolic Blood Pressure: 125 mmHg     Is BP treated: Yes     HDL Cholesterol: 36 mg/dL     Total Cholesterol: 160 mg/dL      Assessment & Plan:    Patient Active Problem List   Diagnosis Date Noted   Dental infection 12/20/2022   At increased risk for cardiovascular disease 10/09/2022   Healthcare maintenance 07/11/2019   Oral candidiasis 06/10/2019   Rectal lesion 06/10/2019   Tobacco use disorder 09/26/2013   HIV disease (HCC)    Obese 08/15/2011   SYPHILIS 03/29/2010   AIDS (acquired immune deficiency syndrome) (HCC) 05/06/2009   Type 2 diabetes mellitus with hyperglycemia (HCC) 05/06/2009   DEPRESSION 05/06/2009   Essential hypertension 05/06/2009     Problem List Items Addressed This Visit       Cardiovascular and Mediastinum   Essential hypertension   Blood pressure borderline adequate control with current dose of lisinopril  and amlodipine .  No adverse side effects.  No neurologic or ophthalmologic signs/symptoms.  Encouraged to follow low-sodium/Dash type diet.  Monitor blood pressure at home as able.  Continue current dose of amlodipine  and lisinopril .      Relevant Medications   amLODipine  (NORVASC ) 10 MG tablet   lisinopril  (ZESTRIL ) 40 MG tablet   rosuvastatin  (CRESTOR ) 10 MG tablet     Endocrine   Type 2 diabetes mellitus with hyperglycemia [facility])   Alan Boyle continues to have poorly controlled diabetes with good adherence and tolerance to his current medication regimen.  Discussed importance of a low carbohydrate intake and possible referral to nutrition therapy.  Check A1c, urine microalbumin, and blood sugar.   Continue current dose of metformin , Trulicity , and Invokana .  May need referral to endocrinology if blood sugars do not come down.  He will schedule a diabetic eye exam independently.      Relevant Medications   canagliflozin  (INVOKANA ) 100 MG TABS tablet   Dulaglutide  (TRULICITY ) 1.5 MG/0.5ML SOAJ   lisinopril  (ZESTRIL ) 40 MG tablet   metFORMIN  (GLUCOPHAGE -XR) 500 MG 24 hr tablet   rosuvastatin  (CRESTOR ) 10 MG tablet   Other Relevant Orders   Comprehensive metabolic panel with GFR   Urine Microalbumin w/creat. ratio   HgB A1c     Other   SYPHILIS   Alan Boyle RPR titer continues to decrease slowly now down to 1: 4 from treatment level of 1: 32.  No current signs/symptoms concerning for infection.  Discussed nature of RPR testing.  Check RPR.      Relevant Medications   bictegravir-emtricitabine-tenofovir  AF (BIKTARVY ) 50-200-25 MG TABS tablet   Other Relevant Orders   RPR   HIV disease (HCC) - Primary   Alan Boyle continues to have well-controlled virus with good adherence and tolerance to Biktarvy .  Reviewed previous lab work and discussed plan of care and U equals U.  Continue current dose of Biktarvy .  Social determinants of health reviewed with no interventions indicated.  Covered by Medicaid with no problems obtaining medication from the pharmacy.  Check lab work.  Plan for follow-up in 3 months or sooner if needed.      Relevant Medications   bictegravir-emtricitabine-tenofovir  AF (BIKTARVY ) 50-200-25 MG TABS tablet   Other Relevant Orders   Comprehensive metabolic panel with GFR   HIV-1 RNA quant-no reflex-bld  T-helper cells (CD4) count (not at [facility])   Tobacco use disorder   Alan Boyle continues to smoke tobacco on occasion. Counseled on the dangers of tobacco not ready to quit at this time.  Reviewed strategies to maximize success, including removing cigarettes and smoking materials from environment, stress management, substitution of other forms of reinforcement,  support of family/friends, and written materials.        Healthcare maintenance   Discussed importance of safe sexual practice and condom use. Condoms and site specific STD testing offered.  Vaccinations reviewed and following counseling declined. Due for diabetic eye exam which she will schedule independently. Due for routine dental care which she will schedule independently. Due for colon cancer screening with colonoscopy and will follow-up when ready. Due for annual cancer screening deferred at this time.      At increased risk for cardiovascular disease   Alan Boyle is at increased risk for cardiovascular disease with a 10-year ASCVD risk score of 25.2%.  Currently on rosuvastatin .  Encouraged tobacco cessation.  Adjust modifiable conditions.  Continue current dose of rosuvastatin  to reduce risk of cardiovascular disease and HIV associated inflammation.        I have changed Alan Boyle's lisinopril  and rosuvastatin . I am also having him maintain his amLODipine , Biktarvy , Invokana , Trulicity , and metFORMIN .   Meds ordered this encounter  Medications   amLODipine  (NORVASC ) 10 MG tablet    Sig: Take 1 tablet (10 mg total) by mouth daily.    Dispense:  90 tablet    Refill:  1    Supervising Provider:   SNIDER, CYNTHIA [[id number]]   bictegravir-emtricitabine-tenofovir  AF (BIKTARVY ) 50-200-25 MG TABS tablet    Sig: Take 1 tablet by mouth daily.    Dispense:  30 tablet    Refill:  5    Supervising Provider:   SNIDER, CYNTHIA [phone]    Prescription Type::   Renewal   canagliflozin  (INVOKANA ) 100 MG TABS tablet    Sig: Take 1 tablet (100 mg total) by mouth daily before breakfast.    Dispense:  30 tablet    Refill:  3    Supervising Provider:   SNIDER, CYNTHIA [[id number]]   Dulaglutide  (TRULICITY ) 1.5 MG/0.5ML SOAJ    Sig: Inject 1.5 mg into the skin once a week.    Dispense:  2 mL    Refill:  3    Supervising Provider:   SNIDER, CYNTHIA [[id number]]   lisinopril  (ZESTRIL ) 40 MG tablet     Sig: Take 1 tablet (40 mg total) by mouth daily.    Dispense:  90 tablet    Refill:  1    **Patient requests 90 days supply**    Supervising Provider:   SNIDER, CYNTHIA [[id number]]   metFORMIN  (GLUCOPHAGE -XR) 500 MG 24 hr tablet    Sig: Take 1 tablet (500 mg total) by mouth daily with breakfast.    Dispense:  30 tablet    Refill:  3    Supervising Provider:   SNIDER, CYNTHIA [[id number]]   rosuvastatin  (CRESTOR ) 10 MG tablet    Sig: Take 1 tablet (10 mg total) by mouth daily.    Dispense:  90 tablet    Refill:  1    Supervising Provider:   Liane Redman [[id number]]     Follow-up: Return in about 3 months (around 11/23/2023). or sooner if needed.    Marlan Silva, MSN, FNP-C Nurse Practitioner [facility] Main number: [phone]

## 2023-08-23 NOTE — Assessment & Plan Note (Signed)
 Alan Boyle continues to smoke tobacco on occasion. Counseled on the dangers of tobacco not ready to quit at this time.  Reviewed strategies to maximize success, including removing cigarettes and smoking materials from environment, stress management, substitution of other forms of reinforcement, support of family/friends, and written materials.

## 2023-08-23 NOTE — Assessment & Plan Note (Signed)
 Mr. Oros RPR titer continues to decrease slowly now down to 1: 4 from treatment level of 1: 32.  No current signs/symptoms concerning for infection.  Discussed nature of RPR testing.  Check RPR.

## 2023-08-23 NOTE — Assessment & Plan Note (Signed)
 Discussed importance of safe sexual practice and condom use. Condoms and site specific STD testing offered.  Vaccinations reviewed and following counseling declined. Due for diabetic eye exam which she will schedule independently. Due for routine dental care which she will schedule independently. Due for colon cancer screening with colonoscopy and will follow-up when ready. Due for annual cancer screening deferred at this time.

## 2023-08-23 NOTE — Assessment & Plan Note (Signed)
 Alan Boyle is at increased risk for cardiovascular disease with a 10-year ASCVD risk score of 25.2%.  Currently on rosuvastatin .  Encouraged tobacco cessation.  Adjust modifiable conditions.  Continue current dose of rosuvastatin  to reduce risk of cardiovascular disease and HIV associated inflammation.

## 2023-08-23 NOTE — Assessment & Plan Note (Signed)
 Mr. Laurent continues to have well-controlled virus with good adherence and tolerance to Biktarvy .  Reviewed previous lab work and discussed plan of care and U equals U.  Continue current dose of Biktarvy .  Social determinants of health reviewed with no interventions indicated.  Covered by Medicaid with no problems obtaining medication from the pharmacy.  Check lab work.  Plan for follow-up in 3 months or sooner if needed.

## 2023-08-23 NOTE — Patient Instructions (Addendum)
 Nice to see you.  We will check your lab work today.  Continue to take your medication daily as prescribed.  Refills have been sent to the pharmacy.  Plan for follow up in 3 months or sooner if needed with lab work on the same day.  Have a great day and stay safe!

## 2023-08-23 NOTE — Assessment & Plan Note (Signed)
 Blood pressure borderline adequate control with current dose of lisinopril  and amlodipine .  No adverse side effects.  No neurologic or ophthalmologic signs/symptoms.  Encouraged to follow low-sodium/Dash type diet.  Monitor blood pressure at home as able.  Continue current dose of amlodipine  and lisinopril .

## 2023-08-23 NOTE — Assessment & Plan Note (Signed)
 Alan Boyle continues to have poorly controlled diabetes with good adherence and tolerance to his current medication regimen.  Discussed importance of a low carbohydrate intake and possible referral to nutrition therapy.  Check A1c, urine microalbumin, and blood sugar.  Continue current dose of metformin , Trulicity , and Invokana .  May need referral to endocrinology if blood sugars do not come down.  He will schedule a diabetic eye exam independently.

## 2023-08-24 LAB — MICROALBUMIN / CREATININE URINE RATIO
Creatinine, Urine: 55 mg/dL (ref 20–320)
Microalb, Ur: <0.2 mg/dL

## 2023-08-25 LAB — COMPREHENSIVE METABOLIC PANEL WITH GFR
AG Ratio: 1.4 (calc) (ref 1.0–2.5)
Albumin: 3.9 g/dL (ref 3.6–5.1)
BUN: 11 mg/dL (ref 7–25)
CO2: 28 mmol/L (ref 20–32)
Calcium: 8.9 mg/dL (ref 8.6–10.3)
Creat: 1.12 mg/dL (ref 0.60–1.29)
Glucose, Bld: 195 mg/dL — ABNORMAL HIGH (ref 65–99)
Sodium: 140 mmol/L (ref 135–146)
Total Protein: 6.7 g/dL (ref 6.1–8.1)
eGFR: 83 mL/min/{1.73_m2} (ref >=60)

## 2023-08-25 LAB — HEMOGLOBIN A1C
Hgb A1c MFr Bld: 8.4 % — ABNORMAL HIGH (ref <5.7)
Mean Plasma Glucose: 194 mg/dL
eAG (mmol/L): 10.8 mmol/L

## 2023-08-25 LAB — HIV-1 RNA QUANT-NO REFLEX-BLD
HIV 1 RNA Quant: <20 {copies}/mL — AB
HIV-1 RNA Quant, Log: <1.3 {Log_copies}/mL — AB

## 2023-08-25 LAB — RPR TITER: RPR Titer: 1:2 {titer} — ABNORMAL HIGH

## 2023-08-25 LAB — T-HELPER CELLS (CD4) COUNT (NOT AT ARMC)
Absolute CD4: 741 {cells}/uL (ref 490–1740)
CD4 T Helper %: 24 % — ABNORMAL LOW (ref 30–61)
Total lymphocyte count: 3072 {cells}/uL (ref 850–3900)

## 2023-08-25 LAB — RPR: RPR Ser Ql: REACTIVE — AB

## 2023-08-25 LAB — T PALLIDUM AB: T Pallidum Abs: POSITIVE — AB

## 2023-08-27 ENCOUNTER — Ambulatory Visit: Payer: Self-pay | Admitting: Family

## 2023-09-24 ENCOUNTER — Ambulatory Visit (INDEPENDENT_AMBULATORY_CARE_PROVIDER_SITE_OTHER): Admitting: Pharmacist

## 2023-09-24 ENCOUNTER — Other Ambulatory Visit (HOSPITAL_COMMUNITY)
Admission: RE | Admit: 2023-09-24 | Discharge: 2023-09-24 | Disposition: A | Discharge status: Home / Self Care | Source: Ambulatory Visit | Attending: Family | Admitting: Family

## 2023-09-24 ENCOUNTER — Other Ambulatory Visit: Payer: Self-pay

## 2023-09-24 DIAGNOSIS — Z113 Encounter for screening for infections with a predominantly sexual mode of transmission: Secondary | ICD-10-CM

## 2023-09-24 NOTE — Progress Notes (Signed)
   HPI: Alan Boyle is a 43 y.o. male who presents to the [facility] clinic today for STI testing.  Patient Active Problem List   Diagnosis Date Noted   Dental infection 12/20/2022   At increased risk for cardiovascular disease 10/09/2022   Healthcare maintenance 07/11/2019   Oral candidiasis 06/10/2019   Rectal lesion 06/10/2019   Tobacco use disorder 09/26/2013   HIV disease (HCC)    Obese 08/15/2011   SYPHILIS 03/29/2010   AIDS (acquired immune deficiency syndrome) (HCC) 05/06/2009   Type 2 diabetes mellitus with hyperglycemia (HCC) 05/06/2009   DEPRESSION 05/06/2009   Essential hypertension 05/06/2009    Patient's Medications  New Prescriptions   No medications on file  Previous Medications   AMLODIPINE  (NORVASC ) 10 MG TABLET    Take 1 tablet (10 mg total) by mouth daily.   BICTEGRAVIR-EMTRICITABINE-TENOFOVIR  AF (BIKTARVY ) 50-200-25 MG TABS TABLET    Take 1 tablet by mouth daily.   CANAGLIFLOZIN  (INVOKANA ) 100 MG TABS TABLET    Take 1 tablet (100 mg total) by mouth daily before breakfast.   DULAGLUTIDE  (TRULICITY ) 1.5 MG/0.5ML SOAJ    Inject 1.5 mg into the skin once a week.   LISINOPRIL  (ZESTRIL ) 40 MG TABLET    Take 1 tablet (40 mg total) by mouth daily.   METFORMIN  (GLUCOPHAGE -XR) 500 MG 24 HR TABLET    Take 1 tablet (500 mg total) by mouth daily with breakfast.   ROSUVASTATIN  (CRESTOR ) 10 MG TABLET    Take 1 tablet (10 mg total) by mouth daily.  Modified Medications   No medications on file  Discontinued Medications   No medications on file    Assessment: Alan Boyle is here today for STI testing. He recently saw Cathlyn July, NP in June for management of his HIV. He takes Biktarvy  and is undetectable. Last STI testing was in March 2025 and was negative.   He recently had a sexual encounter with an ex-boyfriend and started having white discharge, itchiness, and pain in his rectum. He has occasional diarrhea but attributes this to eating [employer]/fast food. No other symptoms.  Elected to screen and wait on treatment until results come back. He is available any day this week to come back in if needed.   Plan: - STI screening: RPR, urine/rectal/pharyngeal GC/CT swabs for cytology today - F/u results to see if treatment is needed - F/u with Cathlyn on 11/19/23 for HIV management  Eusevio Schriver L. Alajah Witman, PharmD, BCIDP, AAHIVP, CPP Clinical Pharmacist Practitioner - Infectious Diseases Clinical Pharmacist Lead - Specialty Pharmacy [facility] for Infectious Disease 09/24/2023, 9:21 AM

## 2023-09-25 LAB — CYTOLOGY, (ORAL, ANAL, URETHRAL) ANCILLARY ONLY
Chlamydia: NEGATIVE
Chlamydia: NEGATIVE
Comment: NEGATIVE
Comment: NEGATIVE
Comment: NORMAL
Comment: NORMAL
Neisseria Gonorrhea: NEGATIVE
Neisseria Gonorrhea: NEGATIVE

## 2023-09-25 LAB — URINE CYTOLOGY ANCILLARY ONLY
Chlamydia: NEGATIVE
Comment: NEGATIVE
Comment: NORMAL
Neisseria Gonorrhea: NEGATIVE

## 2023-09-26 ENCOUNTER — Ambulatory Visit: Payer: Self-pay | Admitting: Pharmacist

## 2023-09-27 LAB — SYPHILIS: RPR W/REFLEX TO RPR TITER AND TREPONEMAL ANTIBODIES, TRADITIONAL SCREENING AND DIAGNOSIS ALGORITHM: RPR Ser Ql: REACTIVE — AB

## 2023-09-27 LAB — RPR TITER: RPR Titer: 1:4 {titer} — ABNORMAL HIGH

## 2023-09-27 LAB — T PALLIDUM AB: T Pallidum Abs: POSITIVE — AB

## 2023-11-05 ENCOUNTER — Telehealth: Payer: Self-pay

## 2023-11-05 NOTE — Telephone Encounter (Signed)
 Pharmacy Patient Advocate Encounter   Received notification from [employer] that prior authorization for [name] is required/requested.   Insurance verification completed.   The patient is insured through [facility] .   Per test claim: PA required; PA submitted to above mentioned insurance via [employer] Key/confirmation #/EOC [id number] Status is pending

## 2023-11-07 ENCOUNTER — Telehealth: Payer: Self-pay

## 2023-11-07 ENCOUNTER — Other Ambulatory Visit (HOSPITAL_COMMUNITY): Payer: Self-pay

## 2023-11-07 NOTE — Telephone Encounter (Signed)
 Pharmacy Patient Advocate Encounter  Received notification from [facility] MEDICAID that Prior Authorization for [facility] has been DENIED.  Full denial letter will be uploaded to the media tab. See denial reason below.   PA #/Case ID/Reference #: [id number]

## 2023-11-08 ENCOUNTER — Other Ambulatory Visit: Payer: Self-pay

## 2023-11-08 ENCOUNTER — Encounter (HOSPITAL_COMMUNITY): Payer: Self-pay

## 2023-11-08 ENCOUNTER — Emergency Department (HOSPITAL_COMMUNITY)
Admission: EM | Admit: 2023-11-08 | Discharge: 2023-11-09 | Disposition: A | Discharge status: Home / Self Care | Attending: Emergency Medicine | Admitting: Emergency Medicine

## 2023-11-08 DIAGNOSIS — I10 Essential (primary) hypertension: Secondary | ICD-10-CM | POA: Diagnosis not present

## 2023-11-08 DIAGNOSIS — Z79899 Other long term (current) drug therapy: Secondary | ICD-10-CM | POA: Insufficient documentation

## 2023-11-08 DIAGNOSIS — Z7984 Long term (current) use of oral hypoglycemic drugs: Secondary | ICD-10-CM | POA: Insufficient documentation

## 2023-11-08 DIAGNOSIS — Z794 Long term (current) use of insulin: Secondary | ICD-10-CM | POA: Insufficient documentation

## 2023-11-08 DIAGNOSIS — E119 Type 2 diabetes mellitus without complications: Secondary | ICD-10-CM | POA: Insufficient documentation

## 2023-11-08 DIAGNOSIS — M545 Low back pain, unspecified: Secondary | ICD-10-CM | POA: Insufficient documentation

## 2023-11-08 DIAGNOSIS — Z21 Asymptomatic human immunodeficiency virus [HIV] infection status: Secondary | ICD-10-CM | POA: Diagnosis not present

## 2023-11-08 NOTE — ED Triage Notes (Signed)
 Patient reports lower back pain x 3 days, no previous hx reported. Patient taking BC powders q6h for pain. Denies lifting, injury, and trauma.

## 2023-11-08 NOTE — ED Provider Notes (Signed)
 [name] [facility] Provider Note   CSN: [id number] Arrival date & time: 11/08/23  1641     Patient presents with: No chief complaint on file.   Alan Boyle is a 43 y.o. male.  {Add pertinent medical, surgical, social history, OB history to YEP:67052} The history is provided by the patient and medical records.   43 year old male with history of depression, hypertension, diabetes, history of HIV, presenting to the ED with low back pain for the past 3 days.  He denies any injury, trauma, fall, or heavy lifting.  States pain seem to be present 1 morning upon waking.  Pain localized to the low back without radiation into the legs.  He has not had any bowel or bladder incontinence.  No fever or other infectious symptoms.  He denies any prior history of back injuries or surgeries in the past.  Does admit that his bed is a bit wonky.  And thinks possibly he may have slept wrong.  Prior to Admission medications   Medication Sig Start Date End Date Taking? Authorizing Provider  amLODipine  (NORVASC ) 10 MG tablet Take 1 tablet (10 mg total) by mouth daily. 08/23/23   Calone, Gregory D, FNP  bictegravir-emtricitabine-tenofovir  AF (BIKTARVY ) 50-200-25 MG TABS tablet Take 1 tablet by mouth daily. 08/23/23   Calone, Gregory D, FNP  canagliflozin  (INVOKANA ) 100 MG TABS tablet Take 1 tablet (100 mg total) by mouth daily before breakfast. 08/23/23   Calone, Gregory D, FNP  Dulaglutide  (TRULICITY ) 1.5 MG/0.5ML SOAJ Inject 1.5 mg into the skin once a week. 08/23/23   Calone, Gregory D, FNP  lisinopril  (ZESTRIL ) 40 MG tablet Take 1 tablet (40 mg total) by mouth daily. 08/23/23   Calone, Gregory D, FNP  metFORMIN  (GLUCOPHAGE -XR) 500 MG 24 hr tablet Take 1 tablet (500 mg total) by mouth daily with breakfast. 08/23/23   Calone, Gregory D, FNP  rosuvastatin  (CRESTOR ) 10 MG tablet Take 1 tablet (10 mg total) by mouth daily. 08/23/23   Calone, Gregory D, FNP    Allergies:  Patient has no known allergies.    Review of Systems  Musculoskeletal:  Positive for back pain.  All other systems reviewed and are negative.   Updated Vital Signs BP 117/80   Pulse 80   Temp 97.6 F (36.4 C)   Resp 18   Ht 6' 2 (1.88 m)   Wt 127 kg   SpO2 97%   BMI 35.95 kg/m   Physical Exam Vitals and nursing note reviewed.  Constitutional:      Appearance: He is well-developed.  HENT:     Head: Normocephalic and atraumatic.  Eyes:     Conjunctiva/sclera: Conjunctivae normal.     Pupils: Pupils are equal, round, and reactive to light.  Cardiovascular:     Rate and Rhythm: Normal rate and regular rhythm.     Heart sounds: Normal heart sounds.  Pulmonary:     Effort: Pulmonary effort is normal.     Breath sounds: Normal breath sounds.  Musculoskeletal:        General: Normal range of motion.     Cervical back: Normal range of motion.     Comments: Lumbar spine without acute deformities, mildly tender along left lumbar paraspinal musculature, able to sit/stand and change position without issue, normal leg strength, normal gait  Skin:    General: Skin is warm and dry.  Neurological:     Mental Status: He is alert and oriented to person, place, and  time.     (all labs ordered are listed, but only abnormal results are displayed) Labs Reviewed - No data to display  EKG: None  Radiology: No results found.  {Document cardiac monitor, telemetry assessment procedure when appropriate:32947} Procedures   Medications Ordered in the ED - No data to display    {Click here for ABCD2, HEART and other calculators REFRESH Note before signing:1}                              Medical Decision Making Amount and/or Complexity of Data Reviewed Radiology: ordered.   ***  {Document critical care time when appropriate  Document review of labs and clinical decision tools ie CHADS2VASC2, etc  Document your independent review of radiology images and any outside records   Document your discussion with family members, caretakers and with consultants  Document social determinants of health affecting pt's care  Document your decision making why or why not admission, treatments were needed:32947:::1}   Final diagnoses:  None    ED Discharge Orders     None

## 2023-11-09 ENCOUNTER — Emergency Department (HOSPITAL_COMMUNITY)

## 2023-11-09 MED ORDER — METHOCARBAMOL 500 MG PO TABS: 500.0000 mg | ORAL_TABLET | Freq: Two times a day (BID) | ORAL | 0 refills | Status: DC

## 2023-11-09 NOTE — Discharge Instructions (Signed)
 Take the prescribed medication as directed.  Can use tylenol /motrin with this if you desire.  Heat may also help (heating pad, hot shower, warm bath, etc). Follow-up with your doctor if ongoing issues. Return to the ED for new or worsening symptoms.

## 2023-11-19 ENCOUNTER — Ambulatory Visit: Admitting: Family

## 2023-11-19 ENCOUNTER — Telehealth: Payer: Self-pay

## 2023-11-19 ENCOUNTER — Other Ambulatory Visit (HOSPITAL_COMMUNITY): Payer: Self-pay

## 2023-11-19 NOTE — Telephone Encounter (Signed)
 Pharmacy Patient Advocate Encounter   Received notification from [employer] that prior authorization for TRULICITY  is required/requested.   Insurance verification completed.   The patient is insured through [facility] MEDICAID .   Per test claim: PA required; PA submitted to above mentioned insurance via [employer] Key/confirmation #/EOC [id number] Status is pending

## 2023-11-19 NOTE — Telephone Encounter (Signed)
 Patient called to follow up on Trulicity , reports he has not been able to get it from his pharmacy.   Tenicia Gural, BSN, RN

## 2023-11-20 NOTE — Telephone Encounter (Signed)
 Pharmacy Patient Advocate Encounter  Received notification from [facility] MEDICAID that Prior Authorization for TRULICITY  has been DENIED.  Full denial letter will be uploaded to the media tab. See denial reason below.   PA #/Case ID/Reference #: [id number]

## 2023-12-06 ENCOUNTER — Telehealth: Payer: Self-pay

## 2023-12-06 ENCOUNTER — Other Ambulatory Visit (HOSPITAL_COMMUNITY): Payer: Self-pay

## 2023-12-06 NOTE — Telephone Encounter (Addendum)
 Pharmacy Patient Advocate Encounter  Received notification from [facility] MEDICAID that Prior Authorization for INVOKANA  has been APPROVED from 12/05/23 to 12/04/24   PA #/Case ID/Reference #: [id number]

## 2023-12-06 NOTE — Telephone Encounter (Addendum)
 Pharmacy Patient Advocate Encounter   Received notification from [employer] that prior authorization for INVOKANA  is required/requested.   Insurance verification completed.   The patient is insured through [employer] .   Per test claim: PA required; PA submitted to above mentioned insurance via [employer] Key/confirmation #/EOC [id number] Status is pending

## 2023-12-11 ENCOUNTER — Encounter: Payer: Self-pay | Admitting: Family

## 2023-12-11 ENCOUNTER — Other Ambulatory Visit: Payer: Self-pay

## 2023-12-11 ENCOUNTER — Ambulatory Visit: Admitting: Family

## 2023-12-11 VITALS — BP 122/82 | HR 87 | Temp 98.7°F | Resp 16 | Wt 274.0 lb

## 2023-12-11 DIAGNOSIS — E1165 Type 2 diabetes mellitus with hyperglycemia: Secondary | ICD-10-CM | POA: Diagnosis present

## 2023-12-11 DIAGNOSIS — B20 Human immunodeficiency virus [HIV] disease: Secondary | ICD-10-CM

## 2023-12-11 DIAGNOSIS — Z0000 Encounter for general adult medical examination without abnormal findings: Secondary | ICD-10-CM

## 2023-12-11 DIAGNOSIS — I10 Essential (primary) hypertension: Secondary | ICD-10-CM

## 2023-12-11 DIAGNOSIS — Z7984 Long term (current) use of oral hypoglycemic drugs: Secondary | ICD-10-CM | POA: Diagnosis not present

## 2023-12-11 DIAGNOSIS — A539 Syphilis, unspecified: Secondary | ICD-10-CM

## 2023-12-11 NOTE — Patient Instructions (Signed)
 Nice to see you.  We will check your lab work today.  Continue to take your medication daily as prescribed.  Refills have been sent to the pharmacy.  Plan for follow up in 3 months or sooner if needed with lab work on the same day.  Have a great day and stay safe!

## 2023-12-11 NOTE — Assessment & Plan Note (Signed)
 Blood pressure well-controlled with current dose of amlodipine  and lisinopril .  No adverse side effects and denies cough or excessive leg swelling.  No neurologic/ophthalmologic signs/symptoms.  Encouraged monitor blood pressure at home as able.  Continue current dose of amlodipine  and lisinopril .

## 2023-12-11 NOTE — Assessment & Plan Note (Signed)
 Alan Boyle continues to have less than optimally controlled type 2 diabetes multifactorial including medication and nutrition.  Provided basic nutrition information including carbohydrates contained in food and fast food options that could potentially lower blood sugars.  Would benefit from diabetes education will place referral to diabetes nutrition education for further follow-up.  Continue current dose of Invokana .  If hemoglobin A1c remains elevated will increase metformin  to 1 g daily.  Does not currently check blood sugars at home.  Encouraged to complete diabetic eye exam.  Plan for follow-up in 3 months or sooner if needed.

## 2023-12-11 NOTE — Assessment & Plan Note (Signed)
 Discussed importance of safe sexual practice and condom use. Condoms and site specific STD testing offered.  Vaccinations reviewed and following counseling declined. Due for colonoscopy and will consider Due for routine dental care.

## 2023-12-11 NOTE — Assessment & Plan Note (Signed)
 Alan Boyle's RPR titer is stable at 1: 4 and would not treat unless titer reaches 1: 16 or higher.  Discussed nature of RPR testing and that he does not currently have syphilis and there is no need for treatment at this time.  Check RPR with lab work.

## 2023-12-11 NOTE — Progress Notes (Signed)
 Brief Narrative   Patient ID: Alan Boyle, male    DOB: 24-Feb-1981, 43 y.o.   MRN: [id number]  Mr. Tuminello is a 43 year old African-American male with HIV disease with risk factor of MSM.  Initial viral load and CD4 count are unavailable.  Most recent genotype from 3/21 with no significant medication resistance mutations. [id number] negative. No history of opportunistic infection. Previous ART history of a Atripla.   Subjective:   Chief Complaint  Patient presents with   Follow-up    B20     HPI:  Alan Boyle is a 43 y.o. male with HIV disease last seen on 08/23/2023 with well-controlled virus and good adherence and tolerance to Biktarvy .  Viral load was undetectable with CD4 count 741.  RPR titer was 1: 2.  Kidney function, liver function, electrolytes within normal ranges.  Last hemoglobin A1c of 8.4%.  Seen in the interim for STD screening with all STD testing being negative.  Here today for routine follow-up.  Alan Boyle has been doing okay since his last office visit and continues to take medications as prescribed with no adverse side effects or problems obtaining medication from the pharmacy.  No longer taking Trulicity  as it was not approved by insurance.  Continues to take remaining medications.  Has questions about carbohydrates and general nutrition information.  Current nutritional intake is primarily fast food and sodas.  Questions about syphilis and RPR testing.  Covered by Medicaid.  Healthcare maintenance reviewed.  Condoms and site-specific STD testing offered.  Housing, transportation, and access to food are stable.  Denies numbness/tingling, excessive hunger, excessive thirst, or excessive urination.  Denies fevers, chills, night sweats, headaches, changes in vision, neck pain/stiffness, nausea, diarrhea, vomiting, lesions or rashes.  Lab Results  Component Value Date   CD4TCELL 24 (L) 08/23/2023   CD4TABS 506 12/20/2022   Lab Results  Component Value Date    HIV1RNAQUANT <20 DETECTED (A) 08/23/2023     No Known Allergies    Outpatient Medications Prior to Visit  Medication Sig Dispense Refill   amLODipine  (NORVASC ) 10 MG tablet Take 1 tablet (10 mg total) by mouth daily. 90 tablet 1   bictegravir-emtricitabine-tenofovir  AF (BIKTARVY ) 50-200-25 MG TABS tablet Take 1 tablet by mouth daily. 30 tablet 5   canagliflozin  (INVOKANA ) 100 MG TABS tablet Take 1 tablet (100 mg total) by mouth daily before breakfast. 30 tablet 3   lisinopril  (ZESTRIL ) 40 MG tablet Take 1 tablet (40 mg total) by mouth daily. 90 tablet 1   metFORMIN  (GLUCOPHAGE -XR) 500 MG 24 hr tablet Take 1 tablet (500 mg total) by mouth daily with breakfast. 30 tablet 3   methocarbamol  (ROBAXIN ) 500 MG tablet Take 1 tablet (500 mg total) by mouth 2 (two) times daily. 20 tablet 0   rosuvastatin  (CRESTOR ) 10 MG tablet Take 1 tablet (10 mg total) by mouth daily. 90 tablet 1   Dulaglutide  (TRULICITY ) 1.5 MG/0.5ML SOAJ Inject 1.5 mg into the skin once a week. 2 mL 3   No facility-administered medications prior to visit.     Past Medical History:  Diagnosis Date   Diabetes mellitus    NO MEDS (FINANCIAL ISSUE)   HIV disease (HCC)    Hypertension    NO MEDS (FINANCIAL ISSUE)   Rectal mass    Rectal pain    Syphilis      Past Surgical History:  Procedure Laterality Date   TUMOR EXCISION N/A 12/16/2012   Procedure: EXAM UNDER ANESTHESIA AND REMOVAL OF RECTAL  MASS;  Surgeon: Morene ONEIDA Olives, MD;  Location: [facility];  Service: General;  Laterality: N/A;        Review of Systems  Constitutional:  Negative for appetite change, chills, fatigue, fever and unexpected weight change.  Eyes:  Negative for visual disturbance.  Respiratory:  Negative for cough, chest tightness, shortness of breath and wheezing.   Cardiovascular:  Negative for chest pain and leg swelling.  Gastrointestinal:  Negative for abdominal pain, constipation, diarrhea, nausea and vomiting.   Genitourinary:  Negative for dysuria, flank pain, frequency, genital sores, hematuria and urgency.  Skin:  Negative for rash.  Allergic/Immunologic: Negative for immunocompromised state.  Neurological:  Negative for dizziness and headaches.     Objective:   BP 122/82   Pulse 87   Temp 98.7 F (37.1 C) (Oral)   Resp 16   Wt 274 lb (124.3 kg)   SpO2 97%   BMI 35.18 kg/m  Nursing note and vital signs reviewed.  Physical Exam Constitutional:      General: He is not in acute distress.    Appearance: He is well-developed.  Eyes:     Conjunctiva/sclera: Conjunctivae normal.  Cardiovascular:     Rate and Rhythm: Normal rate and regular rhythm.     Heart sounds: Normal heart sounds. No murmur heard.    No friction rub. No gallop.  Pulmonary:     Effort: Pulmonary effort is normal. No respiratory distress.     Breath sounds: Normal breath sounds. No wheezing or rales.  Chest:     Chest wall: No tenderness.  Abdominal:     General: Bowel sounds are normal.     Palpations: Abdomen is soft.     Tenderness: There is no abdominal tenderness.  Musculoskeletal:     Cervical back: Neck supple.  Lymphadenopathy:     Cervical: No cervical adenopathy.  Skin:    General: Skin is warm and dry.     Findings: No rash.  Neurological:     Mental Status: He is alert and oriented to person, place, and time.  Psychiatric:        Behavior: Behavior normal.        Thought Content: Thought content normal.        Judgment: Judgment normal.          12/11/2023    1:03 PM 08/23/2023    3:37 PM 05/01/2023   10:59 AM 12/20/2022    9:49 AM 10/09/2022   10:20 AM  Depression screen PHQ 2/9  Decreased Interest 0 0 0 0 0  Down, Depressed, Hopeless 0 0 0 0 0  PHQ - 2 Score 0 0 0 0 0  Altered sleeping  0     Tired, decreased energy  0     Change in appetite  0     Feeling bad or failure about yourself   0     Trouble concentrating  0     Moving slowly or fidgety/restless  0     Suicidal  thoughts  0     PHQ-9 Score  0           08/23/2023    3:36 PM  GAD 7 : Generalized Anxiety Score  Nervous, Anxious, on Edge 0  Control/stop worrying 0  Worry too much - different things 0  Trouble relaxing 0  Restless 0  Easily annoyed or irritable 0  Afraid - awful might happen 0  Total GAD 7 Score 0  The 10-year ASCVD risk score (Arnett DK, et al., 2019) is: 19.8%   Values used to calculate the score:     Age: 4 years     Clincally relevant sex: Male     Is Non-Hispanic African American: Yes     Diabetic: Yes     Tobacco smoker: Yes     Systolic Blood Pressure: 122 mmHg     Is BP treated: Yes     HDL Cholesterol: 36 mg/dL     Total Cholesterol: 160 mg/dL      Assessment & Plan:    Patient Active Problem List   Diagnosis Date Noted   Dental infection 12/20/2022   At increased risk for cardiovascular disease 10/09/2022   Healthcare maintenance 07/11/2019   Oral candidiasis 06/10/2019   Rectal lesion 06/10/2019   Tobacco use disorder 09/26/2013   HIV disease (HCC)    Obese 08/15/2011   SYPHILIS 03/29/2010   AIDS (acquired immune deficiency syndrome) (HCC) 05/06/2009   Type 2 diabetes mellitus with hyperglycemia (HCC) 05/06/2009   DEPRESSION 05/06/2009   Essential hypertension 05/06/2009     Problem List Items Addressed This Visit       Cardiovascular and Mediastinum   Essential hypertension   Blood pressure well-controlled with current dose of amlodipine  and lisinopril .  No adverse side effects and denies cough or excessive leg swelling.  No neurologic/ophthalmologic signs/symptoms.  Encouraged monitor blood pressure at home as able.  Continue current dose of amlodipine  and lisinopril .        Endocrine   Type 2 diabetes mellitus with hyperglycemia (HCC) - Primary   Alan Boyle continues to have less than optimally controlled type 2 diabetes multifactorial including medication and nutrition.  Provided basic nutrition information including carbohydrates  contained in food and fast food options that could potentially lower blood sugars.  Would benefit from diabetes education will place referral to diabetes nutrition education for further follow-up.  Continue current dose of Invokana .  If hemoglobin A1c remains elevated will increase metformin  to 1 g daily.  Does not currently check blood sugars at home.  Encouraged to complete diabetic eye exam.  Plan for follow-up in 3 months or sooner if needed.      Relevant Orders   HgB A1c   Ambulatory referral to Nutrition and Diabetic Education     Other   SYPHILIS   Alan Boyle RPR titer is stable at 1: 4 and would not treat unless titer reaches 1: 16 or higher.  Discussed nature of RPR testing and that he does not currently have syphilis and there is no need for treatment at this time.  Check RPR with lab work.      HIV disease [facility])   Alan Boyle continues to have well-controlled virus with good adherence and tolerance to Biktarvy .  Reviewed previous lab work and discussed plan of care and U equals U.  No problems obtaining medication from the pharmacy and covered by Medicaid.  Social determinants of health reviewed with no interventions indicated.  Check blood work.  Continue current dose of Biktarvy .  Plan for follow-up with diabetes in 3 months or sooner if needed.      Relevant Orders   Comprehensive metabolic panel with GFR   HIV-1 RNA quant-no reflex-bld   T-helper cell (CD4)- ([facility] clinic only)   Healthcare maintenance   Discussed importance of safe sexual practice and condom use. Condoms and site specific STD testing offered.  Vaccinations reviewed and following counseling declined. Due for colonoscopy and  will consider Due for routine dental care.         I have discontinued Curtistine PARAS. Fister's Trulicity . I am also having him maintain his amLODipine , Biktarvy , Invokana , lisinopril , metFORMIN , rosuvastatin , and methocarbamol .    Follow-up: Return in about 3 months (around  03/11/2024). or sooner if needed.    Alan July, MSN, FNP-C Nurse Practitioner [facility] for Infectious Disease [facility] Main number: [phone]

## 2023-12-11 NOTE — Assessment & Plan Note (Signed)
 Mr. Gelpi continues to have well-controlled virus with good adherence and tolerance to Biktarvy .  Reviewed previous lab work and discussed plan of care and U equals U.  No problems obtaining medication from the pharmacy and covered by Medicaid.  Social determinants of health reviewed with no interventions indicated.  Check blood work.  Continue current dose of Biktarvy .  Plan for follow-up with diabetes in 3 months or sooner if needed.

## 2023-12-12 LAB — T-HELPER CELL (CD4) - (RCID CLINIC ONLY)
CD4 % Helper T Cell: 25 % — ABNORMAL LOW (ref 33–65)
CD4 T Cell Abs: 487 /uL (ref 400–1790)

## 2023-12-14 LAB — HEMOGLOBIN A1C
Hgb A1c MFr Bld: 8.2 % — ABNORMAL HIGH (ref <5.7)
Mean Plasma Glucose: 189 mg/dL
eAG (mmol/L): 10.4 mmol/L

## 2023-12-14 LAB — COMPREHENSIVE METABOLIC PANEL WITH GFR
AG Ratio: 1.4 (calc) (ref 1.0–2.5)
ALT: 14 U/L (ref 9–46)
AST: 11 U/L (ref 10–40)
Albumin: 4.2 g/dL (ref 3.6–5.1)
Alkaline phosphatase (APISO): 64 U/L (ref 36–130)
BUN: 12 mg/dL (ref 7–25)
CO2: 28 mmol/L (ref 20–32)
Calcium: 9.4 mg/dL (ref 8.6–10.3)
Chloride: 104 mmol/L (ref 98–110)
Creat: 1.05 mg/dL (ref 0.60–1.29)
Globulin: 3.1 g/dL (ref 1.9–3.7)
Glucose, Bld: 197 mg/dL — ABNORMAL HIGH (ref 65–99)
Potassium: 4.1 mmol/L (ref 3.5–5.3)
Sodium: 140 mmol/L (ref 135–146)
Total Bilirubin: 0.4 mg/dL (ref 0.2–1.2)
Total Protein: 7.3 g/dL (ref 6.1–8.1)
eGFR: 89 mL/min/1.73m2 (ref >=60)

## 2023-12-14 LAB — HIV-1 RNA QUANT-NO REFLEX-BLD
HIV 1 RNA Quant: <20 {copies}/mL — AB
HIV-1 RNA Quant, Log: <1.3 {Log_copies}/mL — AB

## 2023-12-17 ENCOUNTER — Ambulatory Visit: Payer: Self-pay | Admitting: Family

## 2024-02-20 ENCOUNTER — Other Ambulatory Visit: Payer: Self-pay | Admitting: Family

## 2024-03-03 ENCOUNTER — Other Ambulatory Visit: Payer: Self-pay | Admitting: Family

## 2024-03-03 DIAGNOSIS — I10 Essential (primary) hypertension: Secondary | ICD-10-CM

## 2024-03-03 MED ORDER — LISINOPRIL 40 MG PO TABS: 40.0000 mg | ORAL_TABLET | Freq: Every day | ORAL | 1 refills | Status: AC

## 2024-03-11 ENCOUNTER — Other Ambulatory Visit: Payer: Self-pay

## 2024-03-11 ENCOUNTER — Ambulatory Visit (INDEPENDENT_AMBULATORY_CARE_PROVIDER_SITE_OTHER): Admitting: Family

## 2024-03-11 ENCOUNTER — Encounter: Payer: Self-pay | Admitting: Family

## 2024-03-11 VITALS — BP 136/89 | HR 84 | Temp 97.6°F | Ht 74.0 in | Wt 273.0 lb

## 2024-03-11 DIAGNOSIS — B20 Human immunodeficiency virus [HIV] disease: Secondary | ICD-10-CM

## 2024-03-11 DIAGNOSIS — Z9189 Other specified personal risk factors, not elsewhere classified: Secondary | ICD-10-CM | POA: Diagnosis not present

## 2024-03-11 DIAGNOSIS — I10 Essential (primary) hypertension: Secondary | ICD-10-CM | POA: Diagnosis not present

## 2024-03-11 DIAGNOSIS — Z0000 Encounter for general adult medical examination without abnormal findings: Secondary | ICD-10-CM

## 2024-03-11 DIAGNOSIS — A539 Syphilis, unspecified: Secondary | ICD-10-CM

## 2024-03-11 DIAGNOSIS — E1165 Type 2 diabetes mellitus with hyperglycemia: Secondary | ICD-10-CM | POA: Diagnosis not present

## 2024-03-11 MED ORDER — ROSUVASTATIN CALCIUM 10 MG PO TABS: 10.0000 mg | ORAL_TABLET | Freq: Every day | ORAL | 1 refills | Status: AC

## 2024-03-11 MED ORDER — INVOKANA 100 MG PO TABS: 100.0000 mg | ORAL_TABLET | Freq: Every day | ORAL | 3 refills | Status: AC

## 2024-03-11 NOTE — Assessment & Plan Note (Signed)
 Stable with current dose of rosuvastatin  and no adverse side effects or myalgias. Continue current dose of rosuvastatin .

## 2024-03-11 NOTE — Assessment & Plan Note (Signed)
 Syphilis titer appears serofast at 1:4 at present with no evidence of new infection or indication for treatment. Check RPR.

## 2024-03-11 NOTE — Assessment & Plan Note (Signed)
 Blood pressure adequately controlled with current dose of amlodipine  and lisinopril  and no adverse side effects. Encouraged to monitor blood pressure at home and follow DASH/low sodium intake. No neurological/ophthalmologic signs/symptoms. Continue current dose of lisinopril  and amlodipine .

## 2024-03-11 NOTE — Progress Notes (Signed)
 "   Brief Narrative   Patient ID: Alan Boyle, male    DOB: [birth year], 43 y.o.   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  Initial viral load and CD4 count are unavailable.  Most recent genotype from 3/21 with no significant medication resistance mutations. [id number] negative. No history of opportunistic infection. Previous ART history of a Atripla.   Subjective:   Chief Complaint  Patient presents with   Follow-up    B20, Hypertension, Diabetes     HPI:  Alan Boyle is a 43 y.o. male with HIV disease last seen on 12/11/2023 with well-controlled virus and good adherence and tolerance to Biktarvy .  Viral load was undetectable with CD4 count 487.  Kidney function, liver function, electrolytes within normal ranges.  Hemoglobin A1c of 8.2%.  Blood pressure was adequately controlled with lisinopril  and amlodipine .  Here today for routine follow-up.  Alan Boyle been doing well since his last office visit and continues to take medications as prescribed with no adverse side effects or problems obtaining medication from the pharmacy.  Covered by Medicaid.  Has not visited with medical nutrition therapy yet.  No new concerns/complaints.  Housing, transportation, and access to food are stable.  Healthcare maintenance reviewed.  Denies fevers, chills, night sweats, headaches, changes in vision, neck pain/stiffness, nausea, diarrhea, vomiting, lesions or rashes.  Lab Results  Component Value Date   CD4TCELL 25 (L) 12/11/2023   CD4TABS 487 12/11/2023   Lab Results  Component Value Date   HIV1RNAQUANT <20 DETECTED (A) 12/11/2023     Allergies[1]    Outpatient Medications Prior to Visit  Medication Sig Dispense Refill   amLODipine  (NORVASC ) 10 MG tablet TAKE 1 TABLET(10 MG) BY MOUTH DAILY 90 tablet 1   bictegravir-emtricitabine-tenofovir  AF (BIKTARVY ) 50-200-25 MG TABS tablet Take 1 tablet by mouth daily. 30 tablet  5   lisinopril  (ZESTRIL ) 40 MG tablet Take 1 tablet (40 mg total) by mouth daily. 90 tablet 1   metFORMIN  (GLUCOPHAGE -XR) 500 MG 24 hr tablet TAKE 1 TABLET(500 MG) BY MOUTH DAILY WITH BREAKFAST 30 tablet 3   canagliflozin  (INVOKANA ) 100 MG TABS tablet Take 1 tablet (100 mg total) by mouth daily before breakfast. 30 tablet 3   methocarbamol  (ROBAXIN ) 500 MG tablet Take 1 tablet (500 mg total) by mouth 2 (two) times daily. 20 tablet 0   rosuvastatin  (CRESTOR ) 10 MG tablet Take 1 tablet (10 mg total) by mouth daily. 90 tablet 1   No facility-administered medications prior to visit.     Past Medical History:  Diagnosis Date   Diabetes mellitus    NO MEDS (FINANCIAL ISSUE)   HIV disease (HCC)    Hypertension    NO MEDS (FINANCIAL ISSUE)   Rectal mass    Rectal pain    Syphilis      Past Surgical History:  Procedure Laterality Date   TUMOR EXCISION N/A 12/16/2012   Procedure: EXAM UNDER ANESTHESIA AND REMOVAL OF RECTAL MASS;  Surgeon: Morene ONEIDA Olives, MD;  Location: [facility];  Service: General;  Laterality: N/A;        Review of Systems  Constitutional:  Negative for appetite change, chills, fatigue, fever and unexpected weight change.  Eyes:  Negative for visual disturbance.  Respiratory:  Negative for cough, chest tightness, shortness of breath and wheezing.   Cardiovascular:  Negative for chest pain and leg swelling.  Gastrointestinal:  Negative for abdominal pain, constipation,  diarrhea, nausea and vomiting.  Genitourinary:  Negative for dysuria, flank pain, frequency, genital sores, hematuria and urgency.  Skin:  Negative for rash.  Allergic/Immunologic: Negative for immunocompromised state.  Neurological:  Negative for dizziness and headaches.     Objective:   BP 136/89   Pulse 84   Temp 97.6 F (36.4 C) (Temporal)   Ht 6' 2 (1.88 m)   Wt 273 lb (123.8 kg)   SpO2 100%   BMI 35.05 kg/m  Nursing note and vital signs reviewed.  Physical  Exam Constitutional:      General: He is not in acute distress.    Appearance: He is well-developed.  Eyes:     Conjunctiva/sclera: Conjunctivae normal.  Cardiovascular:     Rate and Rhythm: Normal rate and regular rhythm.     Heart sounds: Normal heart sounds. No murmur heard.    No friction rub. No gallop.  Pulmonary:     Effort: Pulmonary effort is normal. No respiratory distress.     Breath sounds: Normal breath sounds. No wheezing or rales.  Chest:     Chest wall: No tenderness.  Abdominal:     General: Bowel sounds are normal.     Palpations: Abdomen is soft.     Tenderness: There is no abdominal tenderness.  Musculoskeletal:     Cervical back: Neck supple.  Lymphadenopathy:     Cervical: No cervical adenopathy.  Skin:    General: Skin is warm and dry.     Findings: No rash.  Neurological:     Mental Status: He is alert and oriented to person, place, and time.          12/11/2023    1:03 PM 08/23/2023    3:37 PM 05/01/2023   10:59 AM 12/20/2022    9:49 AM 10/09/2022   10:20 AM  Depression screen PHQ 2/9  Decreased Interest 0 0 0 0 0  Down, Depressed, Hopeless 0 0 0 0 0  PHQ - 2 Score 0 0 0 0 0  Altered sleeping  0     Tired, decreased energy  0     Change in appetite  0     Feeling bad or failure about yourself   0     Trouble concentrating  0     Moving slowly or fidgety/restless  0     Suicidal thoughts  0     PHQ-9 Score  0         Data saved with a previous flowsheet row definition        08/23/2023    3:36 PM  GAD 7 : Generalized Anxiety Score  Nervous, Anxious, on Edge 0  Control/stop worrying 0  Worry too much - different things 0  Trouble relaxing 0  Restless 0  Easily annoyed or irritable 0  Afraid - awful might happen 0  Total GAD 7 Score 0     The 10-year ASCVD risk score (Arnett DK, et al., 2019) is: 23.8%   Values used to calculate the score:     Age: 54 years     Clinically relevant sex: Male     Is Non-Hispanic African American:  Yes     Diabetic: Yes     Tobacco smoker: Yes     Systolic Blood Pressure: 136 mmHg     Is BP treated: Yes     HDL Cholesterol: 36 mg/dL     Total Cholesterol: 160 mg/dL      Assessment & Plan:  Patient Active Problem List   Diagnosis Date Noted   Dental infection 12/20/2022   At increased risk for cardiovascular disease 10/09/2022   Healthcare maintenance 07/11/2019   Oral candidiasis 06/10/2019   Rectal lesion 06/10/2019   Tobacco use disorder 09/26/2013   HIV disease (HCC)    Obese 08/15/2011   SYPHILIS 03/29/2010   AIDS (acquired immune deficiency syndrome) (HCC) 05/06/2009   Type 2 diabetes mellitus with hyperglycemia (HCC) 05/06/2009   DEPRESSION 05/06/2009   Essential hypertension 05/06/2009     Problem List Items Addressed This Visit       Cardiovascular and Mediastinum   Essential hypertension   Blood pressure adequately controlled with current dose of amlodipine  and lisinopril  and no adverse side effects. Encouraged to monitor blood pressure at home and follow DASH/low sodium intake. No neurological/ophthalmologic signs/symptoms. Continue current dose of lisinopril  and amlodipine .       Relevant Medications   rosuvastatin  (CRESTOR ) 10 MG tablet     Endocrine   Type 2 diabetes mellitus with hyperglycemia [facility])   Alan Boyle continues to take Invokana  and metformin  as prescribed with no adverse side effects.  Discussed importance of maintaining blood sugars to reduce risk of complications in the future.  Encouraged to continue with medical nutrition therapy referral.  Previous A1c slightly improved to 8.2% and will recheck today.  Remains due for diabetic eye exam.  Continue current dose of Invokana  and metformin  pending blood work results.  Plan for follow-up in 3 months or sooner if needed with lab work on the same day.      Relevant Medications   rosuvastatin  (CRESTOR ) 10 MG tablet   canagliflozin  (INVOKANA ) 100 MG TABS tablet   Other Relevant Orders    HgB A1c     Other   SYPHILIS   Syphilis titer appears serofast at 1:4 at present with no evidence of new infection or indication for treatment. Check RPR.       Relevant Orders   RPR W/RFLX TO RPR TITER, TREPONEMAL AB, SCREEN AND DIAGNOSIS   HIV disease (HCC) - Primary   Alan Boyle continues to have well-controlled virus with good adherence and tolerance to Biktarvy .  Reviewed lab work and discussed plan of care and U equals U.  No problems obtaining medication from the pharmacy and covered by Medicaid.  Social determinants of health reviewed with no interventions indicated.  Check blood work.  Continue current dose of Biktarvy .  Plan for follow-up in 3 months or sooner if needed with lab work on the same day.      Relevant Orders   Comprehensive metabolic panel with GFR   HIV-1 RNA quant-no reflex-bld   T-helper cell (CD4)- ([facility] clinic only)   Healthcare maintenance   Discussed importance of safe sexual practice and condom use. Condoms and site specific STD testing offered.  Vaccinations reviewed and following counseling declined. Due for colon cancer screening through colonoscopy.  Declined referral He will schedule routine dental care independently.      At increased risk for cardiovascular disease   Stable with current dose of rosuvastatin  and no adverse side effects or myalgias. Continue current dose of rosuvastatin .         I have discontinued Alan Boyle's methocarbamol . I am also having him maintain his Biktarvy , metFORMIN , amLODipine , lisinopril , rosuvastatin , and Invokana .   Meds ordered this encounter  Medications   rosuvastatin  (CRESTOR ) 10 MG tablet    Sig: Take 1 tablet (10 mg total) by mouth daily.  Dispense:  90 tablet    Refill:  1    Supervising Provider:   SNIDER, CYNTHIA [[id number]]   canagliflozin  (INVOKANA ) 100 MG TABS tablet    Sig: Take 1 tablet (100 mg total) by mouth daily before breakfast.    Dispense:  30 tablet    Refill:  3    Supervising  Provider:   LUIZ CHANNEL [[id number]]     Follow-up: Return in about 3 months (around 06/09/2024). or sooner if needed.    Cathlyn July, MSN, FNP-C Nurse Practitioner [facility] Main number: [phone]      [1] No Known Allergies  "

## 2024-03-11 NOTE — Patient Instructions (Addendum)
 Nice to see you.  We will check your lab work today.  Continue to take your medication daily as prescribed.  Refills have been sent to the pharmacy.  Plan for follow up in 3 months or sooner if needed with lab work on the same day.  Have a great day and stay safe!

## 2024-03-11 NOTE — Assessment & Plan Note (Signed)
 Alan Boyle continues to have well-controlled virus with good adherence and tolerance to Biktarvy .  Reviewed lab work and discussed plan of care and U equals U.  No problems obtaining medication from the pharmacy and covered by Medicaid.  Social determinants of health reviewed with no interventions indicated.  Check blood work.  Continue current dose of Biktarvy .  Plan for follow-up in 3 months or sooner if needed with lab work on the same day.

## 2024-03-11 NOTE — Assessment & Plan Note (Signed)
 Discussed importance of safe sexual practice and condom use. Condoms and site specific STD testing offered.  Vaccinations reviewed and following counseling declined. Due for colon cancer screening through colonoscopy.  Declined referral He will schedule routine dental care independently.

## 2024-03-11 NOTE — Assessment & Plan Note (Signed)
 Alan Boyle continues to take Invokana  and metformin  as prescribed with no adverse side effects.  Discussed importance of maintaining blood sugars to reduce risk of complications in the future.  Encouraged to continue with medical nutrition therapy referral.  Previous A1c slightly improved to 8.2% and will recheck today.  Remains due for diabetic eye exam.  Continue current dose of Invokana  and metformin  pending blood work results.  Plan for follow-up in 3 months or sooner if needed with lab work on the same day.

## 2024-03-12 LAB — T-HELPER CELL (CD4) - (RCID CLINIC ONLY)
CD4 % Helper T Cell: 25 % — ABNORMAL LOW (ref 33–65)
CD4 T Cell Abs: 536 /uL (ref 400–1790)

## 2024-03-15 LAB — COMPREHENSIVE METABOLIC PANEL WITH GFR
AG Ratio: 1.4 (calc) (ref 1.0–2.5)
ALT: 11 U/L (ref 9–46)
AST: 11 U/L (ref 10–40)
Albumin: 4.4 g/dL (ref 3.6–5.1)
Alkaline phosphatase (APISO): 82 U/L (ref 36–130)
BUN: 11 mg/dL (ref 7–25)
CO2: 28 mmol/L (ref 20–32)
Calcium: 9 mg/dL (ref 8.6–10.3)
Chloride: 101 mmol/L (ref 98–110)
Creat: 0.92 mg/dL (ref 0.60–1.29)
Globulin: 3.1 g/dL (ref 1.9–3.7)
Glucose, Bld: 160 mg/dL — ABNORMAL HIGH (ref 65–99)
Potassium: 3.6 mmol/L (ref 3.5–5.3)
Sodium: 137 mmol/L (ref 135–146)
Total Bilirubin: 0.6 mg/dL (ref 0.2–1.2)
Total Protein: 7.5 g/dL (ref 6.1–8.1)
eGFR: 105 mL/min/1.73m2

## 2024-03-15 LAB — RPR TITER: RPR Titer: 1:2 {titer} — ABNORMAL HIGH

## 2024-03-15 LAB — HIV-1 RNA QUANT-NO REFLEX-BLD
HIV 1 RNA Quant: NOT DETECTED {copies}/mL
HIV-1 RNA Quant, Log: NOT DETECTED {Log_copies}/mL

## 2024-03-15 LAB — HEMOGLOBIN A1C
Hgb A1c MFr Bld: 9.1 % — ABNORMAL HIGH
Mean Plasma Glucose: 214 mg/dL
eAG (mmol/L): 11.9 mmol/L

## 2024-03-15 LAB — T PALLIDUM AB: T Pallidum Abs: POSITIVE — AB

## 2024-03-15 LAB — SYPHILIS: RPR W/REFLEX TO RPR TITER AND TREPONEMAL ANTIBODIES, TRADITIONAL SCREENING AND DIAGNOSIS ALGORITHM: RPR Ser Ql: REACTIVE — AB

## 2024-03-17 ENCOUNTER — Other Ambulatory Visit: Payer: Self-pay

## 2024-03-17 ENCOUNTER — Emergency Department (HOSPITAL_COMMUNITY)
Admission: EM | Admit: 2024-03-17 | Discharge: 2024-03-17 | Disposition: A | Discharge status: Home / Self Care | Attending: Emergency Medicine | Admitting: Emergency Medicine

## 2024-03-17 ENCOUNTER — Ambulatory Visit: Payer: Self-pay | Admitting: Family

## 2024-03-17 ENCOUNTER — Encounter (HOSPITAL_COMMUNITY): Payer: Self-pay | Admitting: Emergency Medicine

## 2024-03-17 DIAGNOSIS — M79644 Pain in right finger(s): Secondary | ICD-10-CM | POA: Diagnosis present

## 2024-03-17 DIAGNOSIS — Z21 Asymptomatic human immunodeficiency virus [HIV] infection status: Secondary | ICD-10-CM | POA: Insufficient documentation

## 2024-03-17 DIAGNOSIS — L03011 Cellulitis of right finger: Secondary | ICD-10-CM | POA: Insufficient documentation

## 2024-03-17 MED ORDER — SULFAMETHOXAZOLE-TRIMETHOPRIM 800-160 MG PO TABS: 1.0000 | ORAL_TABLET | Freq: Two times a day (BID) | ORAL | 0 refills | Status: AC

## 2024-03-17 MED ORDER — LIDOCAINE HCL (PF) 1 % IJ SOLN
5.0000 mL | Freq: Once | INTRAMUSCULAR | Status: AC
  Administered 2024-03-17: 5 mL
  Filled 2024-03-17: qty 5

## 2024-03-17 MED ORDER — SULFAMETHOXAZOLE-TRIMETHOPRIM 800-160 MG PO TABS: 1.0000 | ORAL_TABLET | Freq: Two times a day (BID) | ORAL | 0 refills | Status: DC

## 2024-03-17 MED ORDER — METFORMIN HCL ER 500 MG PO TB24: 1000.0000 mg | ORAL_TABLET | Freq: Two times a day (BID) | ORAL | 3 refills | Status: AC

## 2024-03-17 NOTE — ED Triage Notes (Signed)
 C/o infection and pain right index finger onset 3 days

## 2024-03-17 NOTE — ED Triage Notes (Signed)
 Pt arrived to the ED via POV from home. Pt c/o a painful finger that appears to be infected. Pt believes he cut his finger a couple of weeks ago and that is where the infection started. Pt can't remember what he was doing when he cut his finger. Pt denies any allergies and says that he is HIV positive.

## 2024-03-17 NOTE — ED Provider Notes (Signed)
 " [place] EMERGENCY DEPARTMENT AT [facility] Provider Note   CSN: [id number] Arrival date & time: 03/17/24  1115     Patient presents with: Finger Injury   Alan Boyle is a 44 y.o. male.With a history of HIV and syphilis who presents to the ED for hand infection.  Last CD4 count on December 30 was 536.  Compliant with Biktarvy .  Patient states he may have had a small cut on his finger a week or 2 ago.  Increased pain swelling over the last 3 days concerning for infection.  No fevers chills or other complaints at this time.   HPI     Prior to Admission medications  Medication Sig Start Date End Date Taking? Authorizing Provider  amLODipine  (NORVASC ) 10 MG tablet TAKE 1 TABLET(10 MG) BY MOUTH DAILY 03/03/24   Calone, Gregory D, FNP  bictegravir-emtricitabine-tenofovir  AF (BIKTARVY ) 50-200-25 MG TABS tablet Take 1 tablet by mouth daily. 08/23/23   Calone, Gregory D, FNP  canagliflozin  (INVOKANA ) 100 MG TABS tablet Take 1 tablet (100 mg total) by mouth daily before breakfast. 03/11/24   Calone, Gregory D, FNP  lisinopril  (ZESTRIL ) 40 MG tablet Take 1 tablet (40 mg total) by mouth daily. 03/03/24   Calone, Gregory D, FNP  metFORMIN  (GLUCOPHAGE -XR) 500 MG 24 hr tablet TAKE 1 TABLET(500 MG) BY MOUTH DAILY WITH BREAKFAST 02/22/24   Calone, Gregory D, FNP  rosuvastatin  (CRESTOR ) 10 MG tablet Take 1 tablet (10 mg total) by mouth daily. 03/11/24   Calone, Gregory D, FNP  sulfamethoxazole -trimethoprim  (BACTRIM  DS) 800-160 MG tablet Take 1 tablet by mouth 2 (two) times daily for 7 days. 03/17/24 03/24/24  Pamella Ozell LABOR, DO    Allergies: Patient has no known allergies.    Review of Systems  Updated Vital Signs BP (!) 159/96   Pulse 84   Temp 98.2 F (36.8 C)   Resp 14   SpO2 100%   Physical Exam Vitals and nursing note reviewed.  HENT:     Head: Normocephalic and atraumatic.  Eyes:     Pupils: Pupils are equal, round, and reactive to light.  Cardiovascular:     Rate  and Rhythm: Normal rate and regular rhythm.  Pulmonary:     Effort: Pulmonary effort is normal.     Breath sounds: Normal breath sounds.  Skin:    General: Skin is warm and dry.     Comments: Yellow discoloration with surrounding erythema localized over distal palmar aspect on the ulnar side of right index finger Patient able to flex and extend the digit although with some pain Sensation tact light touch throughout  Neurological:     Mental Status: He is alert.  Psychiatric:        Mood and Affect: Mood normal.     (all labs ordered are listed, but only abnormal results are displayed) Labs Reviewed - No data to display  EKG: None  Radiology: No results found.   .Incision and Drainage: R index finger  Date/Time: 03/17/2024 12:45 PM  Performed by: Pamella Ozell LABOR, DO Authorized by: Pamella Ozell LABOR, DO   Consent:    Consent obtained:  Verbal   Consent given by:  Patient   Risks discussed:  Bleeding, incomplete drainage, infection and pain   Alternatives discussed:  No treatment Universal protocol:    Patient identity confirmed:  Verbally with patient Location:    Type:  Abscess   Size:  1cm   Location:  Upper extremity   Upper extremity  location:  Finger   Finger location:  R index finger Pre-procedure details:    Skin preparation:  Antiseptic wash Sedation:    Sedation type:  None Anesthesia:    Anesthesia method:  Local infiltration   Local anesthetic:  5 mL lidocaine  (PF) 1 % (Digital block) Procedure type:    Complexity:  Simple Procedure details:    Ultrasound guidance: no     Incision types:  Stab incision and single straight   Incision depth:  Subcutaneous   Wound management:  Probed and deloculated   Drainage:  Purulent and bloody   Drainage amount:  Scant   Wound treatment:  Wound left open   Packing materials:  None Post-procedure details:    Procedure completion:  Tolerated    Medications Ordered in the ED  lidocaine  (PF) (XYLOCAINE ) 1 %  injection 5 mL (has no administration in time range)    Clinical Course as of 03/17/24 1309  Mon Mar 17, 2024  1303 Incision and drainage completed.  Patient tolerated procedure well.  Dressed with bacitracin, bandage and finger splint for protection.  Will discharge on Bactrim  for 1 week and instruction for follow-up in the hand office for reevaluation as necessary [MP]    Clinical Course User Index [MP] Pamella Ozell LABOR, DO                                 Medical Decision Making 44 year old male with history as above presented to the ED given concern for right index finger infection.  Exam consistent with felon.  No signs of flexor tenosynovitis or systemic infection.  Will plan on drainage of felon and discharge with Keflex .  Will instruct for follow-up with hand in the next few days  Risk Prescription drug management.        Final diagnoses:  Felon of finger of right hand    ED Discharge Orders          Ordered    sulfamethoxazole -trimethoprim  (BACTRIM  DS) 800-160 MG tablet  2 times daily,   Status:  Discontinued        03/17/24 1307    sulfamethoxazole -trimethoprim  (BACTRIM  DS) 800-160 MG tablet  2 times daily        03/17/24 1309               Pamella Ozell LABOR, DO 03/17/24 1309  "

## 2024-03-17 NOTE — ED Notes (Signed)
 MD at bedside.

## 2024-03-17 NOTE — Discharge Instructions (Addendum)
 You were seen in the ER for a right index finger infection The infection is consistent with something called a felon which is a small abscess at the tip of your finger We performed an incision and drainage We discharged you on antibiotics Please pick up the prescribed antibiotics from your pharmacy and take as directed for the next 7 days Follow-up with Dr. Alyse who is a hand specialist if your infection does not appear to be improving in the next day or 2 It may continue to drain pus or blood and that it is okay Return to the Emergency Department for fever severe pain or other concerns

## 2024-05-30 ENCOUNTER — Ambulatory Visit: Payer: Self-pay | Admitting: Family
